# Patient Record
Sex: Female | Born: 1960 | Race: Black or African American | Hispanic: No | Marital: Single | Smoking: Never smoker
Health system: Southern US, Community
[De-identification: ages and names within clinical notes are randomized; demographics above are authoritative.]

## PROBLEM LIST (undated history)

## (undated) ENCOUNTER — Ambulatory Visit (HOSPITAL_COMMUNITY): Payer: Medicaid Other

## (undated) DIAGNOSIS — H548 Legal blindness, as defined in USA: Secondary | ICD-10-CM

## (undated) DIAGNOSIS — H547 Unspecified visual loss: Secondary | ICD-10-CM

## (undated) DIAGNOSIS — E1139 Type 2 diabetes mellitus with other diabetic ophthalmic complication: Secondary | ICD-10-CM

## (undated) DIAGNOSIS — E119 Type 2 diabetes mellitus without complications: Secondary | ICD-10-CM

## (undated) DIAGNOSIS — I10 Essential (primary) hypertension: Secondary | ICD-10-CM

## (undated) HISTORY — PX: EYE SURGERY: SHX253

## (undated) HISTORY — DX: Unspecified visual loss: H54.7

## (undated) HISTORY — DX: Type 2 diabetes mellitus with other diabetic ophthalmic complication: E11.39

## (undated) HISTORY — DX: Legal blindness, as defined in USA: H54.8

## (undated) HISTORY — PX: RETINAL DETACHMENT SURGERY: SHX105

## (undated) HISTORY — DX: Type 2 diabetes mellitus without complications: E11.9

## (undated) HISTORY — PX: LASIK: SHX215

## (undated) HISTORY — DX: Essential (primary) hypertension: I10

---

## 2013-03-25 DIAGNOSIS — M79609 Pain in unspecified limb: Secondary | ICD-10-CM | POA: Insufficient documentation

## 2013-03-25 DIAGNOSIS — M2040 Other hammer toe(s) (acquired), unspecified foot: Secondary | ICD-10-CM | POA: Insufficient documentation

## 2013-03-25 DIAGNOSIS — B351 Tinea unguium: Secondary | ICD-10-CM | POA: Insufficient documentation

## 2013-03-25 DIAGNOSIS — M2010 Hallux valgus (acquired), unspecified foot: Secondary | ICD-10-CM | POA: Insufficient documentation

## 2013-03-25 DIAGNOSIS — L02629 Furuncle of unspecified foot: Secondary | ICD-10-CM | POA: Insufficient documentation

## 2013-03-25 DIAGNOSIS — L97509 Non-pressure chronic ulcer of other part of unspecified foot with unspecified severity: Secondary | ICD-10-CM | POA: Insufficient documentation

## 2013-04-24 DIAGNOSIS — M779 Enthesopathy, unspecified: Secondary | ICD-10-CM | POA: Insufficient documentation

## 2013-04-24 DIAGNOSIS — L989 Disorder of the skin and subcutaneous tissue, unspecified: Secondary | ICD-10-CM | POA: Insufficient documentation

## 2016-02-13 ENCOUNTER — Ambulatory Visit: Payer: Medicaid Other | Attending: Family Medicine | Admitting: Family Medicine

## 2016-02-13 ENCOUNTER — Encounter: Payer: Self-pay | Admitting: Family Medicine

## 2016-02-13 VITALS — BP 139/80 | HR 87 | Temp 98.4°F | Resp 20 | Ht 68.0 in | Wt 222.0 lb

## 2016-02-13 DIAGNOSIS — E119 Type 2 diabetes mellitus without complications: Secondary | ICD-10-CM | POA: Diagnosis not present

## 2016-02-13 DIAGNOSIS — H547 Unspecified visual loss: Secondary | ICD-10-CM | POA: Diagnosis not present

## 2016-02-13 DIAGNOSIS — E782 Mixed hyperlipidemia: Secondary | ICD-10-CM

## 2016-02-13 DIAGNOSIS — I10 Essential (primary) hypertension: Secondary | ICD-10-CM | POA: Insufficient documentation

## 2016-02-13 DIAGNOSIS — E559 Vitamin D deficiency, unspecified: Secondary | ICD-10-CM | POA: Insufficient documentation

## 2016-02-13 DIAGNOSIS — Z0000 Encounter for general adult medical examination without abnormal findings: Secondary | ICD-10-CM

## 2016-02-13 LAB — POCT UA - GLUCOSE/PROTEIN
Glucose, UA: 100
PROTEIN UA: 30

## 2016-02-13 LAB — POCT GLYCOSYLATED HEMOGLOBIN (HGB A1C): Hemoglobin A1C: 8

## 2016-02-13 LAB — GLUCOSE, POCT (MANUAL RESULT ENTRY): POC Glucose: 241 mg/dl — AB (ref 70–99)

## 2016-02-13 MED ORDER — ACCU-CHEK AVIVA PLUS W/DEVICE KIT: 1.0000 "application " | PACK | Freq: Three times a day (TID) | 0 refills | Status: DC

## 2016-02-13 MED ORDER — GLIMEPIRIDE 4 MG PO TABS: 4.0000 mg | ORAL_TABLET | Freq: Every day | ORAL | 3 refills | Status: DC

## 2016-02-13 MED ORDER — METFORMIN HCL 1000 MG PO TABS: 1000.0000 mg | ORAL_TABLET | Freq: Two times a day (BID) | ORAL | 2 refills | Status: DC

## 2016-02-13 MED ORDER — GLUCOSE BLOOD VI STRP: ORAL_STRIP | 12 refills | Status: DC

## 2016-02-13 MED ORDER — ACCU-CHEK SOFTCLIX LANCET DEV MISC: 0 refills | Status: DC

## 2016-02-13 MED ORDER — LISINOPRIL 20 MG PO TABS: 20.0000 mg | ORAL_TABLET | Freq: Every day | ORAL | 2 refills | Status: DC

## 2016-02-13 NOTE — Progress Notes (Signed)
f/u HTN/DM. C/o leg cramps.

## 2016-02-14 ENCOUNTER — Encounter: Payer: Self-pay | Admitting: Family Medicine

## 2016-02-14 DIAGNOSIS — E11319 Type 2 diabetes mellitus with unspecified diabetic retinopathy without macular edema: Secondary | ICD-10-CM | POA: Insufficient documentation

## 2016-02-14 DIAGNOSIS — Z794 Long term (current) use of insulin: Secondary | ICD-10-CM

## 2016-02-14 DIAGNOSIS — I10 Essential (primary) hypertension: Secondary | ICD-10-CM | POA: Insufficient documentation

## 2016-02-14 LAB — LIPID PANEL
CHOL/HDL RATIO: 6 ratio — AB (ref <5.0)
CHOLESTEROL: 193 mg/dL (ref <200)
HDL: 32 mg/dL — AB (ref >50)
TRIGLYCERIDES: 420 mg/dL — AB (ref <150)

## 2016-02-14 LAB — MICROALBUMIN / CREATININE URINE RATIO
Creatinine, Urine: 187 mg/dL (ref 20–320)
MICROALB UR: 9.8 mg/dL
Microalb Creat Ratio: 52 mcg/mg creat — ABNORMAL HIGH (ref <30)

## 2016-02-14 LAB — CBC WITH DIFFERENTIAL/PLATELET

## 2016-02-14 LAB — BASIC METABOLIC PANEL
BUN: 19 mg/dL (ref 7–25)
CALCIUM: 9 mg/dL (ref 8.6–10.4)
CO2: 25 mmol/L (ref 20–31)
Chloride: 100 mmol/L (ref 98–110)
Creat: 0.93 mg/dL (ref 0.50–1.05)
Glucose, Bld: 180 mg/dL — ABNORMAL HIGH (ref 65–99)
POTASSIUM: 3.9 mmol/L (ref 3.5–5.3)
SODIUM: 137 mmol/L (ref 135–146)

## 2016-02-14 LAB — VITAMIN D 25 HYDROXY (VIT D DEFICIENCY, FRACTURES): Vit D, 25-Hydroxy: 19 ng/mL — ABNORMAL LOW (ref 30–100)

## 2016-02-14 LAB — TSH: TSH: 1.1 m[IU]/L

## 2016-02-14 MED ORDER — GEMFIBROZIL 600 MG PO TABS: 600.0000 mg | ORAL_TABLET | Freq: Two times a day (BID) | ORAL | 2 refills | Status: DC

## 2016-02-14 MED ORDER — CALCIUM 600-400 MG-UNIT PO CHEW: 1.0000 | CHEWABLE_TABLET | Freq: Every day | ORAL | 2 refills | Status: DC

## 2016-02-14 NOTE — Progress Notes (Signed)
Subjective:   Patient ID: Lisa Olson, female    DOB: 1961-02-13, 54 y.o.   MRN: [id number]  Chief Complaint  Patient presents with  . Diabetes  . Hypertension   HPI Lisa Olson 55 y.o. female comes to the office to establish care and for medication refills. She has a PHM that includes HTN, DM, and blindness. She reports not having a glucometer at home to measure CBG's. She also reports not having a blood pressure monitor at home for BP monitoring.   Past Medical History:  Diagnosis Date  . Diabetes mellitus without complication (HCC)   . Hypertension   . 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  Social History   Social History  . Marital status: Single    Spouse name: N/A  . Number of children: N/A  . Years of education: N/A   Occupational History  . Patient reports working at [employer] for [employer].    Social History Main Topics  . Smoking status: Never Smoker  . Smokeless tobacco: Never Used  . Alcohol use Not on file  . Drug use: No  . Sexual activity: Not on file    No outpatient prescriptions prior to visit.   No facility-administered medications prior to visit.     No Known Allergies  Review of Systems  Constitutional: Negative.   HENT: Negative.   Eyes: Negative for pain, discharge and redness.       History visual impairment. Patient reports she can see shadows of light only.  Respiratory: Negative.   Cardiovascular: Negative.   Gastrointestinal: Negative.   Genitourinary: Negative.   Musculoskeletal: Negative.   Skin: Negative.   Neurological: Negative.   Psychiatric/Behavioral: Negative for depression. The patient is not nervous/anxious.        Objective:    Physical Exam  Constitutional: She is oriented to person, place, and time. She appears well-developed and well-nourished.  Eyes: Conjunctivae are normal. Right eye exhibits no discharge. Left eye exhibits no discharge.  Blindness.  Neck: Normal range  of motion.  Cardiovascular: Normal rate and regular rhythm.   Pulmonary/Chest: Effort normal and breath sounds normal.  Abdominal: Soft. Bowel sounds are normal.  Musculoskeletal: Normal range of motion. She exhibits no edema or tenderness.  Neurological: She is alert and oriented to person, place, and time.  Skin: Skin is warm and dry.  Psychiatric: She has a normal mood and affect. Her behavior is normal. Judgment and thought content normal.    BP 139/80   Pulse 87   Temp 98.4 F (36.9 C) (Oral)   Resp 20   Ht 5\' 8"  (1.727 m)   Wt 222 lb (100.7 kg)   SpO2 99%   BMI 33.75 kg/m  Wt Readings from Last 3 Encounters:  02/13/16 222 lb (100.7 kg)    Lab Results  Component Value Date   HGBA1C 8.0 02/13/2016       Assessment & Plan:   Problem List Items Addressed This Visit      Cardiovascular and Mediastinum   Essential hypertension   Relevant Medications   lisinopril (PRINIVIL,ZESTRIL) 20 MG tablet   gemfibrozil (LOPID) 600 MG tablet    Encouraged patient to use blood pressure monitor at retail pharmacy and keep log of BP's until she can  get one.   Other Relevant Orders   Basic Metabolic Panel (Completed)   CBC with Differential (Completed)   Urinalysis - Glucose/Protein (Completed)   Lipid Panel (Completed)  Vitamin D, 25-hydroxy (Completed)   Microalbumin/Creatinine Ratio, Urine (Completed)   TSH (Completed)     Endocrine   Type 2 diabetes mellitus without complication (HCC) - Primary   Relevant Medications   lisinopril (PRINIVIL,ZESTRIL) 20 MG tablet   metFORMIN (GLUCOPHAGE) 1000 MG tablet   glucose blood (ACCU-CHEK AVIVA) test strip   Lancet Devices (ACCU-CHEK SOFTCLIX) lancets   Blood Glucose Monitoring Suppl (ACCU-CHEK AVIVA PLUS) w/Device KIT   glimepiride (AMARYL) 4 MG tablet   Other Relevant Orders   POCT glucose (manual entry) (Completed)   HgB A1c (Completed)   Basic Metabolic Panel (Completed)   CBC with Differential (Completed)   Urinalysis -  Glucose/Protein (Completed)   Vitamin D, 25-hydroxy (Completed)   Microalbumin/Creatinine Ratio, Urine (Completed)   Ambulatory referral to Podiatry   Ambulatory referral to Ophthalmology    Other Visit Diagnoses    Healthcare maintenance       Mixed hyperlipidemia       Relevant Medications   lisinopril (PRINIVIL,ZESTRIL) 20 MG tablet   gemfibrozil (LOPID) 600 MG tablet   Vitamin D deficiency       Relevant Medications   Calcium 600-400 MG-UNIT CHEW      I have discontinued Ms. Coldwell's glyBURIDE. I have also changed her lisinopril and metFORMIN. Additionally, I am having her start on glucose blood, accu-chek softclix, ACCU-CHEK AVIVA PLUS, glimepiride, gemfibrozil, and Calcium.  Meds ordered this encounter  Medications  . DISCONTD: metFORMIN (GLUCOPHAGE) 1000 MG tablet    Sig: Take 1,000 mg by mouth 2 (two) times daily with a meal.  . DISCONTD: lisinopril (PRINIVIL,ZESTRIL) 20 MG tablet    Sig: Take 20 mg by mouth daily.  Marland Kitchen DISCONTD: glyBURIDE (DIABETA) 5 MG tablet    Sig: Take 4 mg by mouth daily with breakfast.  . lisinopril (PRINIVIL,ZESTRIL) 20 MG tablet    Sig: Take 1 tablet (20 mg total) by mouth daily.    Dispense:  30 tablet    Refill:  2    Order Specific Question:   Supervising Provider    Answer:   Tresa Garter [id number]  . metFORMIN (GLUCOPHAGE) 1000 MG tablet    Sig: Take 1 tablet (1,000 mg total) by mouth 2 (two) times daily with a meal.    Dispense:  60 tablet    Refill:  2    Order Specific Question:   Supervising Provider    Answer:   Tresa Garter [id number]  . glucose blood (ACCU-CHEK AVIVA) test strip    Sig: Use as instructed    Dispense:  100 each    Refill:  12    Order Specific Question:   Supervising Provider    Answer:   Tresa Garter [[id number]]  . Lancet Devices (ACCU-CHEK SOFTCLIX) lancets    Sig: Use as instructed    Dispense:  1 each    Refill:  0    Order Specific Question:   Supervising Provider    Answer:   Tresa Garter [id number]  . Blood Glucose Monitoring Suppl (ACCU-CHEK AVIVA PLUS) w/Device KIT    Sig: 1 application by Does not apply route 4 (four) times daily -  before meals and at bedtime.    Dispense:  1 kit    Refill:  0    Order Specific Question:   Supervising Provider    Answer:   Tresa Garter [id number]  . glimepiride (AMARYL) 4 MG tablet    Sig: Take 1 tablet (4 mg total) by mouth  daily before breakfast.    Dispense:  30 tablet    Refill:  3    Order Specific Question:   Supervising Provider    Answer:   Tresa Garter [id number]  . gemfibrozil (LOPID) 600 MG tablet    Sig: Take 1 tablet (600 mg total) by mouth 2 (two) times daily before a meal.    Dispense:  60 tablet    Refill:  2    Order Specific Question:   Supervising Provider    Answer:   Tresa Garter [id number]  . Calcium 600-400 MG-UNIT CHEW    Sig: Chew 1 tablet by mouth daily.    Dispense:  60 tablet    Refill:  2    Order Specific Question:   Supervising Provider    Answer:   Tresa Garter [id number]   Follow up: 3 months  Fredia Beets, FNP

## 2016-02-15 ENCOUNTER — Encounter: Payer: Self-pay | Admitting: Family Medicine

## 2016-02-15 ENCOUNTER — Telehealth: Payer: Self-pay

## 2016-02-15 NOTE — Telephone Encounter (Addendum)
Patient fully hipaa verified per guidelines. RN advised patient per Lisa BarkMandesia R Hairston, FNP -Microalbumin/creatinine ratio level was high. This tests for protein in your urine that can indicate early signs of kidney damage.  Continue to take lisinopril. Will recheck levels again in 3 months. -Triglycerides level was high. High Triglycerides can increase your risk of heart disease. Lopid was prescribed for you to lower  your levels. Will recheck levels again in 3 months. -Vitamin D level was low. Vitamin D helps to keep bones strong. You were prescribed a vitamin-d supplement to increase your  levels. -Will need to recollect CBC lab at next visit due to clotted specimen. - Hgb A1c was 8.0. Continue Eat a carbohydrate modified, low salt diet.  -[facility].   Patient verbalized understanding.  No further questions at this time.

## 2016-02-15 NOTE — Telephone Encounter (Signed)
Requesting referral Home health aide to assist with daily activities

## 2016-02-15 NOTE — Progress Notes (Signed)
Call patient requested referral for [facility]: [phone] in [address],[address], [place], [place] [id number].

## 2016-02-15 NOTE — Telephone Encounter (Signed)
Order was placed for referral to Home Health.

## 2016-02-20 ENCOUNTER — Other Ambulatory Visit: Payer: Self-pay | Admitting: Pharmacist

## 2016-02-20 DIAGNOSIS — I10 Essential (primary) hypertension: Secondary | ICD-10-CM

## 2016-02-20 DIAGNOSIS — E119 Type 2 diabetes mellitus without complications: Secondary | ICD-10-CM

## 2016-02-20 MED ORDER — ACCU-CHEK SOFTCLIX LANCET DEV MISC: 0 refills | Status: DC

## 2016-02-20 MED ORDER — LISINOPRIL 20 MG PO TABS: 20.0000 mg | ORAL_TABLET | Freq: Every day | ORAL | 2 refills | Status: DC

## 2016-02-20 MED ORDER — METFORMIN HCL 1000 MG PO TABS: 1000.0000 mg | ORAL_TABLET | Freq: Two times a day (BID) | ORAL | 2 refills | Status: DC

## 2016-02-20 MED ORDER — GLUCOSE BLOOD VI STRP: ORAL_STRIP | 12 refills | Status: DC

## 2016-02-22 ENCOUNTER — Telehealth: Payer: Self-pay | Admitting: Family Medicine

## 2016-02-22 ENCOUNTER — Ambulatory Visit: Payer: Self-pay | Admitting: Podiatry

## 2016-02-22 NOTE — Telephone Encounter (Signed)
Patient states she has a prodigy meter and needs lancets and strips for this meter....  And needs refill for glimepiride (AMARYL) 4 MG tablet [[id number]][[id number]]   Please send [facility] on [address]....  Please follow up

## 2016-02-22 NOTE — Telephone Encounter (Signed)
At her last visit on 11/27 I gave her 3 refills on her glimepiride and I also placed orders for her to be able to obtain a glucose meter, with strips and lancets from our pharmacy.

## 2016-02-23 ENCOUNTER — Other Ambulatory Visit: Payer: Self-pay | Admitting: Pharmacist

## 2016-02-23 ENCOUNTER — Other Ambulatory Visit: Payer: Self-pay | Admitting: Family Medicine

## 2016-02-23 DIAGNOSIS — E782 Mixed hyperlipidemia: Secondary | ICD-10-CM

## 2016-02-23 DIAGNOSIS — E559 Vitamin D deficiency, unspecified: Secondary | ICD-10-CM

## 2016-02-23 DIAGNOSIS — E119 Type 2 diabetes mellitus without complications: Secondary | ICD-10-CM

## 2016-02-23 DIAGNOSIS — I10 Essential (primary) hypertension: Secondary | ICD-10-CM

## 2016-02-23 MED ORDER — GEMFIBROZIL 600 MG PO TABS: 600.0000 mg | ORAL_TABLET | Freq: Two times a day (BID) | ORAL | 2 refills | Status: DC

## 2016-02-23 MED ORDER — LISINOPRIL 20 MG PO TABS: 20.0000 mg | ORAL_TABLET | Freq: Every day | ORAL | 2 refills | Status: DC

## 2016-02-23 MED ORDER — PRODIGY SAFETY LANCETS 26G MISC: 1.0000 | Freq: Once | 12 refills | Status: AC

## 2016-02-23 MED ORDER — GLUCOSE BLOOD VI STRP: ORAL_STRIP | 12 refills | Status: DC

## 2016-02-23 MED ORDER — CALCIUM 600-400 MG-UNIT PO CHEW: 1.0000 | CHEWABLE_TABLET | Freq: Every day | ORAL | 2 refills | Status: DC

## 2016-02-23 MED ORDER — METFORMIN HCL 1000 MG PO TABS: 1000.0000 mg | ORAL_TABLET | Freq: Two times a day (BID) | ORAL | 2 refills | Status: DC

## 2016-02-23 MED ORDER — GLIMEPIRIDE 4 MG PO TABS: 4.0000 mg | ORAL_TABLET | Freq: Every day | ORAL | 3 refills | Status: DC

## 2016-02-29 ENCOUNTER — Ambulatory Visit: Payer: Self-pay | Admitting: Podiatry

## 2016-03-01 ENCOUNTER — Telehealth: Payer: Self-pay | Admitting: Family Medicine

## 2016-03-01 NOTE — Telephone Encounter (Signed)
Patient called the office to speak with PCP regarding her meter. Pt stated that she has the Prodigy meter and therefore the strips that were sent to the pharmacy are wrong. Please follow up.  Thank you.

## 2016-03-02 ENCOUNTER — Other Ambulatory Visit: Payer: Self-pay | Admitting: Family Medicine

## 2016-03-02 MED ORDER — GLUCOSE BLOOD VI STRP: ORAL_STRIP | 12 refills | Status: DC

## 2016-03-02 NOTE — Telephone Encounter (Signed)
There were two different types of Prodigy strips listed within EPIC. I have sent the Prodigy No Coding strip [facility].'s chart.

## 2016-03-05 NOTE — Telephone Encounter (Signed)
Patient called the office to inform us that she went to pick up the new strips but that she had to pay out of pocket because her insurance won't cover it. Pharmacy informed her that Medicaid will cover Accu Check meter. Pt is now requesting one. [facility] on [address]. Please follow up.   Thank you.

## 2016-03-06 MED ORDER — GLUCOSE BLOOD VI STRP: ORAL_STRIP | 12 refills | Status: DC

## 2016-03-06 MED ORDER — ACCU-CHEK SOFTCLIX LANCETS MISC: 12 refills | Status: DC

## 2016-03-06 MED ORDER — ACCU-CHEK AVIVA PLUS W/DEVICE KIT: PACK | 0 refills | Status: DC

## 2016-03-06 MED ORDER — ACCU-CHEK SOFTCLIX LANCET DEV MISC: 0 refills | Status: DC

## 2016-03-06 NOTE — Telephone Encounter (Signed)
Accu-chek blood glucose monitoring supplies sent to [facility].

## 2016-03-14 ENCOUNTER — Encounter: Payer: Self-pay | Admitting: Podiatry

## 2016-03-14 ENCOUNTER — Ambulatory Visit (INDEPENDENT_AMBULATORY_CARE_PROVIDER_SITE_OTHER): Payer: Medicaid Other | Admitting: Podiatry

## 2016-03-14 VITALS — BP 145/88 | HR 97 | Resp 18

## 2016-03-14 DIAGNOSIS — B351 Tinea unguium: Secondary | ICD-10-CM | POA: Diagnosis not present

## 2016-03-14 DIAGNOSIS — M79675 Pain in left toe(s): Secondary | ICD-10-CM | POA: Diagnosis not present

## 2016-03-14 DIAGNOSIS — M79674 Pain in right toe(s): Secondary | ICD-10-CM | POA: Diagnosis not present

## 2016-03-14 NOTE — Progress Notes (Signed)
   Subjective:    Patient ID: Lisa Olson, female    DOB: [birth year], 55 y.o.   MRN: [id number]  HPI     This patient presents today requesting trimming of her toenails that are are extremely uncomfortable walking wearing shoes. Patient describes some previous podiatric care approximately a year ago, however, denies any recent care or self-care. She also describes a general soreness, tenderness, burning, tingling and throbbing on and off weightbearing for many months. She denies any specific treatment for these symptoms  Patient is a diabetic and denies any history of foot ulceration, claudication or amputation    Review of Systems  Eyes:       Legally blind  All other systems reviewed and are negative.      Objective:   Physical Exam  Orientated 3  Vascular: DP and PT pulses 2/4 bilaterally Capillary reflex immediate bilaterally  Neurological: Sensation to 10 g monofilament wire intact 4/5 right and 3/5 left Vibratory sensation reactive bilaterally Ankle reflexes reactive bilaterally  Dermatological: No open skin lesions bilaterally The toenails are extremely elongated curvilinear around the distal toes with hypertrophy, texture and color changes and tenderness to direct palpation  Musculoskeletal: Manual motor testing dorsi flexion, plantar flexion, inversion, eversion 5/5 bilaterally Patient appears to have stable gait      Assessment & Plan:   Assessment: Diabetic with symptoms of peripheral neuropathy Symptomatic onychomycoses 6-10  Plan: I reviewed the results of exam with patient today recommended debridement of toenails. Patient verbally consents. The toenails 6-10 were debrided mechanically and elect without any bleeding  Reappoint at three-month intervals Please note patient at discharge asked my assistant about diabetic shoes.

## 2016-03-14 NOTE — Patient Instructions (Signed)

## 2016-04-06 ENCOUNTER — Other Ambulatory Visit: Payer: Self-pay | Admitting: *Deleted

## 2016-04-06 DIAGNOSIS — E119 Type 2 diabetes mellitus without complications: Secondary | ICD-10-CM

## 2016-04-06 MED ORDER — GLIMEPIRIDE 4 MG PO TABS: 4.0000 mg | ORAL_TABLET | Freq: Every day | ORAL | 3 refills | Status: DC

## 2016-04-06 NOTE — Telephone Encounter (Signed)
Patient verified DOB Patient is aware of glimepiride being refilled and patient reiterated the instructions of taking 1 times before breakfast. MA informed PCP of patient taking 2 tablets daily. Patient advised to come in for a medication management with stacey. Patient will call Monday to have better knowledge of her work schedule.

## 2016-05-14 ENCOUNTER — Ambulatory Visit: Payer: Medicaid Other | Attending: Family Medicine | Admitting: Family Medicine

## 2016-05-14 VITALS — BP 142/79 | HR 86 | Temp 98.3°F | Resp 18 | Ht 67.0 in | Wt 213.4 lb

## 2016-05-14 DIAGNOSIS — B351 Tinea unguium: Secondary | ICD-10-CM | POA: Insufficient documentation

## 2016-05-14 DIAGNOSIS — Z7984 Long term (current) use of oral hypoglycemic drugs: Secondary | ICD-10-CM | POA: Insufficient documentation

## 2016-05-14 DIAGNOSIS — M62830 Muscle spasm of back: Secondary | ICD-10-CM

## 2016-05-14 DIAGNOSIS — E11319 Type 2 diabetes mellitus with unspecified diabetic retinopathy without macular edema: Secondary | ICD-10-CM | POA: Diagnosis not present

## 2016-05-14 DIAGNOSIS — E118 Type 2 diabetes mellitus with unspecified complications: Secondary | ICD-10-CM

## 2016-05-14 DIAGNOSIS — H547 Unspecified visual loss: Secondary | ICD-10-CM | POA: Insufficient documentation

## 2016-05-14 LAB — HEPATIC FUNCTION PANEL
ALK PHOS: 61 U/L (ref 33–130)
ALT: 13 U/L (ref 6–29)
AST: 17 U/L (ref 10–35)
Albumin: 4 g/dL (ref 3.6–5.1)
BILIRUBIN INDIRECT: 0.2 mg/dL (ref 0.2–1.2)
BILIRUBIN TOTAL: 0.3 mg/dL (ref 0.2–1.2)
Bilirubin, Direct: 0.1 mg/dL (ref <=0.2)
TOTAL PROTEIN: 7.7 g/dL (ref 6.1–8.1)

## 2016-05-14 LAB — POCT GLYCOSYLATED HEMOGLOBIN (HGB A1C): HEMOGLOBIN A1C: 6.9

## 2016-05-14 LAB — GLUCOSE, POCT (MANUAL RESULT ENTRY): POC GLUCOSE: 121 mg/dL — AB (ref 70–99)

## 2016-05-14 LAB — LIPID PANEL
Cholesterol: 155 mg/dL (ref <200)
HDL: 33 mg/dL — AB (ref >50)
LDL CALC: 78 mg/dL (ref <100)
Total CHOL/HDL Ratio: 4.7 Ratio (ref <5.0)
Triglycerides: 222 mg/dL — ABNORMAL HIGH (ref <150)
VLDL: 44 mg/dL — ABNORMAL HIGH (ref <30)

## 2016-05-14 MED ORDER — GLUCOSE BLOOD VI STRP: ORAL_STRIP | 12 refills | Status: DC

## 2016-05-14 MED ORDER — ACCU-CHEK SOFTCLIX LANCETS MISC: 12 refills | Status: DC

## 2016-05-14 MED ORDER — GLIMEPIRIDE 4 MG PO TABS: 4.0000 mg | ORAL_TABLET | Freq: Every day | ORAL | 2 refills | Status: DC

## 2016-05-14 MED ORDER — LISINOPRIL 30 MG PO TABS: 30.0000 mg | ORAL_TABLET | Freq: Every day | ORAL | 2 refills | Status: DC

## 2016-05-14 MED ORDER — ACCU-CHEK AVIVA PLUS W/DEVICE KIT: PACK | 0 refills | Status: DC

## 2016-05-14 MED ORDER — CYCLOBENZAPRINE HCL 5 MG PO TABS: 10.0000 mg | ORAL_TABLET | Freq: Three times a day (TID) | ORAL | 0 refills | Status: DC | PRN

## 2016-05-14 NOTE — Progress Notes (Signed)
Patient is here for back pain & discomfort when eating   Patient has eaten today and has taking her meds today

## 2016-05-14 NOTE — Progress Notes (Signed)
Subjective:  Patient ID: Lisa Olson, female    DOB: 1960-04-08  Age: 56 y.o. MRN: [id number]  CC: No chief complaint on file.   HPI Leeann Bady presents for   DM: Report being compliant with taking her diabetic medications and recently incorporating exercise. Denies any episodes of hypoglycemia. History of diabetic retinopathy impairment. Difficulty being able to afford her diabetic supplies for her current meter .  Back spasm: For over a week. She denies any history of back injury or pain. She denies any paresthesias.   Outpatient Medications Prior to Visit  Medication Sig Dispense Refill  . Calcium 600-400 MG-UNIT CHEW Chew 1 tablet by mouth daily. 60 tablet 2  . Lancet Devices (ACCU-CHEK SOFTCLIX) lancets Use as instructed 1 each 0  . metFORMIN (GLUCOPHAGE) 1000 MG tablet Take 1 tablet (1,000 mg total) by mouth 2 (two) times daily with a meal. 60 tablet 2  . ACCU-CHEK SOFTCLIX LANCETS lancets Use as instructed 100 each 12  . Blood Glucose Monitoring Suppl (ACCU-CHEK AVIVA PLUS) w/Device KIT Use as directed 1 kit 0  . gemfibrozil (LOPID) 600 MG tablet Take 1 tablet (600 mg total) by mouth 2 (two) times daily before a meal. 60 tablet 2  . glimepiride (AMARYL) 4 MG tablet Take 1 tablet (4 mg total) by mouth daily before breakfast. 30 tablet 3  . glucose blood (ACCU-CHEK AVIVA PLUS) test strip Use as instructed 100 each 12  . lisinopril (PRINIVIL,ZESTRIL) 20 MG tablet Take 1 tablet (20 mg total) by mouth daily. 30 tablet 2   No facility-administered medications prior to visit.     ROS Review of Systems  Eyes:       History of diabetic retinopathy w/ blindness.  Respiratory: Negative.   Cardiovascular: Negative.   Gastrointestinal: Negative.   Endocrine: Negative.   Skin: Negative.      Objective:  BP (!) 142/79 (BP Location: Left Arm, Patient Position: Sitting, Cuff Size: Normal)   Pulse 86   Temp 98.3 F (36.8 C) (Oral)   Resp 18   Ht 5' 7"  (1.702 m)   Wt 213 lb 6.4 oz  (96.8 kg)   SpO2 97%   BMI 33.42 kg/m   BP/Weight 05/14/2016 03/14/2016 51/70/0174  Systolic BP 944 967 591  Diastolic BP 79 88 80  Wt. (Lbs) 213.4 - 222  BMI 33.42 - 33.75     Physical Exam  Eyes:  Bilateral blindness  Cardiovascular: Normal rate, regular rhythm, normal heart sounds and intact distal pulses.   Pulmonary/Chest: Effort normal and breath sounds normal.  Abdominal: Soft. Bowel sounds are normal.  Skin: Skin is warm and dry.  Thickened, brown discolored toenails.   Nursing note and vitals reviewed.   Diabetic Foot Exam - Simple   Simple Foot Form Diabetic Foot exam was performed with the following findings:  Yes 05/14/2016  3:46 PM  Visual Inspection See comments:  Yes Sensation Testing Intact to touch and monofilament testing bilaterally:  Yes Pulse Check Posterior Tibialis and Dorsalis pulse intact bilaterally:  Yes Comments Thickened, brown discolored toenails.     Assessment & Plan:   Problem List Items Addressed This Visit    None    Visit Diagnoses    Controlled type 2 diabetes mellitus with complication, without long-term current use of insulin ([place])    -  Primary   Relevant Medications   lisinopril (PRINIVIL,ZESTRIL) 30 MG tablet   glucose blood (ACCU-CHEK AVIVA PLUS) test strip   Blood Glucose Monitoring Suppl (ACCU-CHEK AVIVA  PLUS) w/Device KIT   ACCU-CHEK SOFTCLIX LANCETS lancets   glimepiride (AMARYL) 4 MG tablet   Other Relevant Orders   Lipid Panel (Completed)   Hepatic Function Panel (Completed)   Microalbumin/Creatinine Ratio, Urine (Completed)   HgB A1c (Completed)   Glucose (CBG) (Completed)   Muscle spasm of back       Relevant Medications   cyclobenzaprine (FLEXERIL) 5 MG tablet   Onychomycosis       Relevant Orders   Lipid Panel (Completed)   Hepatic Function Panel (Completed)      Meds ordered this encounter  Medications  . cyclobenzaprine (FLEXERIL) 5 MG tablet    Sig: Take 2 tablets (10 mg total) by mouth 3  (three) times daily as needed for muscle spasms.    Dispense:  30 tablet    Refill:  0    Order Specific Question:   Supervising Provider    Answer:   Tresa Garter [id number]  . lisinopril (PRINIVIL,ZESTRIL) 30 MG tablet    Sig: Take 1 tablet (30 mg total) by mouth daily.    Dispense:  30 tablet    Refill:  2    Order Specific Question:   Supervising Provider    Answer:   Tresa Garter [id number]  . glucose blood (ACCU-CHEK AVIVA PLUS) test strip    Sig: Use as instructed    Dispense:  100 each    Refill:  12    Order Specific Question:   Supervising Provider    Answer:   Tresa Garter [[id number]]  . Blood Glucose Monitoring Suppl (ACCU-CHEK AVIVA PLUS) w/Device KIT    Sig: Use as directed    Dispense:  1 kit    Refill:  0    Order Specific Question:   Supervising Provider    Answer:   Tresa Garter [id number]  . ACCU-CHEK SOFTCLIX LANCETS lancets    Sig: Use as instructed    Dispense:  100 each    Refill:  12    Order Specific Question:   Supervising Provider    Answer:   Tresa Garter [[id number]]  . DISCONTD: glimepiride (AMARYL) 4 MG tablet    Sig: Take 1 tablet (4 mg total) by mouth daily with breakfast.    Dispense:  30 tablet    Refill:  2    Order Specific Question:   Supervising Provider    Answer:   Tresa Garter [id number]  . glimepiride (AMARYL) 4 MG tablet    Sig: Take 1 tablet (4 mg total) by mouth daily with breakfast.    Dispense:  30 tablet    Refill:  2    Order Specific Question:   Supervising Provider    Answer:   Tresa Garter [[id number]]    Follow-up: Return in about 3 months (around 08/11/2016) for Diabetes.   Alfonse Spruce FNP

## 2016-05-14 NOTE — Patient Instructions (Addendum)
If liver function results are Lasimil will be prescribed.   Fungal Nail Infection Introduction Fungal nail infection is a common fungal infection of the toenails or fingernails. This condition affects toenails more often than fingernails. More than one nail may be infected. The condition can be passed from person to person (is contagious). What are the causes? This condition is caused by a fungus. Several types of funguses can cause the infection. These funguses are common in moist and warm areas. If your hands or feet come into contact with the fungus, it may get into a crack in your fingernail or toenail and cause the infection. What increases the risk? The following factors may make you more likely to develop this condition:  Being female.  Having diabetes.  Being of older age.  Living with someone who has the fungus.  Walking barefoot in areas where the fungus thrives, such as showers or locker rooms.  Having poor circulation.  Wearing shoes and socks that cause your feet to sweat.  Having athlete's foot.  Having a nail injury or history of a recent nail surgery.  Having psoriasis.  Having a weak body defense system (immune system). What are the signs or symptoms? Symptoms of this condition include:  A pale spot on the nail.  Thickening of the nail.  A nail that becomes yellow or brown.  A brittle or ragged nail edge.  A crumbling nail.  A nail that has lifted away from the nail bed. How is this diagnosed? This condition is diagnosed with a physical exam. Your health care provider may take a scraping or clipping from your nail to test for the fungus. How is this treated? Mild infections do not need treatment. If you have significant nail changes, treatment may include:  Oral antifungal medicines. You may need to take the medicine for several weeks or several months, and you may not see the results for a long time. These medicines can cause side effects. Ask your  health care provider what problems to watch for.  Antifungal nail polish and nail cream. These may be used along with oral antifungal medicines.  Laser treatment of the nail.  Surgery to remove the nail. This may be needed for the most severe infections. Treatment takes a long time, and the infection may come back. Follow these instructions at home: Medicines  Take or apply over-the-counter and prescription medicines only as told by your health care provider.  Ask your health care provider about using over-the-counter mentholated ointment on your nails. Lifestyle  Do not share personal items, such as towels or nail clippers.  Trim your nails often.  Wash and dry your hands and feet every day.  Wear absorbent socks, and change your socks frequently.  Wear shoes that allow air to circulate, such as sandals or canvas tennis shoes. Throw out old shoes.  Wear rubber gloves if you are working with your hands in wet areas.  Do not walk barefoot in shower rooms or locker rooms.  Do not use a nail salon that does not use clean instruments.  Do not use artificial nails. General instructions  Keep all follow-up visits as told by your health care provider. This is important.  Use antifungal foot powder on your feet and in your shoes. Contact a health care provider if: Your infection is not getting better or it is getting worse after several months. This information is not intended to replace advice given to you by your health care provider. Make sure you  discuss any questions you have with your health care provider. Document Released: 03/02/2000 Document Revised: 08/11/2015 Document Reviewed: 09/06/2014  2017 Elsevier Cyclobenzaprine tablets What is this medicine? CYCLOBENZAPRINE (sye kloe BEN za preen) is a muscle relaxer. It is used to treat muscle pain, spasms, and stiffness. This medicine may be used for other purposes; ask your health care provider or pharmacist if you have  questions. COMMON BRAND NAME(S): Fexmid, Flexeril What should I tell my health care provider before I take this medicine? They need to know if you have any of these conditions: -heart disease, irregular heartbeat, or previous heart attack -liver disease -thyroid problem -an unusual or allergic reaction to cyclobenzaprine, tricyclic antidepressants, lactose, other medicines, foods, dyes, or preservatives -pregnant or trying to get pregnant -breast-feeding How should I use this medicine? Take this medicine by mouth with a glass of water. Follow the directions on the prescription label. If this medicine upsets your stomach, take it with food or milk. Take your medicine at regular intervals. Do not take it more often than directed. Talk to your pediatrician regarding the use of this medicine in children. Special care may be needed. Overdosage: If you think you have taken too much of this medicine contact a poison control center or emergency room at once. NOTE: This medicine is only for you. Do not share this medicine with others. What if I miss a dose? If you miss a dose, take it as soon as you can. If it is almost time for your next dose, take only that dose. Do not take double or extra doses. What may interact with this medicine? Do not take this medicine with any of the following medications: -certain medicines for fungal infections like fluconazole, itraconazole, ketoconazole, posaconazole, voriconazole -cisapride -dofetilide -dronedarone -halofantrine -levomethadyl -MAOIs like Carbex, Eldepryl, Marplan, Nardil, and Parnate -narcotic medicines for cough -pimozide -thioridazine -ziprasidone This medicine may also interact with the following medications: -alcohol -antihistamines for allergy, cough and cold -certain medicines for anxiety or sleep -certain medicines for cancer -certain medicines for depression like amitriptyline, fluoxetine, sertraline -certain medicines for infection  like alfuzosin, chloroquine, clarithromycin, levofloxacin, mefloquine, pentamidine, troleandomycin -certain medicines for irregular heart beat -certain medicines for seizures like phenobarbital, primidone -contrast dyes -general anesthetics like halothane, isoflurane, methoxyflurane, propofol -local anesthetics like lidocaine, pramoxine, tetracaine -medicines that relax muscles for surgery -narcotic medicines for pain -other medicines that prolong the QT interval (cause an abnormal heart rhythm) -phenothiazines like chlorpromazine, mesoridazine, prochlorperazine This list may not describe all possible interactions. Give your health care provider a list of all the medicines, herbs, non-prescription drugs, or dietary supplements you use. Also tell them if you smoke, drink alcohol, or use illegal drugs. Some items may interact with your medicine. What should I watch for while using this medicine? Tell your doctor or health care professional if your symptoms do not start to get better or if they get worse. You may get drowsy or dizzy. Do not drive, use machinery, or do anything that needs mental alertness until you know how this medicine affects you. Do not stand or sit up quickly, especially if you are an older patient. This reduces the risk of dizzy or fainting spells. Alcohol may interfere with the effect of this medicine. Avoid alcoholic drinks. If you are taking another medicine that also causes drowsiness, you may have more side effects. Give your health care provider a list of all medicines you use. Your doctor will tell you how much medicine to take. Do not take  more medicine than directed. Call emergency for help if you have problems breathing or unusual sleepiness. Your mouth may get dry. Chewing sugarless gum or sucking hard candy, and drinking plenty of water may help. Contact your doctor if the problem does not go away or is severe. What side effects may I notice from receiving this  medicine? Side effects that you should report to your doctor or health care professional as soon as possible: -allergic reactions like skin rash, itching or hives, swelling of the face, lips, or tongue -breathing problems -chest pain -fast, irregular heartbeat -hallucinations -seizures -unusually weak or tired Side effects that usually do not require medical attention (report to your doctor or health care professional if they continue or are bothersome): -headache -nausea, vomiting This list may not describe all possible side effects. Call your doctor for medical advice about side effects. You may report side effects to FDA at 1-800-FDA-1088. Where should I keep my medicine? Keep out of the reach of children. Store at room temperature between 15 and 30 degrees C (59 and 86 degrees F). Keep container tightly closed. Throw away any unused medicine after the expiration date. NOTE: This sheet is a summary. It may not cover all possible information. If you have questions about this medicine, talk to your doctor, pharmacist, or health care provider.  2017 Elsevier/Gold Standard (2014-12-14 12:05:46)  Muscle Cramps and Spasms Muscle cramps and spasms are when muscles tighten by themselves. They usually get better within minutes. Muscle cramps are painful. They are usually stronger and last longer than muscle spasms. Muscle spasms may or may not be painful. They can last a few seconds or much longer. HOME CARE  Drink enough fluid to keep your pee (urine) clear or pale yellow.  Massage, stretch, and relax the muscle.  Use a warm towel, heating pad, or warm shower water on tight muscles.  Place ice on the muscle if it is tender or in pain.  Put ice in a plastic bag.  Place a towel between your skin and the bag.  Leave the ice on for 15-20 minutes, 3-4 times a day.  Only take medicine as told by your doctor. GET HELP RIGHT AWAY IF:  Your cramps or spasms get worse, happen more often, or  do not get better with time. MAKE SURE YOU:  Understand these instructions.  Will watch your condition.  Will get help right away if you are not doing well or get worse. This information is not intended to replace advice given to you by your health care provider. Make sure you discuss any questions you have with your health care provider. Document Released: 02/16/2008 Document Revised: 06/30/2012 Document Reviewed: 12/07/2014 Elsevier Interactive Patient Education  2017 Else[employer] Inc.  Lisinopril tablets What is this medicine? LISINOPRIL (lyse IN oh pril) is an ACE inhibitor. This medicine is used to treat high blood pressure and heart failure. It is also used to protect the heart immediately after a heart attack. This medicine may be used for other purposes; ask your health care provider or pharmacist if you have questions. COMMON BRAND NAME(S): Prinivil, Zestril What should I tell my health care provider before I take this medicine? They need to know if you have any of these conditions: -diabetes -heart or blood vessel disease -kidney disease -low blood pressure -previous swelling of the tongue, face, or lips with difficulty breathing, difficulty swallowing, hoarseness, or tightening of the throat -an unusual or allergic reaction to lisinopril, other ACE inhibitors, insect venom, foods,  dyes, or preservatives -pregnant or trying to get pregnant -breast-feeding How should I use this medicine? Take this medicine by mouth with a glass of water. Follow the directions on your prescription label. You may take this medicine with or without food. If it upsets your stomach, take it with food. Take your medicine at regular intervals. Do not take it more often than directed. Do not stop taking except on your doctor's advice. Talk to your pediatrician regarding the use of this medicine in children. Special care may be needed. While this drug may be prescribed for children as young as 6 years of  age for selected conditions, precautions do apply. Overdosage: If you think you have taken too much of this medicine contact a poison control center or emergency room at once. NOTE: This medicine is only for you. Do not share this medicine with others. What if I miss a dose? If you miss a dose, take it as soon as you can. If it is almost time for your next dose, take only that dose. Do not take double or extra doses. What may interact with this medicine? Do not take this medicine with any of the following medications: -hymenoptera venom -sacubitril; valsartan This medicines may also interact with the following medications: -aliskiren -angiotensin receptor blockers, like losartan or valsartan -certain medicines for diabetes -diuretics -everolimus -gold compounds -lithium -NSAIDs, medicines for pain and inflammation, like ibuprofen or naproxen -potassium salts or supplements -salt substitutes -sirolimus -temsirolimus This list may not describe all possible interactions. Give your health care provider a list of all the medicines, herbs, non-prescription drugs, or dietary supplements you use. Also tell them if you smoke, drink alcohol, or use illegal drugs. Some items may interact with your medicine. What should I watch for while using this medicine? Visit your doctor or health care professional for regular check ups. Check your blood pressure as directed. Ask your doctor what your blood pressure should be, and when you should contact him or her. Do not treat yourself for coughs, colds, or pain while you are using this medicine without asking your doctor or health care professional for advice. Some ingredients may increase your blood pressure. Women should inform their doctor if they wish to become pregnant or think they might be pregnant. There is a potential for serious side effects to an unborn child. Talk to your health care professional or pharmacist for more information. Check with your  doctor or health care professional if you get an attack of severe diarrhea, nausea and vomiting, or if you sweat a lot. The loss of too much body fluid can make it dangerous for you to take this medicine. You may get drowsy or dizzy. Do not drive, use machinery, or do anything that needs mental alertness until you know how this drug affects you. Do not stand or sit up quickly, especially if you are an older patient. This reduces the risk of dizzy or fainting spells. Alcohol can make you more drowsy and dizzy. Avoid alcoholic drinks. Avoid salt substitutes unless you are told otherwise by your doctor or health care professional. What side effects may I notice from receiving this medicine? Side effects that you should report to your doctor or health care professional as soon as possible: -allergic reactions like skin rash, itching or hives, swelling of the hands, feet, face, lips, throat, or tongue -breathing problems -signs and symptoms of kidney injury like trouble passing urine or change in the amount of urine -signs and symptoms of increased  potassium like muscle weakness; chest pain; or fast, irregular heartbeat -signs and symptoms of liver injury like dark yellow or brown urine; general ill feeling or flu-like symptoms; light-colored stools; loss of appetite; nausea; right upper belly pain; unusually weak or tired; yellowing of the eyes or skin -signs and symptoms of low blood pressure like dizziness; feeling faint or lightheaded, falls; unusually weak or tired -stomach pain with or without nausea and vomiting Side effects that usually do not require medical attention (report to your doctor or health care professional if they continue or are bothersome): -changes in taste -cough -dizziness -fever -headache -sensitivity to light This list may not describe all possible side effects. Call your doctor for medical advice about side effects. You may report side effects to FDA at  1-800-FDA-1088. Where should I keep my medicine? Keep out of the reach of children. Store at room temperature between 15 and 30 degrees C (59 and 86 degrees F). Protect from moisture. Keep container tightly closed. Throw away any unused medicine after the expiration date. NOTE: This sheet is a summary. It may not cover all possible information. If you have questions about this medicine, talk to your doctor, pharmacist, or health care provider.  2017 Elsevier/Gold Standard (2015-04-25 12:52:35)

## 2016-05-15 LAB — MICROALBUMIN / CREATININE URINE RATIO
CREATININE, URINE: 227 mg/dL (ref 20–320)
MICROALB UR: 6.9 mg/dL
Microalb Creat Ratio: 30 mcg/mg creat — ABNORMAL HIGH (ref <30)

## 2016-05-17 ENCOUNTER — Other Ambulatory Visit: Payer: Self-pay | Admitting: Family Medicine

## 2016-05-17 DIAGNOSIS — E782 Mixed hyperlipidemia: Secondary | ICD-10-CM

## 2016-05-21 ENCOUNTER — Other Ambulatory Visit: Payer: Self-pay | Admitting: Family Medicine

## 2016-05-21 DIAGNOSIS — R809 Proteinuria, unspecified: Secondary | ICD-10-CM

## 2016-05-21 DIAGNOSIS — Z5181 Encounter for therapeutic drug level monitoring: Secondary | ICD-10-CM

## 2016-05-21 DIAGNOSIS — E782 Mixed hyperlipidemia: Secondary | ICD-10-CM

## 2016-05-21 MED ORDER — ATORVASTATIN CALCIUM 20 MG PO TABS: 20.0000 mg | ORAL_TABLET | Freq: Every day | ORAL | 2 refills | Status: DC

## 2016-05-22 ENCOUNTER — Telehealth: Payer: Self-pay | Admitting: Family Medicine

## 2016-05-22 ENCOUNTER — Telehealth: Payer: Self-pay

## 2016-05-22 NOTE — Telephone Encounter (Signed)
-----   Message from Lizbeth BarkMandesia R Hairston, [place] sent at 05/21/2016 12:39 PM EST ----- -Lipid levels lower than previous visit but were still elevated. This can increase your risk of heart disease. You have been prescribed atorvastatin to help lower your risk. Recommend recheck in 3 months.   -Liver function normal. If you would like to start Lamisil for nail fungus let the office know. Recommend recheck 1 month after therapy and every 3 months if taking this medication. -Microalbumin/creatinine ratio level was lower than last visit but still elevated. This tests for protein in your urine that can indicate early signs of kidney damage. Continue to take your lisinopril as prescribed. Recommend recheck in 3 months.

## 2016-05-22 NOTE — Telephone Encounter (Signed)
CMA call to go over lab results  Patient did not answer but CMA left a Vm stating the reason of the call & to call us back

## 2016-05-22 NOTE — Telephone Encounter (Signed)
Patient returned nurse's call regarding lab results. Pt gave the verbal ok to leave a detailed message if she doesn't answer.   Thank you.

## 2016-05-24 NOTE — Telephone Encounter (Signed)
CMA call second time to go over lab results  Patient did not answer for the second time but CMA left a VM stating the results as she gave verbal ok to leave detailed message & if she had any questions about the results just to call me back

## 2016-06-13 ENCOUNTER — Ambulatory Visit: Payer: Medicaid Other | Admitting: Podiatry

## 2016-06-27 ENCOUNTER — Telehealth: Payer: Self-pay | Admitting: Family Medicine

## 2016-06-27 ENCOUNTER — Other Ambulatory Visit: Payer: Self-pay | Admitting: Family Medicine

## 2016-06-27 DIAGNOSIS — Z794 Long term (current) use of insulin: Principal | ICD-10-CM

## 2016-06-27 DIAGNOSIS — E118 Type 2 diabetes mellitus with unspecified complications: Secondary | ICD-10-CM

## 2016-06-27 MED ORDER — GLUCOSE BLOOD VI STRP: ORAL_STRIP | 11 refills | Status: DC

## 2016-06-27 NOTE — Telephone Encounter (Signed)
Medication list shows patient has accu-check aviva plus meter.Therefore accu check aviva plus test strips were ordered. Order for glucose strips will be sent to pharmacy.

## 2016-06-27 NOTE — Telephone Encounter (Signed)
Patient states she cannot use the current test strips that she was prescribed. Requesting a different kind of test strip and for the Rx to be sent to [facility] on [address]  °

## 2016-06-27 NOTE — Telephone Encounter (Signed)
Patient states she cannot use the current test strips that she was prescribed. Requesting a different kind of test strip and for the Rx to be sent to [facility] on [address]

## 2016-06-29 ENCOUNTER — Other Ambulatory Visit: Payer: Self-pay | Admitting: Internal Medicine

## 2016-06-29 DIAGNOSIS — E119 Type 2 diabetes mellitus without complications: Secondary | ICD-10-CM

## 2016-07-02 NOTE — Telephone Encounter (Signed)
CMA call patient to let her know that her test strips is already order & sent to the pharmacy   Patient did not answer but left a VM stating a detailed message

## 2016-07-03 ENCOUNTER — Other Ambulatory Visit: Payer: Self-pay | Admitting: Pharmacist

## 2016-07-03 MED ORDER — GLUCOSE BLOOD VI STRP: ORAL_STRIP | 12 refills | Status: DC

## 2016-08-15 ENCOUNTER — Ambulatory Visit: Payer: Medicaid Other | Attending: Family Medicine | Admitting: Family Medicine

## 2016-08-15 ENCOUNTER — Encounter: Payer: Self-pay | Admitting: Family Medicine

## 2016-08-15 VITALS — BP 153/83 | HR 80 | Temp 98.3°F | Resp 18 | Ht 68.0 in | Wt 214.2 lb

## 2016-08-15 DIAGNOSIS — Z794 Long term (current) use of insulin: Secondary | ICD-10-CM | POA: Diagnosis not present

## 2016-08-15 DIAGNOSIS — E11311 Type 2 diabetes mellitus with unspecified diabetic retinopathy with macular edema: Secondary | ICD-10-CM

## 2016-08-15 DIAGNOSIS — Z6830 Body mass index (BMI) 30.0-30.9, adult: Secondary | ICD-10-CM | POA: Diagnosis not present

## 2016-08-15 DIAGNOSIS — E782 Mixed hyperlipidemia: Secondary | ICD-10-CM | POA: Insufficient documentation

## 2016-08-15 DIAGNOSIS — E119 Type 2 diabetes mellitus without complications: Secondary | ICD-10-CM | POA: Diagnosis present

## 2016-08-15 DIAGNOSIS — E11319 Type 2 diabetes mellitus with unspecified diabetic retinopathy without macular edema: Secondary | ICD-10-CM | POA: Insufficient documentation

## 2016-08-15 DIAGNOSIS — I10 Essential (primary) hypertension: Secondary | ICD-10-CM | POA: Diagnosis present

## 2016-08-15 DIAGNOSIS — K029 Dental caries, unspecified: Secondary | ICD-10-CM

## 2016-08-15 DIAGNOSIS — E669 Obesity, unspecified: Secondary | ICD-10-CM | POA: Insufficient documentation

## 2016-08-15 LAB — GLUCOSE, POCT (MANUAL RESULT ENTRY): POC Glucose: 82 mg/dl (ref 70–99)

## 2016-08-15 LAB — POCT GLYCOSYLATED HEMOGLOBIN (HGB A1C): HEMOGLOBIN A1C: 6.4

## 2016-08-15 MED ORDER — GLIMEPIRIDE 4 MG PO TABS: 4.0000 mg | ORAL_TABLET | Freq: Every day | ORAL | 3 refills | Status: DC

## 2016-08-15 MED ORDER — METFORMIN HCL 1000 MG PO TABS: 1000.0000 mg | ORAL_TABLET | Freq: Two times a day (BID) | ORAL | 3 refills | Status: DC

## 2016-08-15 MED ORDER — LISINOPRIL 40 MG PO TABS: 40.0000 mg | ORAL_TABLET | Freq: Every day | ORAL | 0 refills | Status: DC

## 2016-08-15 MED ORDER — AMLODIPINE BESYLATE 5 MG PO TABS: 5.0000 mg | ORAL_TABLET | Freq: Every day | ORAL | 0 refills | Status: DC

## 2016-08-15 MED ORDER — PENICILLIN V POTASSIUM 250 MG PO TABS: 250.0000 mg | ORAL_TABLET | Freq: Four times a day (QID) | ORAL | 0 refills | Status: DC

## 2016-08-15 NOTE — Progress Notes (Signed)
Patient is here for f/up  Patient complains about tooth pain that came 2 days ago  Patient has eaten for today   Patient has taking her medication for today

## 2016-08-15 NOTE — Patient Instructions (Signed)
Type 2 Diabetes Mellitus, Self Care, Adult When you have type 2 diabetes (type 2 diabetes mellitus), you must keep your blood sugar (glucose) under control. You can do this with:  Nutrition.  Exercise.  Lifestyle changes.  Medicines or insulin, if needed.  Support from your doctors and others. How do I manage my blood sugar?  Check your blood sugar level every day, as often as told.  Call your doctor if your blood sugar is above your goal numbers for 2 tests in a row.  Have your A1c (hemoglobin A1c) level checked at least two times a year. Have it checked more often if your doctor tells you to. Your doctor will set treatment goals for you. Generally, you should have these blood sugar levels:  Before meals (preprandial): 80-130 mg/dL (4.4-7.2 mmol/L).  After meals (postprandial): lower than 180 mg/dL (10 mmol/L).  A1c level: less than 7%. What do I need to know about high blood sugar? High blood sugar is called hyperglycemia. Know the signs of high blood sugar. Signs may include:  Feeling:  Thirsty.  Hungry.  Very tired.  Needing to pee (urinate) more than usual.  Blurry vision. What do I need to know about low blood sugar? Low blood sugar is called hypoglycemia. This is when blood sugar is at or below 70 mg/dL (3.9 mmol/L). Symptoms may include:  Feeling:  Hungry.  Worried or nervous (anxious).  Sweaty and clammy.  Confused.  Dizzy.  Sleepy.  Sick to your stomach (nauseous).  Having:  A fast heartbeat (palpitations).  A headache.  A change in your vision.  Jerky movements that you cannot control (seizure).  Nightmares.  Tingling or no feeling (numbness) around the mouth, lips, or tongue.  Having trouble with:  Talking.  Paying attention (concentrating).  Moving (coordination).  Sleeping.  Shaking.  Passing out (fainting).  Getting upset easily (irritability). Treating low blood sugar  To treat low blood sugar, eat or drink  something sugary right away. If you can think clearly and swallow safely, follow the 15:15 rule:  Take 15 grams of a fast-acting carb (carbohydrate). Some fast-acting carbs are:  1 tube of glucose gel.  3 sugar tablets (glucose pills).  6-8 pieces of hard candy.  4 oz (120 mL) of fruit juice.  4 oz (120 mL) regular (not diet) soda.  Check your blood sugar 15 minutes after you take the carb.  If your blood sugar is still at or below 70 mg/dL (3.9 mmol/L), take 15 grams of a carb again.  If your blood sugar does not go above 70 mg/dL (3.9 mmol/L) after 3 tries, get help right away.  After your blood sugar goes back to normal, eat a meal or a snack within 1 hour. Treating very low blood sugar  If your blood sugar is at or below 54 mg/dL (3 mmol/L), you have very low blood sugar (severe hypoglycemia). This is an emergency. Do not wait to see if the symptoms will go away. Get medical help right away. Call your local emergency services (911 in the [place] Do not drive yourself to the hospital. If you have very low blood sugar and you cannot eat or drink, you may need a glucagon shot (injection). A family member or friend should learn how to check your blood sugar and how to give you a glucagon shot. Ask your doctor if you need to have a glucagon shot kit at home. What else is important to manage my diabetes? Medicine  Follow these instructions   about insulin and diabetes medicines:  Take them as told by your doctor.  Adjust them as told by your doctor.  Do not run out of them. Having diabetes can raise your risk for other long-term conditions. These include heart or kidney disease. Your doctor may prescribe medicines to help prevent problems from diabetes. Food   Make healthy food choices. These include:  Chicken, fish, egg whites, and beans.  Oats, whole wheat, bulgur, brown rice, quinoa, and millet.  Fresh fruits and vegetables.  Low-fat dairy products.  Nuts, avocado, olive  oil, and canola oil.  Make a food plan with a specialist (dietitian).  Follow instructions from your doctor about what you cannot eat or drink.  Drink enough fluid to keep your pee (urine) clear or pale yellow.  Eat healthy snacks between healthy meals.  Keep track of carbs that you eat. Read food labels. Learn food serving sizes.  Follow your sick day plan when you cannot eat or drink normally. Make this plan with your doctor so it is ready to use. Activity  Exercise at least 3 times a week.  Do not go more than 2 days without exercising.  Talk with your doctor before you start a new exercise. Your doctor may need to adjust your insulin, medicines, or food. Lifestyle   Do not use any tobacco products. These include cigarettes, chewing tobacco, and e-cigarettes.If you need help quitting, ask your doctor.  Ask your doctor how much alcohol is safe for you.  Learn to deal with stress. If you need help with this, ask your doctor. Body care  Stay up to date with your shots (immunizations).  Have your eyes and feet checked by a doctor as often as told.  Check your skin and feet every day. Check for cuts, bruises, redness, blisters, or sores.  Brush your teeth and gums two times a day.  Floss at least one time a day.  Go to the dentist least one time every 6 months.  Stay at a healthy weight. General instructions   Take over-the-counter and prescription medicines only as told by your doctor.  Share your diabetes care plan with:  Your work or school.  People you live with.  Check your pee (urine) for ketones:  When you are sick.  As told by your doctor.  Carry a card or wear jewelry that says that you have diabetes.  Ask your doctor:  Do I need to meet with a diabetes educator?  Where can I find a support group for people with diabetes?  Keep all follow-up visits as told by your doctor. This is important. Where to find more information: To learn more about  diabetes, visit:  American Diabetes Association: www.diabetes.org  American Association of Diabetes Educators: www.diabeteseducator.org/patient-resources This information is not intended to replace advice given to you by your health care provider. Make sure you discuss any questions you have with your health care provider. Document Released: 06/27/2015 Document Revised: 08/11/2015 Document Reviewed: 04/08/2015 Elsevier Interactive Patient Education  2017 [employer]vier Inc[employer]  

## 2016-08-15 NOTE — Progress Notes (Signed)
Subjective:  Patient ID: Lisa Olson, female    DOB: [birth year]  Age: 56 y.o. MRN: [id number]  CC: Diabetes and Hypertension   HPI Lisa Olson presents for  Diabetes Mellitus: Patient presents for follow up of diabetes. Symptoms: none.Patient denies foot ulcerations, hyperglycemia, nausea, paresthesia of the feet, visual disturbances and vomitting.  Evaluation to date has been included: fasting blood sugar, fasting lipid panel, hemoglobin A1C and microalbuminuria.  Home sugars: BGs consistently in an acceptable range. Treatment to date: Continued sulfonylurea which has been effective and Continued metformin which has been effective.   Hypertension: Patient here for follow-up of elevated blood pressure. She is exercising and is adherent to low salt diet. She does not check BP at home.  Cardiac symptoms none. Patient denies chest pain, claudication, dyspnea, orthopnea, palpitations and syncope.  Cardiovascular risk factors: diabetes mellitus, dyslipidemia, hypertension and obesity (BMI >= 30 kg/m2). Use of agents associated with hypertension: none. History of target organ damage: retinopathy.  Outpatient Medications Prior to Visit  Medication Sig Dispense Refill  . ACCU-CHEK SOFTCLIX LANCETS lancets Use as instructed 100 each 12  . atorvastatin (LIPITOR) 20 MG tablet Take 1 tablet (20 mg total) by mouth daily. 30 tablet 2  . Blood Glucose Monitoring Suppl (ACCU-CHEK AVIVA PLUS) w/Device KIT Use as directed 1 kit 0  . Calcium 600-400 MG-UNIT CHEW Chew 1 tablet by mouth daily. 60 tablet 2  . cyclobenzaprine (FLEXERIL) 5 MG tablet Take 2 tablets (10 mg total) by mouth 3 (three) times daily as needed for muscle spasms. 30 tablet 0  . glucose blood (ACCU-CHEK AVIVA PLUS) test strip Use as instructed 100 each 12  . Lancet Devices (ACCU-CHEK SOFTCLIX) lancets Use as instructed 1 each 0  . glimepiride (AMARYL) 4 MG tablet Take 1 tablet (4 mg total) by mouth daily with breakfast. 30 tablet 2  . lisinopril  (PRINIVIL,ZESTRIL) 30 MG tablet Take 1 tablet (30 mg total) by mouth daily. 30 tablet 2  . metFORMIN (GLUCOPHAGE) 1000 MG tablet Take 1 tablet (1,000 mg total) by mouth 2 (two) times daily with a meal. 60 tablet 2  . metFORMIN (GLUCOPHAGE) 1000 MG tablet TAKE ONE TABLET BY MOUTH TWICE DAILY WITH  A  MEAL 60 tablet 2   No facility-administered medications prior to visit.     ROS Review of Systems  Constitutional: Negative.   Eyes: Negative.   Respiratory: Negative.   Cardiovascular: Negative.   Gastrointestinal: Negative.   Skin: Negative.   Psychiatric/Behavioral: Negative.     Objective:  BP (!) 153/83   Pulse 80   Temp 98.3 F (36.8 C) (Oral)   Resp 18   Ht _0  (1.727 m)   Wt 214 lb 3.2 oz (97.2 kg)   SpO2 98%   BMI 32.57 kg/m   BP/Weight 08/15/2016 05/14/2016 09/12/9483  Systolic BP 462 703 500  Diastolic BP 83 79 88  Wt. (Lbs) 214.2 213.4 -  BMI 32.57 33.42 -   Physical Exam  Constitutional: She appears well-developed and well-nourished.  HENT:  Head: Normocephalic.  Nose: Nose normal.  Mouth/Throat: Uvula is midline, oropharynx is clear and moist and mucous membranes are normal. Abnormal dentition. Dental caries present. No posterior oropharyngeal edema.  Eyes: Conjunctivae are normal. Pupils are equal, round, and reactive to light.  Cardiovascular: Normal rate, regular rhythm, normal heart sounds and intact distal pulses.   Pulmonary/Chest: Effort normal and breath sounds normal.  Abdominal: Soft. Bowel sounds are normal.  Skin: Skin is warm and dry.  Psychiatric: She has a normal mood and affect.  Nursing note and vitals reviewed.  Assessment & Plan:   Problem List Items Addressed This Visit      Cardiovascular and Mediastinum   Essential hypertension   Schedule BP recheck in 2 weeks with nurse.   If BP is greater than 90/60 (MAP 65 or greater) but not less than 130/80 may increase dose    of amlodipine to 10 mg QD and recheck in another 2 weeks with  clinic RN.    Relevant Medications   lisinopril (PRINIVIL,ZESTRIL) 40 MG tablet   amLODipine (NORVASC) 5 MG tablet   Other Relevant Orders   Lipid Panel     Endocrine   Type 2 diabetes mellitus with retinopathy of both eyes, with long-term current use of insulin (HCC) - Primary   Blood glucose well controlled on current medications   Relevant Medications   metFORMIN (GLUCOPHAGE) 1000 MG tablet   glimepiride (AMARYL) 4 MG tablet   lisinopril (PRINIVIL,ZESTRIL) 40 MG tablet   Other Relevant Orders   Glucose (CBG) (Completed)   HgB A1c (Completed)   Lipid Panel    Other Visit Diagnoses    Dental decay       Relevant Medications   penicillin v potassium (VEETID) 250 MG tablet   Other Relevant Orders   Ambulatory referral to Dentistry   Mixed hyperlipidemia       Relevant Medications   lisinopril (PRINIVIL,ZESTRIL) 40 MG tablet   amLODipine (NORVASC) 5 MG tablet      Meds ordered this encounter  Medications  . metFORMIN (GLUCOPHAGE) 1000 MG tablet    Sig: Take 1 tablet (1,000 mg total) by mouth 2 (two) times daily with a meal.    Dispense:  180 tablet    Refill:  3    Please consider 90 day supplies to promote better adherence    Order Specific Question:   Supervising Provider    Answer:   Tresa Garter [[id number]]  . glimepiride (AMARYL) 4 MG tablet    Sig: Take 1 tablet (4 mg total) by mouth daily with breakfast.    Dispense:  90 tablet    Refill:  3    Order Specific Question:   Supervising Provider    Answer:   Tresa Garter [id number]  . lisinopril (PRINIVIL,ZESTRIL) 40 MG tablet    Sig: Take 1 tablet (40 mg total) by mouth daily.    Dispense:  90 tablet    Refill:  0    Order Specific Question:   Supervising Provider    Answer:   Tresa Garter [id number]  . amLODipine (NORVASC) 5 MG tablet    Sig: Take 1 tablet (5 mg total) by mouth daily.    Dispense:  90 tablet    Refill:  0    Order Specific Question:   Supervising Provider    Answer:    Tresa Garter [id number]  . penicillin v potassium (VEETID) 250 MG tablet    Sig: Take 1 tablet (250 mg total) by mouth 4 (four) times daily.    Dispense:  20 tablet    Refill:  0    Order Specific Question:   Supervising Provider    Answer:   Tresa Garter [[id number]]    Follow-up: Return in about 2 weeks (around 08/29/2016) for BP check with clinic RN.  Alfonse Spruce FNP

## 2016-08-23 ENCOUNTER — Ambulatory Visit: Payer: Medicaid Other | Attending: Family Medicine | Admitting: *Deleted

## 2016-08-23 VITALS — BP 132/70 | HR 82

## 2016-08-23 DIAGNOSIS — Z0130 Encounter for examination of blood pressure without abnormal findings: Secondary | ICD-10-CM | POA: Diagnosis present

## 2016-08-23 NOTE — Progress Notes (Signed)
[facility] and oriented and arrives in good spirits. Last OV  08/15/2016 with M.Hairston,FNP.   Pt denies chest pain, SOB, HA, or dizziness. Verified medication. Pt states medication was taken today.  Manual blood pressure reading:  132/70

## 2016-08-29 DIAGNOSIS — E119 Type 2 diabetes mellitus without complications: Secondary | ICD-10-CM | POA: Diagnosis not present

## 2016-08-29 DIAGNOSIS — L602 Onychogryphosis: Secondary | ICD-10-CM | POA: Diagnosis not present

## 2016-09-07 ENCOUNTER — Other Ambulatory Visit: Payer: Self-pay | Admitting: Pharmacist

## 2016-09-07 MED ORDER — GLUCOSE BLOOD VI STRP: ORAL_STRIP | 12 refills | Status: DC

## 2017-01-15 ENCOUNTER — Other Ambulatory Visit: Payer: Self-pay | Admitting: Family Medicine

## 2017-01-15 ENCOUNTER — Telehealth: Payer: Self-pay | Admitting: Family Medicine

## 2017-01-15 NOTE — Telephone Encounter (Signed)
Pt called to speak with the nurse or the pcp to check on paper that was fax to the pcp, please call pt

## 2017-01-15 NOTE — Telephone Encounter (Signed)
Message received from the patient requesting PCS. Call placed to the patient and explained to her that she has not been seen by M. Hairston, FNP in in the past 90 days so she will need to come for an office visit before the referral can be completed. An appointment was scheduled for 02/06/17 @ 1115 and this CM explained to her that she can call the clinic at any time to check on appointment cancellations for an earlier appointment.She verbalized understanding.  She explained that the agency - [facility] was recommended to her by the nurse in her building.

## 2017-01-23 ENCOUNTER — Other Ambulatory Visit: Payer: Self-pay | Admitting: Family Medicine

## 2017-01-23 DIAGNOSIS — I10 Essential (primary) hypertension: Secondary | ICD-10-CM

## 2017-02-06 ENCOUNTER — Encounter: Payer: Self-pay | Admitting: Family Medicine

## 2017-02-06 ENCOUNTER — Ambulatory Visit: Payer: Medicaid Other | Attending: Family Medicine | Admitting: Family Medicine

## 2017-02-06 ENCOUNTER — Other Ambulatory Visit: Payer: Self-pay

## 2017-02-06 VITALS — BP 128/79 | HR 88 | Temp 98.3°F | Resp 18 | Ht 68.0 in | Wt 216.8 lb

## 2017-02-06 DIAGNOSIS — Z1211 Encounter for screening for malignant neoplasm of colon: Secondary | ICD-10-CM

## 2017-02-06 DIAGNOSIS — I10 Essential (primary) hypertension: Secondary | ICD-10-CM | POA: Diagnosis not present

## 2017-02-06 DIAGNOSIS — Z87898 Personal history of other specified conditions: Secondary | ICD-10-CM

## 2017-02-06 DIAGNOSIS — N644 Mastodynia: Secondary | ICD-10-CM | POA: Diagnosis not present

## 2017-02-06 DIAGNOSIS — R9431 Abnormal electrocardiogram [ECG] [EKG]: Secondary | ICD-10-CM

## 2017-02-06 DIAGNOSIS — E11311 Type 2 diabetes mellitus with unspecified diabetic retinopathy with macular edema: Secondary | ICD-10-CM | POA: Insufficient documentation

## 2017-02-06 DIAGNOSIS — F4320 Adjustment disorder, unspecified: Secondary | ICD-10-CM | POA: Diagnosis not present

## 2017-02-06 DIAGNOSIS — F4322 Adjustment disorder with anxiety: Secondary | ICD-10-CM

## 2017-02-06 DIAGNOSIS — Z794 Long term (current) use of insulin: Secondary | ICD-10-CM | POA: Diagnosis not present

## 2017-02-06 DIAGNOSIS — Z79899 Other long term (current) drug therapy: Secondary | ICD-10-CM | POA: Diagnosis not present

## 2017-02-06 DIAGNOSIS — Z7984 Long term (current) use of oral hypoglycemic drugs: Secondary | ICD-10-CM | POA: Diagnosis not present

## 2017-02-06 LAB — GLUCOSE, POCT (MANUAL RESULT ENTRY): POC Glucose: 185 mg/dl — AB (ref 70–99)

## 2017-02-06 LAB — POCT GLYCOSYLATED HEMOGLOBIN (HGB A1C): HEMOGLOBIN A1C: 7.5

## 2017-02-06 MED ORDER — AMLODIPINE BESYLATE 5 MG PO TABS: 5.0000 mg | ORAL_TABLET | Freq: Every day | ORAL | 1 refills | Status: DC

## 2017-02-06 MED ORDER — HYDROXYZINE HCL 10 MG PO TABS: 10.0000 mg | ORAL_TABLET | Freq: Three times a day (TID) | ORAL | 0 refills | Status: DC | PRN

## 2017-02-06 MED ORDER — GLIMEPIRIDE 4 MG PO TABS: 4.0000 mg | ORAL_TABLET | Freq: Every day | ORAL | 3 refills | Status: DC

## 2017-02-06 MED ORDER — LISINOPRIL 40 MG PO TABS: 40.0000 mg | ORAL_TABLET | Freq: Every day | ORAL | 1 refills | Status: DC

## 2017-02-06 MED ORDER — METFORMIN HCL 1000 MG PO TABS: 1000.0000 mg | ORAL_TABLET | Freq: Two times a day (BID) | ORAL | 3 refills | Status: DC

## 2017-02-06 NOTE — Progress Notes (Signed)
Subjective:  Patient ID: Lisa Olson, female    DOB: May 16, 1960  Age: 56 y.o. MRN: [id number]  CC: Diabetes and Hypertension   HPI [name] presents for hypertension and diabetes follow-up.  She also complains of breast pain, palpitations, and anxious mood. Diabetes Mellitus: Symptoms: none. History of blindness. Patient denies foot ulcerations, hyperglycemia, nausea, paresthesia of the feet, visual disturbances and vomitting.  Evaluation to date has been included: fasting blood sugar, fasting lipid panel, hemoglobin A1C and microalbuminuria.  Home sugars: BGs consistently in an acceptable range. Treatment to date: Continued sulfonylurea which has been effective and Continued metformin which has been effective. Hypertension:She is exercising and is adherent to low salt diet. She does not check BP at home.  Cardiac symptoms none. Patient denies chest pain, claudication, dyspnea, orthopnea, palpitations and syncope.  Cardiovascular risk factors: diabetes mellitus, dyslipidemia, hypertension and obesity (BMI >= 30 kg/m2). Use of agents associated with hypertension: none. History of target organ damage: retinopathy.  Breast pain: Onset a few months ago.  Left-sided.  She denies any lumps, tenting or dimpling of the breast, or nipple discharge.  She denies performing monthly SBE.  She is not a current smoker.  Palpitations: Onset greater than 1 month ago.  She reports palpitations with activity and at rest.  She denies any chest tightness, dyspnea, or syncope or near syncope.  Anxious mood: She has the following symptoms: insomnia, palpitations, racing thoughts. Onset of symptoms was approximately several months ago, gradually worsening since that time. She denies current suicidal and homicidal ideation. Possible organic causes contributing are: none. Risk factors: none Previous treatment includes none.  She declined speaking with the LCSW at this time.  She reports being open to medication however,  does not want  to have to take medication every day for symptoms.   Outpatient Medications Prior to Visit  Medication Sig Dispense Refill  . ACCU-CHEK SOFTCLIX LANCETS lancets Use as instructed 100 each 12  . amLODipine (NORVASC) 5 MG tablet Take 1 tablet (5 mg total) by mouth daily. 90 tablet 0  . atorvastatin (LIPITOR) 20 MG tablet Take 1 tablet (20 mg total) by mouth daily. 30 tablet 2  . Blood Glucose Monitoring Suppl (ACCU-CHEK AVIVA PLUS) w/Device KIT Use as directed 1 kit 0  . Calcium 600-400 MG-UNIT CHEW Chew 1 tablet by mouth daily. 60 tablet 2  . cyclobenzaprine (FLEXERIL) 5 MG tablet Take 2 tablets (10 mg total) by mouth 3 (three) times daily as needed for muscle spasms. 30 tablet 0  . glimepiride (AMARYL) 4 MG tablet Take 1 tablet (4 mg total) by mouth daily with breakfast. 90 tablet 3  . glucose blood (ACCU-CHEK AVIVA PLUS) test strip Use as instructed for 3 times daily testing of blood sugar. E11.9 100 each 12  . Lancet Devices (ACCU-CHEK SOFTCLIX) lancets Use as instructed 1 each 0  . lisinopril (PRINIVIL,ZESTRIL) 40 MG tablet TAKE 1 TABLET BY MOUTH ONCE DAILY 30 tablet 0  . metFORMIN (GLUCOPHAGE) 1000 MG tablet Take 1 tablet (1,000 mg total) by mouth 2 (two) times daily with a meal. 180 tablet 3  . penicillin v potassium (VEETID) 250 MG tablet Take 1 tablet (250 mg total) by mouth 4 (four) times daily. 20 tablet 0   No facility-administered medications prior to visit.     ROS Review of Systems  Constitutional: Negative.   Eyes:       History of blindness.  Respiratory: Negative.        Chest wall: Breast pain.  Cardiovascular: Negative.   Gastrointestinal: Negative.   Skin: Negative.   Psychiatric/Behavioral: Negative for suicidal ideas. The patient is nervous/anxious.     Objective:  BP 128/79 (BP Location: Left Arm, Patient Position: Sitting, Cuff Size: Normal)   Pulse 88   Temp 98.3 F (36.8 C) (Oral)   Resp 18   Ht _0  (1.727 m)   Wt 216 lb 12.8 oz (98.3 kg)   SpO2  100%   BMI 32.96 kg/m   BP/Weight 02/06/2017 08/23/2016 9/35/7017  Systolic BP 793 903 009  Diastolic BP 79 70 83  Wt. (Lbs) 216.8 - 214.2  BMI 32.96 - 32.57   Physical Exam  Constitutional: She appears well-developed and well-nourished.  Cardiovascular: Normal rate, regular rhythm, normal heart sounds and intact distal pulses.  Pulmonary/Chest: Effort normal and breath sounds normal. Right breast exhibits no mass, no nipple discharge, no skin change and no tenderness. Left breast exhibits no mass, no nipple discharge, no skin change and no tenderness.  Abdominal: Soft. Bowel sounds are normal. There is no tenderness.  Skin: Skin is warm and dry.  Psychiatric: Her speech is normal. Her mood appears anxious. She is not hyperactive. She expresses no homicidal ideation. She expresses no homicidal plans. She is attentive.  Nursing note and vitals reviewed.  Assessment & Plan:   1. 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  Dispense: 90 tablet; Refill: 3 - metFORMIN (GLUCOPHAGE) 1000 MG tablet; Take 1 tablet (1,000 mg total) by mouth 2 (two) times daily with a meal.  Dispense: 180 tablet; Refill: 3 - Ambulatory referral to Ophthalmology  2. Essential hypertension  - amLODipine (NORVASC) 5 MG tablet; Take 1 tablet (5 mg total) by mouth daily.  Dispense: 90 tablet; Refill: 1 - lisinopril (PRINIVIL,ZESTRIL) 40 MG tablet; Take 1 tablet (40 mg total) by mouth daily.  Dispense: 90 tablet; Refill: 1  3. History of palpitations  - EKG 12-Lead - TSH - CBC - Basic metabolic panel  4. Breast tenderness  - MM SCREENING BREAST TOMO BILATERAL; Future  5. Anxious mood as adjustment reaction  - hydrOXYzine (ATARAX/VISTARIL) 10 MG tablet; Take 1 tablet  (10 mg total) by mouth 3 (three) times daily as needed for itching or anxiety.  Dispense: 30 tablet; Refill: 0  6. Screen for colon cancer  - Fecal occult blood, imunochemical  7. ECG abnormality  - CBC - Basic metabolic panel - DG Chest 1 View; Future  Follow up: Return in about 3 months (around 05/09/2017) for DM/HTN.   Alfonse Spruce FNP

## 2017-02-06 NOTE — Patient Instructions (Signed)
Diabetes Mellitus and Food It is important for you to manage your blood sugar (glucose) level. Your blood glucose level can be greatly affected by what you eat. Eating healthier foods in the appropriate amounts throughout the day at about the same time each day will help you control your blood glucose level. It can also help slow or prevent worsening of your diabetes mellitus. Healthy eating may even help you improve the level of your blood pressure and reach or maintain a healthy weight. General recommendations for healthful eating and cooking habits include:  Eating meals and snacks regularly. Avoid going long periods of time without eating to lose weight.  Eating a diet that consists mainly of plant-based foods, such as fruits, vegetables, nuts, legumes, and whole grains.  Using low-heat cooking methods, such as baking, instead of high-heat cooking methods, such as deep frying.  Work with your dietitian to make sure you understand how to use the Nutrition Facts information on food labels. How can food affect me? Carbohydrates Carbohydrates affect your blood glucose level more than any other type of food. Your dietitian will help you determine how many carbohydrates to eat at each meal and teach you how to count carbohydrates. Counting carbohydrates is important to keep your blood glucose at a healthy level, especially if you are using insulin or taking certain medicines for diabetes mellitus. Alcohol Alcohol can cause sudden decreases in blood glucose (hypoglycemia), especially if you use insulin or take certain medicines for diabetes mellitus. Hypoglycemia can be a life-threatening condition. Symptoms of hypoglycemia (sleepiness, dizziness, and disorientation) are similar to symptoms of having too much alcohol. If your health care provider has given you approval to drink alcohol, do so in moderation and use the following guidelines:  Women should not have more than one drink per day, and men  should not have more than two drinks per day. One drink is equal to: ? 12 oz of beer. ? 5 oz of wine. ? 1 oz of hard liquor.  Do not drink on an empty stomach.  Keep yourself hydrated. Have water, diet soda, or unsweetened iced tea.  Regular soda, juice, and other mixers might contain a lot of carbohydrates and should be counted.  What foods are not recommended? As you make food choices, it is important to remember that all foods are not the same. Some foods have fewer nutrients per serving than other foods, even though they might have the same number of calories or carbohydrates. It is difficult to get your body what it needs when you eat foods with fewer nutrients. Examples of foods that you should avoid that are high in calories and carbohydrates but low in nutrients include:  Trans fats (most processed foods list trans fats on the Nutrition Facts label).  Regular soda.  Juice.  Candy.  Sweets, such as cake, pie, doughnuts, and cookies.  Fried foods.  What foods can I eat? Eat nutrient-rich foods, which will nourish your body and keep you healthy. The food you should eat also will depend on several factors, including:  The calories you need.  The medicines you take.  Your weight.  Your blood glucose level.  Your blood pressure level.  Your cholesterol level.  You should eat a variety of foods, including:  Protein. ? Lean cuts of meat. ? Proteins low in saturated fats, such as fish, egg whites, and beans. Avoid processed meats.  Fruits and vegetables. ? Fruits and vegetables that may help control blood glucose levels, such as apples,   mangoes, and yams.  Dairy products. ? Choose fat-free or low-fat dairy products, such as milk, yogurt, and cheese.  Grains, bread, pasta, and rice. ? Choose whole grain products, such as multigrain bread, whole oats, and brown rice. These foods may help control blood pressure.  Fats. ? Foods containing healthful fats, such as  nuts, avocado, olive oil, canola oil, and fish.  Does everyone with diabetes mellitus have the same meal plan? Because every person with diabetes mellitus is different, there is not one meal plan that works for everyone. It is very important that you meet with a dietitian who will help you create a meal plan that is just right for you. This information is not intended to replace advice given to you by your health care provider. Make sure you discuss any questions you have with your health care provider. Document Released: 11/30/2004 Document Revised: 08/11/2015 Document Reviewed: 01/30/2013 Elsevier Interactive Patient Education  2017 [employer] Inc.  

## 2017-02-12 ENCOUNTER — Telehealth: Payer: Self-pay

## 2017-02-12 ENCOUNTER — Telehealth: Payer: Self-pay | Admitting: Family Medicine

## 2017-02-12 NOTE — Telephone Encounter (Signed)
Call placed to patient #[phone] regarding patient's application for PCS services. No answer. Left patient a message to please return my call at #[phone] in order for me to give her additional information.

## 2017-02-12 NOTE — Telephone Encounter (Signed)
[facility]

## 2017-02-13 ENCOUNTER — Telehealth: Payer: Self-pay | Admitting: Family Medicine

## 2017-02-13 NOTE — Telephone Encounter (Signed)
Call placed to [facility] #[phone] to check on the status of patient's PCS form. Spoke with Jeoffrey MassedGermain and he informed me that at the moment it doesn't show on their information system patient's request. He asked that we call back tomorrow or Friday.

## 2017-02-13 NOTE — Telephone Encounter (Signed)
Patient returned my call from yesterday. Informed her that PCS forms were faxed and that [facility] will be reaching out to her in the next few days to coordinate her assessment date and time. Also informed patient that she can also notify them of which company she would like to use [facility]. Patient understood and informed me that she will be going out of town. Encouraged patient to schedule assessment when she returns. No further questions or concerns.

## 2017-02-15 ENCOUNTER — Telehealth: Payer: Self-pay | Admitting: Family Medicine

## 2017-02-15 NOTE — Telephone Encounter (Signed)
Call placed to [facility] #[phone] to check on the status of patient's PCS form. Spoke with Selena BattenKim and she informed me that it was received. Patient was contacted but assessment has not been scheduled since patient is out of town. They will coordinate a day once patient returns.

## 2017-05-02 ENCOUNTER — Ambulatory Visit: Payer: Self-pay | Admitting: Family Medicine

## 2017-06-20 ENCOUNTER — Other Ambulatory Visit: Payer: Self-pay | Admitting: Pharmacist

## 2017-06-20 DIAGNOSIS — I10 Essential (primary) hypertension: Secondary | ICD-10-CM

## 2017-06-20 MED ORDER — LISINOPRIL 40 MG PO TABS: 40.0000 mg | ORAL_TABLET | Freq: Every day | ORAL | 0 refills | Status: DC

## 2017-07-01 ENCOUNTER — Telehealth: Payer: Self-pay | Admitting: Internal Medicine

## 2017-07-01 DIAGNOSIS — E118 Type 2 diabetes mellitus with unspecified complications: Secondary | ICD-10-CM

## 2017-07-01 NOTE — Telephone Encounter (Signed)
Pt called to request a refill for  Blood Glucose Monitoring Suppl (ACCU-CHEK AVIVA PLUS) w/Device KIT Since the one she has is not working Please sent ti to [facility] on [address], please follow up

## 2017-07-02 MED ORDER — ACCU-CHEK AVIVA PLUS W/DEVICE KIT
PACK | 0 refills | Status: DC
Start: 2017-07-02 — End: 2020-05-20

## 2017-07-02 NOTE — Telephone Encounter (Signed)
New script sent

## 2017-07-18 ENCOUNTER — Ambulatory Visit: Payer: Medicaid Other

## 2017-07-18 DIAGNOSIS — L03031 Cellulitis of right toe: Secondary | ICD-10-CM | POA: Diagnosis not present

## 2017-07-18 DIAGNOSIS — B351 Tinea unguium: Secondary | ICD-10-CM | POA: Diagnosis not present

## 2017-07-18 DIAGNOSIS — L03032 Cellulitis of left toe: Secondary | ICD-10-CM | POA: Diagnosis not present

## 2017-07-25 ENCOUNTER — Encounter: Payer: Self-pay | Admitting: Internal Medicine

## 2017-07-25 ENCOUNTER — Ambulatory Visit: Payer: Medicaid Other | Attending: Internal Medicine | Admitting: Internal Medicine

## 2017-07-25 VITALS — BP 160/90 | HR 81 | Temp 98.1°F | Resp 16 | Ht 68.0 in | Wt 218.4 lb

## 2017-07-25 DIAGNOSIS — E11319 Type 2 diabetes mellitus with unspecified diabetic retinopathy without macular edema: Secondary | ICD-10-CM | POA: Diagnosis not present

## 2017-07-25 DIAGNOSIS — E1142 Type 2 diabetes mellitus with diabetic polyneuropathy: Secondary | ICD-10-CM | POA: Insufficient documentation

## 2017-07-25 DIAGNOSIS — D649 Anemia, unspecified: Secondary | ICD-10-CM

## 2017-07-25 DIAGNOSIS — H548 Legal blindness, as defined in USA: Secondary | ICD-10-CM | POA: Diagnosis not present

## 2017-07-25 DIAGNOSIS — E118 Type 2 diabetes mellitus with unspecified complications: Secondary | ICD-10-CM | POA: Diagnosis not present

## 2017-07-25 DIAGNOSIS — Z7984 Long term (current) use of oral hypoglycemic drugs: Secondary | ICD-10-CM | POA: Insufficient documentation

## 2017-07-25 DIAGNOSIS — I10 Essential (primary) hypertension: Secondary | ICD-10-CM | POA: Diagnosis not present

## 2017-07-25 DIAGNOSIS — Z79899 Other long term (current) drug therapy: Secondary | ICD-10-CM | POA: Insufficient documentation

## 2017-07-25 LAB — POCT GLYCOSYLATED HEMOGLOBIN (HGB A1C): Hemoglobin A1C: 7.3

## 2017-07-25 LAB — GLUCOSE, POCT (MANUAL RESULT ENTRY): POC Glucose: 150 mg/dl — AB (ref 70–99)

## 2017-07-25 MED ORDER — LISINOPRIL 30 MG PO TABS: 30.0000 mg | ORAL_TABLET | Freq: Every day | ORAL | 3 refills | Status: DC

## 2017-07-25 NOTE — Progress Notes (Signed)
Patient ID: Lisa Olson, female    DOB: 04-17-1960  MRN: [id number]  CC: re-establish and Diabetes   Subjective: Lisa Olson is a 58 y.o. female who presents for chronic ds management. Previous PCP was NP [place]. 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  Range 130-150s a.m, 80-170s in the evenings.  Can tell when BS low Eating habits:  Doing okay with eating habits.  She avoids sugary drinks but still drinks about a half 1/2 a cup of juice several times a week Compliant with metformin and Amaryl. Has numbness in both feet.  Recently saw her podiatrist to have toenails and calluses trimmed.  She is on Lamisil pills for toenail fungus  HTN: She is supposed to be on lisinopril 40 mg and Norvasc 5 mg.  However patient states that she is taking only half of the 40 mg of lisinopril and never filled prescription for amlodipine.  She felt her previous PCP was increasing her medication too fast.  She limits salt in the foods. -Denies any chest pains or shortness of breath.  Patient Active Problem List   Diagnosis Date Noted  . Type 2 diabetes mellitus with retinopathy of both eyes, with long-term current use of insulin ([place]) 02/14/2016  . Essential hypertension 02/14/2016  . Enthesopathy 04/24/2013  . Boil, foot 03/25/2013  . Onychomycosis due to dermatophyte 03/25/2013  . Hallux valgus, acquired 03/25/2013  . Hammertoe 03/25/2013  . Pain in limb 03/25/2013     Current Outpatient Medications on File Prior to Visit  Medication Sig Dispense Refill  . terbinafine (LAMISIL) 250 MG tablet Take 250 mg by mouth daily.    Marland Kitchen ACCU-CHEK SOFTCLIX LANCETS lancets Use as instructed 100 each 12  . atorvastatin (LIPITOR) 20 MG tablet Take 1 tablet (20 mg total) by mouth daily. 30 tablet 2  . Blood Glucose Monitoring Suppl  (ACCU-CHEK AVIVA PLUS) w/Device KIT Use as directed 1 kit 0  . Calcium 600-400 MG-UNIT CHEW Chew 1 tablet by mouth daily. 60 tablet 2  . cyclobenzaprine (FLEXERIL) 5 MG tablet Take 2 tablets (10 mg total) by mouth 3 (three) times daily as needed for muscle spasms. 30 tablet 0  . glimepiride (AMARYL) 4 MG tablet Take 1 tablet (4 mg total) by mouth daily with breakfast. 90 tablet 3  . glucose blood (ACCU-CHEK AVIVA PLUS) test strip Use as instructed for 3 times daily testing of blood sugar. E11.9 100 each 12  . hydrOXYzine (ATARAX/VISTARIL) 10 MG tablet Take 1 tablet (10 mg total) by mouth 3 (three) times daily as needed for itching or anxiety. 30 tablet 0  . Lancet Devices (ACCU-CHEK SOFTCLIX) lancets Use as instructed 1 each 0  . metFORMIN (GLUCOPHAGE) 1000 MG tablet Take 1 tablet (1,000 mg total) by mouth 2 (two) times daily with a meal. 180 tablet 3   No current facility-administered medications on file prior to visit.     No Known Allergies  Social History   Socioeconomic History  . Marital status: Single    Spouse name: Not on file  . Number of children: Not on file  . Years of education: Not on file  . Highest education level: Not on file  Occupational History  . Not on file  Social Needs  . Financial resource strain: Not on  file  . Food insecurity:    Worry: Not on file    Inability: Not on file  . Transportation needs:    Medical: Not on file    Non-medical: Not on file  Tobacco Use  . Smoking status: Never Smoker  . Smokeless tobacco: Never Used  Substance and Sexual Activity  . Alcohol use: Not on file  . Drug use: No  . Sexual activity: Not on file  Lifestyle  . Physical activity:    Days per week: Not on file    Minutes per session: Not on file  . Stress: Not on file  Relationships  . Social connections:    Talks on phone: Not on file    Gets together: Not on file    Attends religious service: Not on file    Active member of club or organization: Not on  file    Attends meetings of clubs or organizations: Not on file    Relationship status: Not on file  . Intimate partner violence:    Fear of current or ex partner: Not on file    Emotionally abused: Not on file    Physically abused: Not on file    Forced sexual activity: Not on file  Other Topics Concern  . Not on file  Social History Narrative  . Not on file    No family history on file.  ROS: Review of Systems Negative except as stated above   PHYSICAL EXAM: BP (!) 160/90   Pulse 81   Temp 98.1 F (36.7 C) (Oral)   Resp 16   Ht _0  (1.727 m)   Wt 218 lb 6.4 oz (99.1 kg)   SpO2 98%   BMI 33.21 kg/m   Repeat BP 155/86 Physical Exam  General appearance - alert, well appearing, and in no distress Mental status - normal mood, behavior, speech, dress, motor activity, and thought processes Mouth - mucous membranes moist, pharynx normal without lesions Neck - supple, no significant adenopathy Chest - clear to auscultation, no wheezes, rales or rhonchi, symmetric air entry Heart - normal rate, regular rhythm, normal S1, S2, no murmurs, rubs, clicks or gallops Extremities -no lower extremity edema. Diabetic Foot Exam - Simple   Simple Foot Form Visual Inspection See comments:  Yes Sensation Testing See comments:  Yes Pulse Check See comments:  Yes Comments She has a callus about 2 to 3 cm in size on the sole LT foot proximal to fifth toes.  Toenails are discolored and appears to have been clipped.  DP/PT pulses 2+ bilaterally     Results for orders placed or performed in visit on 07/25/17  POCT glucose (manual entry)  Result Value Ref Range   POC Glucose 150 (A) 70 - 99 mg/dl  POCT glycosylated hemoglobin (Hb A1C)  Result Value Ref Range   Hemoglobin A1C 7.3     ASSESSMENT AND PLAN: 1. Essential hypertension Not at goal.  I recommend that she stay on lisinopril 20 mg as this is what she has been taking and add amlodipine 5 mg.  However patient does not want  to add another blood pressure medication, she prefers for me to increase the lisinopril.  We will increase to 30 mg. - lisinopril (PRINIVIL,ZESTRIL) 30 MG tablet; Take 1 tablet (30 mg total) by mouth daily.  Dispense: 90 tablet; Refill: 3  2. Controlled type 2 diabetes mellitus with complication, without long-term current use of insulin ([facility]) -A1c close to goal and blood sugars are good.  Continue current dose of metformin and Amaryl - POCT glucose (manual entry) - POCT glycosylated hemoglobin (Hb A1C) - Comprehensive metabolic panel - CBC - Lipid panel  3. Diabetic polyneuropathy associated with type 2 diabetes mellitus ([place]) Encourage checking and feeling the foot every day   Patient was given the opportunity to ask questions.  Patient verbalized understanding of the plan and was able to repeat key elements of the plan.   Orders Placed This Encounter  Procedures  . Comprehensive metabolic panel  . CBC  . Lipid panel  . POCT glucose (manual entry)  . POCT glycosylated hemoglobin (Hb A1C)     Requested Prescriptions   Signed Prescriptions Disp Refills  . lisinopril (PRINIVIL,ZESTRIL) 30 MG tablet 90 tablet 3    Sig: Take 1 tablet (30 mg total) by mouth daily.    Return in about 3 months (around 10/25/2017).  Karle Plumber, MD, FACP

## 2017-07-25 NOTE — Patient Instructions (Signed)
Increase Lisinopril to 30 mg daily. 

## 2017-07-26 LAB — COMPREHENSIVE METABOLIC PANEL
ALBUMIN: 4 g/dL (ref 3.5–5.5)
ALK PHOS: 76 IU/L (ref 39–117)
ALT: 14 IU/L (ref 0–32)
AST: 15 IU/L (ref 0–40)
Albumin/Globulin Ratio: 1.2 (ref 1.2–2.2)
BUN / CREAT RATIO: 19 (ref 9–23)
BUN: 14 mg/dL (ref 6–24)
CHLORIDE: 101 mmol/L (ref 96–106)
CO2: 26 mmol/L (ref 20–29)
CREATININE: 0.72 mg/dL (ref 0.57–1.00)
Calcium: 9.3 mg/dL (ref 8.7–10.2)
GFR calc Af Amer: 107 mL/min/{1.73_m2} (ref >59)
GFR calc non Af Amer: 93 mL/min/{1.73_m2} (ref >59)
GLOBULIN, TOTAL: 3.3 g/dL (ref 1.5–4.5)
Glucose: 112 mg/dL — ABNORMAL HIGH (ref 65–99)
POTASSIUM: 4.1 mmol/L (ref 3.5–5.2)
SODIUM: 140 mmol/L (ref 134–144)
Total Protein: 7.3 g/dL (ref 6.0–8.5)

## 2017-07-26 LAB — CBC
HEMATOCRIT: 33.4 % — AB (ref 34.0–46.6)
Hemoglobin: 10.3 g/dL — ABNORMAL LOW (ref 11.1–15.9)
MCH: 25.4 pg — AB (ref 26.6–33.0)
MCHC: 30.8 g/dL — ABNORMAL LOW (ref 31.5–35.7)
MCV: 82 fL (ref 79–97)
Platelets: 339 10*3/uL (ref 150–379)
RBC: 4.06 x10E6/uL (ref 3.77–5.28)
RDW: 14 % (ref 12.3–15.4)
WBC: 5.8 10*3/uL (ref 3.4–10.8)

## 2017-07-26 LAB — LIPID PANEL
CHOL/HDL RATIO: 4.9 ratio — AB (ref 0.0–4.4)
Cholesterol, Total: 175 mg/dL (ref 100–199)
HDL: 36 mg/dL — ABNORMAL LOW (ref >39)
LDL CALC: 77 mg/dL (ref 0–99)
TRIGLYCERIDES: 309 mg/dL — AB (ref 0–149)
VLDL Cholesterol Cal: 62 mg/dL — ABNORMAL HIGH (ref 5–40)

## 2017-07-26 NOTE — Progress Notes (Signed)
Results for orders placed or performed in visit on 07/25/17  Comprehensive metabolic panel  Result Value Ref Range   Glucose 112 (H) 65 - 99 mg/dL   BUN 14 6 - 24 mg/dL   Creatinine, Ser 1.61 0.57 - 1.00 mg/dL   GFR calc non Af Amer 93 >59 mL/min/1.73   GFR calc Af Amer 107 >59 mL/min/1.73   BUN/Creatinine Ratio 19 9 - 23   Sodium 140 134 - 144 mmol/L   Potassium 4.1 3.5 - 5.2 mmol/L   Chloride 101 96 - 106 mmol/L   CO2 26 20 - 29 mmol/L   Calcium 9.3 8.7 - 10.2 mg/dL   Total Protein 7.3 6.0 - 8.5 g/dL   Albumin 4.0 3.5 - 5.5 g/dL   Globulin, Total 3.3 1.5 - 4.5 g/dL   Albumin/Globulin Ratio 1.2 1.2 - 2.2   Bilirubin Total <0.2 0.0 - 1.2 mg/dL   Alkaline Phosphatase 76 39 - 117 IU/L   AST 15 0 - 40 IU/L   ALT 14 0 - 32 IU/L  CBC  Result Value Ref Range   WBC 5.8 3.4 - 10.8 x10E3/uL   RBC 4.06 3.77 - 5.28 x10E6/uL   Hemoglobin 10.3 (L) 11.1 - 15.9 g/dL   Hematocrit 09.6 (L) 04.5 - 46.6 %   MCV 82 79 - 97 fL   MCH 25.4 (L) 26.6 - 33.0 pg   MCHC 30.8 (L) 31.5 - 35.7 g/dL   RDW 40.9 81.1 - 91.4 %   Platelets 339 150 - 379 x10E3/uL  Lipid panel  Result Value Ref Range   Cholesterol, Total 175 100 - 199 mg/dL   Triglycerides 782 (H) 0 - 149 mg/dL   HDL 36 (L) >95 mg/dL   VLDL Cholesterol Cal 62 (H) 5 - 40 mg/dL   LDL Calculated 77 0 - 99 mg/dL   Chol/HDL Ratio 4.9 (H) 0.0 - 4.4 ratio  POCT glucose (manual entry)  Result Value Ref Range   POC Glucose 150 (A) 70 - 99 mg/dl  POCT glycosylated hemoglobin (Hb A1C)  Result Value Ref Range   Hemoglobin A1C 7.3    Will add iron studies.

## 2017-07-26 NOTE — Addendum Note (Signed)
Addended by: Jonah Blue B on: 07/26/2017 08:28 AM   Modules accepted: Orders

## 2017-07-27 LAB — IRON,TIBC AND FERRITIN PANEL
Ferritin: 227 ng/mL — ABNORMAL HIGH (ref 15–150)
IRON SATURATION: 12 % — AB (ref 15–55)
IRON: 34 ug/dL (ref 27–159)
TIBC: 279 ug/dL (ref 250–450)
UIBC: 245 ug/dL (ref 131–425)

## 2017-07-27 LAB — SPECIMEN STATUS REPORT

## 2017-07-29 ENCOUNTER — Other Ambulatory Visit: Payer: Self-pay | Admitting: *Deleted

## 2017-07-29 ENCOUNTER — Other Ambulatory Visit: Payer: Self-pay | Admitting: Internal Medicine

## 2017-07-29 MED ORDER — FERROUS SULFATE 325 (65 FE) MG PO TABS: 325.0000 mg | ORAL_TABLET | Freq: Every day | ORAL | 3 refills | Status: AC

## 2017-07-29 MED ORDER — FERROUS SULFATE 325 (65 FE) MG PO TABS: 325.0000 mg | ORAL_TABLET | Freq: Every day | ORAL | 3 refills | Status: DC

## 2017-08-05 ENCOUNTER — Other Ambulatory Visit: Payer: Self-pay | Admitting: Internal Medicine

## 2017-08-05 DIAGNOSIS — Z1231 Encounter for screening mammogram for malignant neoplasm of breast: Secondary | ICD-10-CM

## 2017-08-07 ENCOUNTER — Telehealth: Payer: Self-pay | Admitting: Internal Medicine

## 2017-08-07 ENCOUNTER — Encounter (INDEPENDENT_AMBULATORY_CARE_PROVIDER_SITE_OTHER): Payer: Self-pay | Admitting: Ophthalmology

## 2017-08-07 DIAGNOSIS — E113592 Type 2 diabetes mellitus with proliferative diabetic retinopathy without macular edema, left eye: Secondary | ICD-10-CM | POA: Diagnosis not present

## 2017-08-07 DIAGNOSIS — E113511 Type 2 diabetes mellitus with proliferative diabetic retinopathy with macular edema, right eye: Secondary | ICD-10-CM | POA: Diagnosis not present

## 2017-08-07 DIAGNOSIS — H25813 Combined forms of age-related cataract, bilateral: Secondary | ICD-10-CM | POA: Diagnosis not present

## 2017-08-07 NOTE — Telephone Encounter (Signed)
Patient dropped off 1 piece of paper to be filed out for the division of services for the blind. Patient was asked if this matter was spoken of at last visit 07-25-17 and patient stated yes.

## 2017-08-07 NOTE — Telephone Encounter (Signed)
Will forward to pcp

## 2017-08-14 ENCOUNTER — Other Ambulatory Visit: Payer: Self-pay | Admitting: Internal Medicine

## 2017-08-20 NOTE — Progress Notes (Addendum)
[facility] Note  08/21/2017     CHIEF COMPLAINT Patient presents for Retina Evaluation and Diabetic Eye Exam   HISTORY OF PRESENT ILLNESS: Lisa Olson is a 57 y.o. female who presents to the clinic today for:   HPI    Retina Evaluation    In both eyes.  This started 5 years ago.  Associated Symptoms Negative for Flashes, Pain, Trauma, Fever, Weight Loss, Scalp Tenderness, Redness, Floaters, Distortion, Photophobia, Jaw Claudication, Fatigue, Shoulder/Hip pain, Glare and Blind Spot.  Context:  distance vision, mid-range vision, reading, watching TV, computer work and dim lighting.  Treatments tried include surgery.  Response to treatment was no improvement.  I, the attending physician,  performed the HPI with the patient and updated documentation appropriately.          Diabetic Eye Exam    Vision is stable.  Associated Symptoms Negative for Fatigue, Jaw Claudication, Shoulder/Hip pain, Glare, Blind Spot, Distortion, Photophobia, Weight Loss, Scalp Tenderness, Redness, Floaters, Flashes, Pain, Trauma and Fever.  Diabetes characteristics include Type 2.  This started 10 years ago.  Blood sugar level is controlled.  Last Blood Glucose 149.  Last A1C 7.2.  Associated Diagnosis Neuropathy.          Comments    Referral of Dr. Katy Fitch for retina eval/DME2. Patient states she was not legally blind until she had Vitreous sx OS and Lasik Sx OD appx 5 years ago, she reports she was driving with OD before sx. Pt states she can see shadows with periphery vision only OS. Patient BS are controlled with Metformin and glipizide. BS 149 this am , A1C 7.2.        Last edited by [name], MD on 08/21/2017  1:36 PM. (History)    Pt states she moved to [place] in 2014; Pt states she has been working with [employer]; Pt reports her regular eye doctor no longer accepted her insurance; Pt states she then saw Dr. Katy Fitch for a routine exam; Pt states she has had low VA x 4-5  years; Pt states she had bleeds in OU VA; Pt states she had a bleed in OD, which was lasered and then OS was examined and was told she was "about to bleed, so they lasered me"; Pt states the doctors in [facility] then "did the vitrectomy in my left eye"; Pt states she has history of very uncontrolled DM; Pt states the first 5 years of her being dx with DM she never saw an ophthalmologist;   Referring physician: Debbra Riding, MD [address] [place], [place] [address]  HISTORICAL INFORMATION:   Selected notes from the MEDICAL RECORD NUMBER Referred by Dr. Wyatt Portela for DM exam LEE: 05.22.19 (S. Groat) [BCVA: OD: HM OS: HM]  Ocular Hx-Cataract OU, Diabetic Retinopathy OU, Retinal Vein Occlusion PMH-DM (last A1C 7.2, taking Metformin), HTN    CURRENT MEDICATIONS: No current outpatient medications on file. (Ophthalmic Drugs)   No current facility-administered medications for this visit.  (Ophthalmic Drugs)   Current Outpatient Medications (Other)  Medication Sig  . ACCU-CHEK SOFTCLIX LANCETS lancets Use as instructed  . atorvastatin (LIPITOR) 20 MG tablet Take 1 tablet (20 mg total) by mouth daily.  . Blood Glucose Monitoring Suppl (ACCU-CHEK AVIVA PLUS) w/Device KIT Use as directed  . Calcium 600-400 MG-UNIT CHEW Chew 1 tablet by mouth daily.  . ferrous sulfate 325 (65 FE) MG tablet Take 1 tablet (325 mg total) by  mouth daily with breakfast.  . glimepiride (AMARYL) 4 MG tablet Take 1 tablet (4 mg total) by mouth daily with breakfast.  . glucose blood (ACCU-CHEK AVIVA PLUS) test strip Use as instructed for 3 times daily testing of blood sugar. E11.9  . hydrOXYzine (ATARAX/VISTARIL) 10 MG tablet Take 1 tablet (10 mg total) by mouth 3 (three) times daily as needed for itching or anxiety.  Elmore Guise Devices (ACCU-CHEK SOFTCLIX) lancets Use as instructed  . lisinopril (PRINIVIL,ZESTRIL) 30 MG tablet Take 1 tablet (30 mg total) by mouth daily.  . metFORMIN (GLUCOPHAGE) 1000 MG tablet Take 1  tablet (1,000 mg total) by mouth 2 (two) times daily with a meal.  . Multiple Vitamins-Minerals (MULTIVITAMIN WITH MINERALS) tablet Take 1 tablet by mouth daily.   No current facility-administered medications for this visit.  (Other)      REVIEW OF SYSTEMS: ROS    Positive for: Neurological, Genitourinary, Musculoskeletal, Endocrine, Eyes   Negative for: Constitutional, Gastrointestinal, Skin, HENT, Cardiovascular, Respiratory, Psychiatric, Allergic/Imm, Heme/Lymph   Last edited by Zenovia Jordan, LPN on 11/19/5699 77:93 AM. (History)       ALLERGIES No Known Allergies  PAST MEDICAL HISTORY Past Medical History:  Diagnosis Date  . Diabetes mellitus without complication ([place])   . Hypertension   . Legally blind   . 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 EYE SURGERY    . LASIK    . RETINAL DETACHMENT SURGERY      FAMILY HISTORY Family History  Problem Relation Age of Onset  . Diabetes Mother   . Diabetes Father   . Hypertension Sister     SOCIAL HISTORY Social History   Tobacco Use  . Smoking status: Never Smoker  . Smokeless tobacco: Never Used  Substance Use Topics  . Alcohol use: Not on file  . Drug use: No         OPHTHALMIC EXAM:  Base Eye Exam    Visual Acuity (Snellen - Linear)      Right Left   Dist Ray HM HM       Visual Acuity #2 (Snellen - Linear)      Right Left   Dist [place] Fix, No follow Fix, No follow       Tonometry (Tonopen, 10:00 AM)      Right Left   Pressure 19 17       Pupils      Dark Light Shape React APD   Right 2 1 Round Minimal Trace   Left 2 1 Round Minimal Trace       Visual Fields (Counting fingers)      Left Right   Restrictions Total superior temporal, inferior temporal, superior nasal, inferior nasal deficiencies Total superior temporal, inferior temporal, superior nasal, inferior nasal deficiencies       Extraocular Movement      Right Left     Nystagmus Nystagmus    -- -- --  --  --  -- -- --   -- -- --  --  --  -- -- --         Neuro/Psych    Oriented x3:  Yes       Dilation    Both eyes:  1.0% Mydriacyl, Paremyd @ 10:00 AM        Slit Lamp and Fundus Exam    Slit Lamp Exam      Right Left   Lids/Lashes Dermatochalasis -  upper lid, mild Meibomian gland dysfunction Mild Meibomian gland dysfunction   Conjunctiva/Sclera Mild Melanosis, otherwise normal Nasal and Temporal Pinguecula   Cornea Mild Arcus, otherwise clear Mild Arcus, otherwise clear   Anterior Chamber Deep and quiet Deep and quiet   Iris Round and dilated, No NVI Round and dilated, No NVI   Lens 3+ Nuclear sclerosis, 2+ Cortical cataract, 2+ Posterior subcapsular cataract, Vacuoles 2+ Nuclear sclerosis, 1+ Cortical cataract, Vacuoles   Vitreous Mild Vitreous syneresis Mild Vitreous syneresis       Fundus Exam      Right Left   Disc fibrosis overlying Pallor, mild fibrosis   Macula Wolf jaw TRD with macular hole, macula completely detached Flat, atrophic, fibrosis and pigmented scarring temporal half of macula   Vessels Severe Vascular attenuation Severe Vascular attenuation   Periphery Attached, 360 PRP, room for PRP fill-in peripherally Attached, 360 PRP, fiborsis nasal to disc, room for PRP fill-in superiorly          IMAGING AND PROCEDURES  Imaging and Procedures for _0 @  OCT, Retina - OU - Both Eyes       Right Eye Quality was poor (uninterruptable ). Progression has no prior data. Findings include preretinal fibrosis, subretinal fluid, vitreous traction.   Left Eye Quality was good. Central Foveal Thickness: 208. Progression has no prior data. Findings include abnormal foveal contour, epiretinal membrane, lamellar hole, outer retinal atrophy, retinal drusen .   Notes *Images captured and stored on drive  Diagnosis / Impression:  OD: fibrosis with tractional retinal detachment OS: diffuse retinal atrophy, ERM with lamellar  hole  Clinical management:  See below  Abbreviations: NFP - Normal foveal profile. CME - cystoid macular edema. PED - pigment epithelial detachment. IRF - intraretinal fluid. SRF - subretinal fluid. EZ - ellipsoid zone. ERM - epiretinal membrane. ORA - outer retinal atrophy. ORT - outer retinal tubulation. SRHM - subretinal hyper-reflective material         Color Fundus Photography Optos - OU - Both Eyes       Right Eye Progression has no prior data. Disc findings include (fibrosis). Macula : macular hole, detached (Fibrosis and tractional retinal detachment). Vessels : attenuated, Neovascularization. Periphery : (360 PRP scars; attached).   Left Eye Progression has no prior data. Disc findings include pallor. Macula : flat, retinal pigment epithelium abnormalities (Temporal pigmented laser scars). Vessels : attenuated. Periphery : (360 PRP; attached).                 ASSESSMENT/PLAN:    ICD-10-CM   1. 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 Retinal detachment, tractional, right eye H33.41 Color Fundus Photography Optos - OU - Both Eyes  3. Macular hole of right eye H35.341   4. Stable proliferative diabetic retinopathy of left eye associated with type 2 diabetes mellitus ([place]) X10.6269   5. Retinal edema H35.81 OCT, Retina - OU - Both Eyes  6. Low vision, both eyes H54.3     1-3. 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D - pt with long standing history of low vision / legal blindness - onset of vision loss 5 yrs ago in [place] -- underwent PRP laser OD, PPV OS  - today, reports no acute change in vision OD, but on exam has a complete macular detachment with macular hole -- wolf-jaw fibrosis emanating from the disc - periphery attached with  good PRP in place - discussed findings, very guarded prognosis,  and treatment options - given longstanding VA of HM OD, unlikely that anatomic reattachment of retina will yield significantly improved vision - discussed possibility of surgery making vision worse and that goal of surgery would be to prevent phthisis - pt wishes to monitor for now and think about treatment options - f/u 3-4 weeks, sooner prn, to monitor for any acute changes in symptoms or exam  4. Stable PDR OS - s/p PPV and laser PRP OS ~5 yrs ago in [place] - significant macular atrophy OS - no active disease at this time - room for PRP fill in peripherally if needed - monitor  5. Mild non-central retinal edema OS - monitor  6. 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      Return in about 1 month (around 09/18/2017) for F/U TRD OD, DFE, OCT.  There are no Patient Instructions on file for this visit.   Explained the diagnoses, plan, and follow up with the patient and they expressed understanding.  Patient expressed understanding of the importance of proper follow up care.   This document serves as a record of services personally performed by Gardiner Sleeper, MD, PhD. It was created on their behalf by Ernest Mallick, OA, an ophthalmic assistant. The creation of this record is the provider's dictation and/or activities during the visit.    Electronically signed by: Ernest Mallick, OA  06.04.2019 3:28 PM    Gardiner Sleeper, M.D., Ph.D. Diseases & Surgery of the Retina and Vitreous [facility]  I have reviewed the above documentation for accuracy and completeness, and I agree with the above. Gardiner Sleeper, M.D., Ph.D. 08/22/17 3:43 PM   Abbreviations: M myopia (nearsighted); A astigmatism; H hyperopia (farsighted); P presbyopia; Mrx spectacle prescription;  CTL  contact lenses; OD right eye; OS left eye; OU both eyes  XT exotropia; ET esotropia; PEK punctate epithelial keratitis; PEE punctate epithelial erosions; DES dry eye syndrome; MGD meibomian gland dysfunction; ATs artificial tears; PFAT's preservative free artificial tears; [place] nuclear sclerotic cataract; PSC posterior subcapsular cataract; ERM epi-retinal membrane; PVD posterior vitreous detachment; RD retinal detachment; DM diabetes mellitus; DR diabetic retinopathy; NPDR non-proliferative diabetic retinopathy; PDR proliferative diabetic retinopathy; CSME clinically significant macular edema; DME diabetic macular edema; dbh dot blot hemorrhages; CWS cotton wool spot; POAG primary open angle glaucoma; C/D cup-to-disc ratio; HVF humphrey visual field; GVF goldmann visual field; OCT optical coherence tomography; IOP intraocular pressure; BRVO Branch retinal vein occlusion; CRVO central retinal vein occlusion; CRAO central retinal artery occlusion; BRAO branch retinal artery occlusion; RT retinal tear; SB scleral buckle; PPV pars plana vitrectomy; VH Vitreous hemorrhage; PRP panretinal laser photocoagulation; IVK intravitreal kenalog; VMT vitreomacular traction; MH Macular hole;  NVD neovascularization of the disc; NVE neovascularization elsewhere; AREDS age related eye disease study; ARMD age related macular degeneration; POAG primary open angle glaucoma; EBMD epithelial/anterior basement membrane dystrophy; ACIOL anterior chamber intraocular lens; IOL intraocular lens; PCIOL posterior chamber intraocular lens; Phaco/IOL phacoemulsification with intraocular lens placement; [place] photorefractive keratectomy; LASIK laser assisted in situ keratomileusis; HTN hypertension; DM diabetes mellitus; COPD chronic obstructive pulmonary disease

## 2017-08-21 ENCOUNTER — Ambulatory Visit (INDEPENDENT_AMBULATORY_CARE_PROVIDER_SITE_OTHER): Payer: Medicaid Other | Admitting: Ophthalmology

## 2017-08-21 ENCOUNTER — Encounter (INDEPENDENT_AMBULATORY_CARE_PROVIDER_SITE_OTHER): Payer: Self-pay | Admitting: Ophthalmology

## 2017-08-21 DIAGNOSIS — H35341 Macular cyst, hole, or pseudohole, right eye: Secondary | ICD-10-CM | POA: Diagnosis not present

## 2017-08-21 DIAGNOSIS — H543 Unqualified visual loss, both eyes: Secondary | ICD-10-CM | POA: Diagnosis not present

## 2017-08-21 DIAGNOSIS — H3341 Traction detachment of retina, right eye: Secondary | ICD-10-CM | POA: Diagnosis not present

## 2017-08-21 DIAGNOSIS — H3581 Retinal edema: Secondary | ICD-10-CM | POA: Diagnosis not present

## 2017-08-21 DIAGNOSIS — E113541 Type 2 diabetes mellitus with proliferative diabetic retinopathy with combined traction retinal detachment and rhegmatogenous retinal detachment, right eye: Secondary | ICD-10-CM | POA: Diagnosis not present

## 2017-08-21 DIAGNOSIS — E113552 Type 2 diabetes mellitus with stable proliferative diabetic retinopathy, left eye: Secondary | ICD-10-CM

## 2017-08-22 ENCOUNTER — Encounter (INDEPENDENT_AMBULATORY_CARE_PROVIDER_SITE_OTHER): Payer: Self-pay | Admitting: Ophthalmology

## 2017-08-26 ENCOUNTER — Ambulatory Visit
Admission: RE | Admit: 2017-08-26 | Discharge: 2017-08-26 | Disposition: A | Discharge status: Home / Self Care | Payer: Medicaid Other | Source: Ambulatory Visit | Attending: Internal Medicine | Admitting: Internal Medicine

## 2017-08-26 DIAGNOSIS — Z1231 Encounter for screening mammogram for malignant neoplasm of breast: Secondary | ICD-10-CM

## 2017-09-10 ENCOUNTER — Other Ambulatory Visit: Payer: Self-pay

## 2017-09-10 ENCOUNTER — Telehealth: Payer: Self-pay | Admitting: Internal Medicine

## 2017-09-10 DIAGNOSIS — E11311 Type 2 diabetes mellitus with unspecified diabetic retinopathy with macular edema: Secondary | ICD-10-CM

## 2017-09-10 MED ORDER — GLIMEPIRIDE 4 MG PO TABS: 4.0000 mg | ORAL_TABLET | Freq: Every day | ORAL | 3 refills | Status: DC

## 2017-09-10 MED ORDER — GLUCOSE BLOOD VI STRP: ORAL_STRIP | 12 refills | Status: DC

## 2017-09-10 NOTE — Telephone Encounter (Signed)
rxs have been sent to pharmacy 

## 2017-09-10 NOTE — Telephone Encounter (Signed)
Patient called requesting a refill on glimepiride (AMARYL) 4 MG tablet  And test strips. [facility] [address] Please f/u

## 2017-09-17 ENCOUNTER — Telehealth: Payer: Self-pay | Admitting: Internal Medicine

## 2017-09-17 NOTE — Progress Notes (Addendum)
[facility] Note  09/18/2017     CHIEF COMPLAINT Patient presents for Retina Follow Up   HISTORY OF PRESENT ILLNESS: Lisa Olson is a 57 y.o. female who presents to the clinic today for:   HPI    Retina Follow Up    Patient presents with  Retinal Break/Detachment.  In right eye.  This started years ago.  Severity is severe.  Duration of years.  Since onset it is stable.  I, the attending physician,  performed the HPI with the patient and updated documentation appropriately.          Comments    57 y/o female pt here for 1 mo f/u for diabetic retinopathy w/ combined traction and rhegmatogenous retinal detachment OD.  No change in vision OU.  Denies pain, flashes, floaters.  No gtts.       Last edited by [name], MD on 09/18/2017  4:20 PM. (History)    Pt states she moved to [place] in 2014; Pt states she has been working with [employer]; Pt reports her regular eye doctor no longer accepted her insurance; Pt states she then saw Dr. Katy Fitch for a routine exam; Pt states she has had low VA x 4-5 years; Pt states she had bleeds in OU VA; Pt states she had a bleed in OD, which was lasered and then OS was examined and was told she was "about to bleed, so they lasered me"; Pt states the doctors in [facility] then "did the vitrectomy in my left eye"; Pt states she has history of very uncontrolled DM; Pt states the first 5 years of her being dx with DM she never saw an ophthalmologist;   Referring physician: [name], MD [place], [place] [address]  HISTORICAL INFORMATION:   Selected notes from the MEDICAL RECORD NUMBER Referred by Dr. Wyatt Portela for DM exam LEE: 05.22.19 (S. Groat) [BCVA: OD: HM OS: HM]  Ocular Hx-Cataract OU, Diabetic Retinopathy OU, Retinal Vein Occlusion PMH-DM (last A1C 7.2, taking Metformin), HTN    CURRENT MEDICATIONS: No current outpatient medications on file. (Ophthalmic Drugs)   No current  facility-administered medications for this visit.  (Ophthalmic Drugs)   Current Outpatient Medications (Other)  Medication Sig  . ACCU-CHEK SOFTCLIX LANCETS lancets Use as instructed  . atorvastatin (LIPITOR) 20 MG tablet Take 1 tablet (20 mg total) by mouth daily.  . Blood Glucose Monitoring Suppl (ACCU-CHEK AVIVA PLUS) w/Device KIT Use as directed  . Calcium 600-400 MG-UNIT CHEW Chew 1 tablet by mouth daily.  . ferrous sulfate 325 (65 FE) MG tablet Take 1 tablet (325 mg total) by mouth daily with breakfast.  . glimepiride (AMARYL) 4 MG tablet Take 1 tablet (4 mg total) by mouth daily with breakfast.  . glucose blood (ACCU-CHEK AVIVA PLUS) test strip Use as instructed for 3 times daily testing of blood sugar. E11.9  . hydrOXYzine (ATARAX/VISTARIL) 10 MG tablet Take 1 tablet (10 mg total) by mouth 3 (three) times daily as needed for itching or anxiety.  Elmore Guise Devices (ACCU-CHEK SOFTCLIX) lancets Use as instructed  . lisinopril (PRINIVIL,ZESTRIL) 30 MG tablet Take 1 tablet (30 mg total) by mouth daily.  . metFORMIN (GLUCOPHAGE) 1000 MG tablet Take 1 tablet (1,000 mg total) by mouth 2 (two) times daily with a meal.  . Multiple Vitamins-Minerals (MULTIVITAMIN WITH MINERALS) tablet Take 1 tablet by mouth daily.  Marland Kitchen terbinafine (LAMISIL) 250 MG tablet Take 250 mg by mouth  daily.   No current facility-administered medications for this visit.  (Other)      REVIEW OF SYSTEMS: ROS    Positive for: Endocrine, Eyes   Negative for: Constitutional, Gastrointestinal, Neurological, Skin, Genitourinary, Musculoskeletal, HENT, Cardiovascular, Respiratory, Psychiatric, Allergic/Imm, Heme/Lymph   Last edited by Matthew Folks, COA on 09/18/2017  2:53 PM. (History)       ALLERGIES No Known Allergies  PAST MEDICAL HISTORY Past Medical History:  Diagnosis Date  . Diabetes mellitus without complication ([place])   . Hypertension   . Legally blind   . 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 EYE SURGERY    . LASIK    . RETINAL DETACHMENT SURGERY      FAMILY HISTORY Family History  Problem Relation Age of Onset  . Diabetes Mother   . Diabetes Father   . Hypertension Sister     SOCIAL HISTORY Social History   Tobacco Use  . Smoking status: Never Smoker  . Smokeless tobacco: Never Used  Substance Use Topics  . Alcohol use: Not on file  . Drug use: No         OPHTHALMIC EXAM:  Base Eye Exam    Visual Acuity (Snellen - Linear)      Right Left   Dist [place] HM HM   Dist ph  NI NI       Tonometry (Tonopen, 2:53 PM)      Right Left   Pressure 17 18       Pupils      Dark Light Shape React APD   Right 2 1 Round Minimal Trace   Left 2 1 Round Minimal Trace       Visual Fields (Counting fingers)      Left Right   Restrictions Total superior temporal, inferior temporal, superior nasal, inferior nasal deficiencies Total superior temporal, inferior temporal, superior nasal, inferior nasal deficiencies       Extraocular Movement      Right Left    Nystagmus Nystagmus    -- -- --  --  --  -- -- --   -- -- --  --  --  -- -- --         Neuro/Psych    Oriented x3:  Yes   Mood/Affect:  Normal       Dilation    Both eyes:  1.0% Mydriacyl, 2.5% Phenylephrine @ 2:53 PM        Slit Lamp and Fundus Exam    Slit Lamp Exam      Right Left   Lids/Lashes Dermatochalasis - upper lid, mild Meibomian gland dysfunction Mild Meibomian gland dysfunction   Conjunctiva/Sclera Mild Melanosis, otherwise normal Nasal and Temporal Pinguecula   Cornea Mild Arcus, otherwise clear Mild Arcus, otherwise clear   Anterior Chamber Deep and quiet Deep and quiet   Iris Round and dilated, No NVI Round and dilated, No NVI   Lens 3+ Nuclear sclerosis, 2+ Cortical cataract, 2+ Posterior subcapsular cataract, Vacuoles 2+ Nuclear sclerosis, 1+ Cortical cataract, Vacuoles   Vitreous Mild Vitreous syneresis Mild Vitreous  syneresis       Fundus Exam      Right Left   Disc fibrosis overlying Pallor, mild fibrosis   Macula Wolf jaw TRD with macular hole, macula completely detached Flat, atrophic, fibrosis and pigmented scarring temporal half of macula   Vessels Severe Vascular attenuation Severe Vascular attenuation   Periphery Attached,  360 PRP, room for PRP fill-in peripherally Attached, 360 PRP, fiborsis nasal to disc, room for PRP fill-in superiorly          IMAGING AND PROCEDURES  Imaging and Procedures for _0 @  OCT, Retina - OU - Both Eyes       Right Eye Quality was poor (uninterruptable ). Findings include preretinal fibrosis, subretinal fluid, vitreous traction.   Left Eye Quality was good. Central Foveal Thickness: 256. Progression has been stable. Findings include abnormal foveal contour, epiretinal membrane, lamellar hole, outer retinal atrophy, retinal drusen .   Notes *Images captured and stored on drive  Diagnosis / Impression:  OD: fibrosis with tractional retinal detachment OS: diffuse retinal atrophy, ERM with lamellar hole  Clinical management:  See below  Abbreviations: NFP - Normal foveal profile. CME - cystoid macular edema. PED - pigment epithelial detachment. IRF - intraretinal fluid. SRF - subretinal fluid. EZ - ellipsoid zone. ERM - epiretinal membrane. ORA - outer retinal atrophy. ORT - outer retinal tubulation. SRHM - subretinal hyper-reflective material                  ASSESSMENT/PLAN:    ICD-10-CM   1. 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 Retinal detachment, tractional, right eye H33.41   3. Macular hole of right eye H35.341   4. Stable proliferative diabetic retinopathy of left eye associated with type 2 diabetes mellitus ([place]) G83.6629   5. Retinal edema H35.81 OCT, Retina -  OU - Both Eyes  6. Low vision, both eyes H54.3     1-3. 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D - pt with long standing history of low vision / legal blindness - onset of vision loss 5 yrs ago in [place] -- underwent PRP laser OD, PPV OS  - today, reports no acute change in vision OD, but on exam has a complete macular detachment with macular hole -- wolf-jaw fibrosis emanating from the disc - periphery attached with good PRP in place - discussed findings, very guarded prognosis, and treatment options - given longstanding VA of HM OD, unlikely that anatomic reattachment of retina will yield significantly improved vision - discussed realistic goal of surgery would be to prevent phthisis - pt not that interested in surgery, but encouraged second opinion -- pt in agreement - will refer to Dr. Dwana Melena, expert retina surgeon for second opinion / surgical eval - f/u 4 weeks, sooner prn, to monitor for any acute changes in symptoms or exam  4. Stable PDR OS - s/p PPV and laser PRP OS ~5 yrs ago in [place] - significant macular atrophy OS - no active disease at this time - room for PRP fill in peripherally if needed - monitor  5. Mild non-central retinal edema OS - monitor  6. 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      Return for F/U PDR w/ TRD/RRD and mac hole OD, DFE, OCT.  There are no Patient Instructions on file for this visit.   Explained the diagnoses, plan, and follow up with the patient and they expressed understanding.  Patient expressed understanding of the importance  of proper follow up care.   This document serves as a record of services personally performed by Gardiner Sleeper, MD, PhD. It was created on their behalf by Catha Brow, [place], a  certified ophthalmic assistant. The creation of this record is the provider's dictation and/or activities during the visit.  Electronically signed by: Catha Brow, [place]  07.02.19 3:44 PM   Gardiner Sleeper, M.D., Ph.D. Diseases & Surgery of the Retina and Vitreous [facility]  I have reviewed the above documentation for accuracy and completeness, and I agree with the above. Gardiner Sleeper, M.D., Ph.D. 09/22/17 3:44 PM    Abbreviations: M myopia (nearsighted); A astigmatism; H hyperopia (farsighted); P presbyopia; Mrx spectacle prescription;  CTL contact lenses; OD right eye; OS left eye; OU both eyes  XT exotropia; ET esotropia; PEK punctate epithelial keratitis; PEE punctate epithelial erosions; DES dry eye syndrome; MGD meibomian gland dysfunction; ATs artificial tears; PFAT's preservative free artificial tears; [place] nuclear sclerotic cataract; PSC posterior subcapsular cataract; ERM epi-retinal membrane; PVD posterior vitreous detachment; RD retinal detachment; DM diabetes mellitus; DR diabetic retinopathy; NPDR non-proliferative diabetic retinopathy; PDR proliferative diabetic retinopathy; CSME clinically significant macular edema; DME diabetic macular edema; dbh dot blot hemorrhages; CWS cotton wool spot; POAG primary open angle glaucoma; C/D cup-to-disc ratio; HVF humphrey visual field; GVF goldmann visual field; OCT optical coherence tomography; IOP intraocular pressure; BRVO Branch retinal vein occlusion; CRVO central retinal vein occlusion; CRAO central retinal artery occlusion; BRAO branch retinal artery occlusion; RT retinal tear; SB scleral buckle; PPV pars plana vitrectomy; VH Vitreous hemorrhage; PRP panretinal laser photocoagulation; IVK intravitreal kenalog; VMT vitreomacular traction; MH Macular hole;  NVD neovascularization of the disc; NVE neovascularization elsewhere; AREDS age related eye disease study; ARMD age related macular degeneration; POAG primary  open angle glaucoma; EBMD epithelial/anterior basement membrane dystrophy; ACIOL anterior chamber intraocular lens; IOL intraocular lens; PCIOL posterior chamber intraocular lens; Phaco/IOL phacoemulsification with intraocular lens placement; [place] photorefractive keratectomy; LASIK laser assisted in situ keratomileusis; HTN hypertension; DM diabetes mellitus; COPD chronic obstructive pulmonary disease

## 2017-09-17 NOTE — Telephone Encounter (Signed)
Will forward to pcp

## 2017-09-17 NOTE — Telephone Encounter (Signed)
Patient called requesting a [facility] Health Care Ref[facility] . Please, call her at   [phone] Thank You

## 2017-09-18 ENCOUNTER — Encounter (INDEPENDENT_AMBULATORY_CARE_PROVIDER_SITE_OTHER): Payer: Self-pay | Admitting: Ophthalmology

## 2017-09-18 ENCOUNTER — Ambulatory Visit (INDEPENDENT_AMBULATORY_CARE_PROVIDER_SITE_OTHER): Payer: Medicaid Other | Admitting: Ophthalmology

## 2017-09-18 DIAGNOSIS — E113552 Type 2 diabetes mellitus with stable proliferative diabetic retinopathy, left eye: Secondary | ICD-10-CM | POA: Diagnosis not present

## 2017-09-18 DIAGNOSIS — H543 Unqualified visual loss, both eyes: Secondary | ICD-10-CM

## 2017-09-18 DIAGNOSIS — H35341 Macular cyst, hole, or pseudohole, right eye: Secondary | ICD-10-CM | POA: Diagnosis not present

## 2017-09-18 DIAGNOSIS — H3341 Traction detachment of retina, right eye: Secondary | ICD-10-CM | POA: Diagnosis not present

## 2017-09-18 DIAGNOSIS — E113541 Type 2 diabetes mellitus with proliferative diabetic retinopathy with combined traction retinal detachment and rhegmatogenous retinal detachment, right eye: Secondary | ICD-10-CM | POA: Diagnosis not present

## 2017-09-18 DIAGNOSIS — H3581 Retinal edema: Secondary | ICD-10-CM | POA: Diagnosis not present

## 2017-09-18 NOTE — Telephone Encounter (Signed)
Call placed to the patient to inquire about PCS.  She said that she was not home for the initial evaluation [facility] so she has not been receiving services. When she contacted [facility], they informed her that a new referral needs to be submitted   This CM informed her that the PCP would be notified and a new PCS application can be submitted if her PCP is in agreement.

## 2017-09-20 NOTE — Telephone Encounter (Signed)
Call placed to patient #[phone] regarding PCS services and ADL's. No answer. Left patient a message asking her to return my call at [phone]. Patient can also speak with Laural BenesJane B., case manager.

## 2017-09-22 ENCOUNTER — Encounter (INDEPENDENT_AMBULATORY_CARE_PROVIDER_SITE_OTHER): Payer: Self-pay | Admitting: Ophthalmology

## 2017-09-27 NOTE — Telephone Encounter (Signed)
Call placed to the patient to inquire what [facility]  She explained that she needs help with bathing/personal care and light housekeeping.  She lives alone and stated that she doesn't have anyone to assist her.  She further explained that a referral for PCS was made by her prior PCP, the [facility] and they tried to contact her to schedule an assessment but she was out of town.  [facility] now requesting a new [facility]. Informed her that this information would be shared with Dr Laural BenesJohnson.

## 2017-10-02 DIAGNOSIS — B351 Tinea unguium: Secondary | ICD-10-CM | POA: Diagnosis not present

## 2017-10-02 DIAGNOSIS — L03032 Cellulitis of left toe: Secondary | ICD-10-CM | POA: Diagnosis not present

## 2017-10-02 DIAGNOSIS — L03031 Cellulitis of right toe: Secondary | ICD-10-CM | POA: Diagnosis not present

## 2017-10-07 ENCOUNTER — Telehealth: Payer: Self-pay

## 2017-10-07 NOTE — Telephone Encounter (Signed)
PCS request faxed to [facility] 

## 2017-10-11 ENCOUNTER — Telehealth: Payer: Self-pay

## 2017-10-11 NOTE — Telephone Encounter (Signed)
Call placed to [facility], spoke to Jobrick and confirmed receipt of the [facility] referral.  He stated that the home assessment was done today.

## 2017-10-15 DIAGNOSIS — E1142 Type 2 diabetes mellitus with diabetic polyneuropathy: Secondary | ICD-10-CM | POA: Diagnosis not present

## 2017-10-16 DIAGNOSIS — E1142 Type 2 diabetes mellitus with diabetic polyneuropathy: Secondary | ICD-10-CM | POA: Diagnosis not present

## 2017-10-17 DIAGNOSIS — E1142 Type 2 diabetes mellitus with diabetic polyneuropathy: Secondary | ICD-10-CM | POA: Diagnosis not present

## 2017-10-18 DIAGNOSIS — E1142 Type 2 diabetes mellitus with diabetic polyneuropathy: Secondary | ICD-10-CM | POA: Diagnosis not present

## 2017-10-21 DIAGNOSIS — E1142 Type 2 diabetes mellitus with diabetic polyneuropathy: Secondary | ICD-10-CM | POA: Diagnosis not present

## 2017-10-22 DIAGNOSIS — E1142 Type 2 diabetes mellitus with diabetic polyneuropathy: Secondary | ICD-10-CM | POA: Diagnosis not present

## 2017-10-23 DIAGNOSIS — E1142 Type 2 diabetes mellitus with diabetic polyneuropathy: Secondary | ICD-10-CM | POA: Diagnosis not present

## 2017-10-24 DIAGNOSIS — E1142 Type 2 diabetes mellitus with diabetic polyneuropathy: Secondary | ICD-10-CM | POA: Diagnosis not present

## 2017-10-25 DIAGNOSIS — E1142 Type 2 diabetes mellitus with diabetic polyneuropathy: Secondary | ICD-10-CM | POA: Diagnosis not present

## 2017-10-28 DIAGNOSIS — E1142 Type 2 diabetes mellitus with diabetic polyneuropathy: Secondary | ICD-10-CM | POA: Diagnosis not present

## 2017-10-29 DIAGNOSIS — E1142 Type 2 diabetes mellitus with diabetic polyneuropathy: Secondary | ICD-10-CM | POA: Diagnosis not present

## 2017-10-30 DIAGNOSIS — E1142 Type 2 diabetes mellitus with diabetic polyneuropathy: Secondary | ICD-10-CM | POA: Diagnosis not present

## 2017-10-31 DIAGNOSIS — E1142 Type 2 diabetes mellitus with diabetic polyneuropathy: Secondary | ICD-10-CM | POA: Diagnosis not present

## 2017-11-01 DIAGNOSIS — E1142 Type 2 diabetes mellitus with diabetic polyneuropathy: Secondary | ICD-10-CM | POA: Diagnosis not present

## 2017-11-04 DIAGNOSIS — E1142 Type 2 diabetes mellitus with diabetic polyneuropathy: Secondary | ICD-10-CM | POA: Diagnosis not present

## 2017-11-05 DIAGNOSIS — E1142 Type 2 diabetes mellitus with diabetic polyneuropathy: Secondary | ICD-10-CM | POA: Diagnosis not present

## 2017-11-06 DIAGNOSIS — E1142 Type 2 diabetes mellitus with diabetic polyneuropathy: Secondary | ICD-10-CM | POA: Diagnosis not present

## 2017-11-07 DIAGNOSIS — E1142 Type 2 diabetes mellitus with diabetic polyneuropathy: Secondary | ICD-10-CM | POA: Diagnosis not present

## 2017-11-08 DIAGNOSIS — B351 Tinea unguium: Secondary | ICD-10-CM | POA: Diagnosis not present

## 2017-11-08 DIAGNOSIS — L03032 Cellulitis of left toe: Secondary | ICD-10-CM | POA: Diagnosis not present

## 2017-11-08 DIAGNOSIS — E1142 Type 2 diabetes mellitus with diabetic polyneuropathy: Secondary | ICD-10-CM | POA: Diagnosis not present

## 2017-11-08 DIAGNOSIS — L03031 Cellulitis of right toe: Secondary | ICD-10-CM | POA: Diagnosis not present

## 2017-11-11 DIAGNOSIS — E1142 Type 2 diabetes mellitus with diabetic polyneuropathy: Secondary | ICD-10-CM | POA: Diagnosis not present

## 2017-11-12 DIAGNOSIS — E1142 Type 2 diabetes mellitus with diabetic polyneuropathy: Secondary | ICD-10-CM | POA: Diagnosis not present

## 2017-11-13 DIAGNOSIS — E1142 Type 2 diabetes mellitus with diabetic polyneuropathy: Secondary | ICD-10-CM | POA: Diagnosis not present

## 2017-11-14 DIAGNOSIS — E1142 Type 2 diabetes mellitus with diabetic polyneuropathy: Secondary | ICD-10-CM | POA: Diagnosis not present

## 2017-11-15 DIAGNOSIS — E1142 Type 2 diabetes mellitus with diabetic polyneuropathy: Secondary | ICD-10-CM | POA: Diagnosis not present

## 2017-11-18 DIAGNOSIS — E1142 Type 2 diabetes mellitus with diabetic polyneuropathy: Secondary | ICD-10-CM | POA: Diagnosis not present

## 2017-11-19 DIAGNOSIS — E1142 Type 2 diabetes mellitus with diabetic polyneuropathy: Secondary | ICD-10-CM | POA: Diagnosis not present

## 2017-11-20 DIAGNOSIS — E1142 Type 2 diabetes mellitus with diabetic polyneuropathy: Secondary | ICD-10-CM | POA: Diagnosis not present

## 2017-11-21 DIAGNOSIS — E1142 Type 2 diabetes mellitus with diabetic polyneuropathy: Secondary | ICD-10-CM | POA: Diagnosis not present

## 2017-11-22 DIAGNOSIS — E1142 Type 2 diabetes mellitus with diabetic polyneuropathy: Secondary | ICD-10-CM | POA: Diagnosis not present

## 2017-11-25 DIAGNOSIS — E1142 Type 2 diabetes mellitus with diabetic polyneuropathy: Secondary | ICD-10-CM | POA: Diagnosis not present

## 2017-11-26 DIAGNOSIS — E1142 Type 2 diabetes mellitus with diabetic polyneuropathy: Secondary | ICD-10-CM | POA: Diagnosis not present

## 2017-11-27 DIAGNOSIS — E1142 Type 2 diabetes mellitus with diabetic polyneuropathy: Secondary | ICD-10-CM | POA: Diagnosis not present

## 2017-11-28 DIAGNOSIS — E1142 Type 2 diabetes mellitus with diabetic polyneuropathy: Secondary | ICD-10-CM | POA: Diagnosis not present

## 2017-11-29 DIAGNOSIS — E1142 Type 2 diabetes mellitus with diabetic polyneuropathy: Secondary | ICD-10-CM | POA: Diagnosis not present

## 2017-12-02 DIAGNOSIS — E1142 Type 2 diabetes mellitus with diabetic polyneuropathy: Secondary | ICD-10-CM | POA: Diagnosis not present

## 2017-12-03 DIAGNOSIS — E1142 Type 2 diabetes mellitus with diabetic polyneuropathy: Secondary | ICD-10-CM | POA: Diagnosis not present

## 2017-12-04 DIAGNOSIS — E1142 Type 2 diabetes mellitus with diabetic polyneuropathy: Secondary | ICD-10-CM | POA: Diagnosis not present

## 2017-12-05 DIAGNOSIS — E1142 Type 2 diabetes mellitus with diabetic polyneuropathy: Secondary | ICD-10-CM | POA: Diagnosis not present

## 2017-12-06 DIAGNOSIS — E1142 Type 2 diabetes mellitus with diabetic polyneuropathy: Secondary | ICD-10-CM | POA: Diagnosis not present

## 2017-12-09 DIAGNOSIS — E1142 Type 2 diabetes mellitus with diabetic polyneuropathy: Secondary | ICD-10-CM | POA: Diagnosis not present

## 2017-12-10 DIAGNOSIS — E1142 Type 2 diabetes mellitus with diabetic polyneuropathy: Secondary | ICD-10-CM | POA: Diagnosis not present

## 2017-12-11 DIAGNOSIS — E1142 Type 2 diabetes mellitus with diabetic polyneuropathy: Secondary | ICD-10-CM | POA: Diagnosis not present

## 2017-12-12 DIAGNOSIS — E1142 Type 2 diabetes mellitus with diabetic polyneuropathy: Secondary | ICD-10-CM | POA: Diagnosis not present

## 2017-12-13 DIAGNOSIS — E1142 Type 2 diabetes mellitus with diabetic polyneuropathy: Secondary | ICD-10-CM | POA: Diagnosis not present

## 2017-12-16 DIAGNOSIS — E1142 Type 2 diabetes mellitus with diabetic polyneuropathy: Secondary | ICD-10-CM | POA: Diagnosis not present

## 2017-12-17 DIAGNOSIS — E1142 Type 2 diabetes mellitus with diabetic polyneuropathy: Secondary | ICD-10-CM | POA: Diagnosis not present

## 2017-12-18 DIAGNOSIS — E1142 Type 2 diabetes mellitus with diabetic polyneuropathy: Secondary | ICD-10-CM | POA: Diagnosis not present

## 2017-12-19 DIAGNOSIS — E1142 Type 2 diabetes mellitus with diabetic polyneuropathy: Secondary | ICD-10-CM | POA: Diagnosis not present

## 2017-12-20 DIAGNOSIS — E1142 Type 2 diabetes mellitus with diabetic polyneuropathy: Secondary | ICD-10-CM | POA: Diagnosis not present

## 2017-12-23 DIAGNOSIS — E1142 Type 2 diabetes mellitus with diabetic polyneuropathy: Secondary | ICD-10-CM | POA: Diagnosis not present

## 2017-12-24 DIAGNOSIS — E1142 Type 2 diabetes mellitus with diabetic polyneuropathy: Secondary | ICD-10-CM | POA: Diagnosis not present

## 2017-12-25 DIAGNOSIS — E1142 Type 2 diabetes mellitus with diabetic polyneuropathy: Secondary | ICD-10-CM | POA: Diagnosis not present

## 2017-12-26 DIAGNOSIS — E1142 Type 2 diabetes mellitus with diabetic polyneuropathy: Secondary | ICD-10-CM | POA: Diagnosis not present

## 2017-12-27 DIAGNOSIS — E1142 Type 2 diabetes mellitus with diabetic polyneuropathy: Secondary | ICD-10-CM | POA: Diagnosis not present

## 2017-12-30 DIAGNOSIS — E1142 Type 2 diabetes mellitus with diabetic polyneuropathy: Secondary | ICD-10-CM | POA: Diagnosis not present

## 2017-12-31 DIAGNOSIS — E1142 Type 2 diabetes mellitus with diabetic polyneuropathy: Secondary | ICD-10-CM | POA: Diagnosis not present

## 2018-01-01 DIAGNOSIS — E1142 Type 2 diabetes mellitus with diabetic polyneuropathy: Secondary | ICD-10-CM | POA: Diagnosis not present

## 2018-01-02 DIAGNOSIS — E1142 Type 2 diabetes mellitus with diabetic polyneuropathy: Secondary | ICD-10-CM | POA: Diagnosis not present

## 2018-01-03 DIAGNOSIS — E1142 Type 2 diabetes mellitus with diabetic polyneuropathy: Secondary | ICD-10-CM | POA: Diagnosis not present

## 2018-01-06 DIAGNOSIS — E1142 Type 2 diabetes mellitus with diabetic polyneuropathy: Secondary | ICD-10-CM | POA: Diagnosis not present

## 2018-01-07 DIAGNOSIS — E1142 Type 2 diabetes mellitus with diabetic polyneuropathy: Secondary | ICD-10-CM | POA: Diagnosis not present

## 2018-01-08 DIAGNOSIS — E1142 Type 2 diabetes mellitus with diabetic polyneuropathy: Secondary | ICD-10-CM | POA: Diagnosis not present

## 2018-01-09 DIAGNOSIS — E1142 Type 2 diabetes mellitus with diabetic polyneuropathy: Secondary | ICD-10-CM | POA: Diagnosis not present

## 2018-01-10 DIAGNOSIS — E1142 Type 2 diabetes mellitus with diabetic polyneuropathy: Secondary | ICD-10-CM | POA: Diagnosis not present

## 2018-01-13 DIAGNOSIS — E1142 Type 2 diabetes mellitus with diabetic polyneuropathy: Secondary | ICD-10-CM | POA: Diagnosis not present

## 2018-01-14 DIAGNOSIS — E1142 Type 2 diabetes mellitus with diabetic polyneuropathy: Secondary | ICD-10-CM | POA: Diagnosis not present

## 2018-01-15 DIAGNOSIS — E1142 Type 2 diabetes mellitus with diabetic polyneuropathy: Secondary | ICD-10-CM | POA: Diagnosis not present

## 2018-01-16 DIAGNOSIS — E1142 Type 2 diabetes mellitus with diabetic polyneuropathy: Secondary | ICD-10-CM | POA: Diagnosis not present

## 2018-01-17 ENCOUNTER — Ambulatory Visit: Payer: Medicaid Other | Attending: Internal Medicine | Admitting: Internal Medicine

## 2018-01-17 ENCOUNTER — Encounter: Payer: Self-pay | Admitting: Internal Medicine

## 2018-01-17 VITALS — BP 162/96 | HR 79 | Temp 98.2°F | Resp 16 | Wt 229.4 lb

## 2018-01-17 DIAGNOSIS — R0683 Snoring: Secondary | ICD-10-CM | POA: Diagnosis not present

## 2018-01-17 DIAGNOSIS — M79605 Pain in left leg: Secondary | ICD-10-CM | POA: Diagnosis not present

## 2018-01-17 DIAGNOSIS — Z794 Long term (current) use of insulin: Secondary | ICD-10-CM | POA: Insufficient documentation

## 2018-01-17 DIAGNOSIS — E11319 Type 2 diabetes mellitus with unspecified diabetic retinopathy without macular edema: Secondary | ICD-10-CM | POA: Insufficient documentation

## 2018-01-17 DIAGNOSIS — H548 Legal blindness, as defined in USA: Secondary | ICD-10-CM | POA: Diagnosis not present

## 2018-01-17 DIAGNOSIS — E1140 Type 2 diabetes mellitus with diabetic neuropathy, unspecified: Secondary | ICD-10-CM | POA: Insufficient documentation

## 2018-01-17 DIAGNOSIS — I10 Essential (primary) hypertension: Secondary | ICD-10-CM | POA: Diagnosis not present

## 2018-01-17 DIAGNOSIS — Z2821 Immunization not carried out because of patient refusal: Secondary | ICD-10-CM

## 2018-01-17 DIAGNOSIS — M7918 Myalgia, other site: Secondary | ICD-10-CM

## 2018-01-17 DIAGNOSIS — E1142 Type 2 diabetes mellitus with diabetic polyneuropathy: Secondary | ICD-10-CM | POA: Diagnosis not present

## 2018-01-17 DIAGNOSIS — M549 Dorsalgia, unspecified: Secondary | ICD-10-CM | POA: Diagnosis present

## 2018-01-17 DIAGNOSIS — Z79899 Other long term (current) drug therapy: Secondary | ICD-10-CM | POA: Insufficient documentation

## 2018-01-17 MED ORDER — AMLODIPINE BESYLATE 5 MG PO TABS: 5.0000 mg | ORAL_TABLET | Freq: Every day | ORAL | 3 refills | Status: DC

## 2018-01-17 MED ORDER — DICLOFENAC SODIUM 1 % TD GEL: 2.0000 g | Freq: Four times a day (QID) | TRANSDERMAL | 0 refills | Status: DC

## 2018-01-17 NOTE — Patient Instructions (Signed)
-   Start Amlodipine 5 mg daily for blood pressure

## 2018-01-17 NOTE — Progress Notes (Signed)
Patient ID: Lisa Olson, female    DOB: [birth year]  MRN: [id number]  CC: Back Pain   Subjective: Lisa Olson is a 57 y.o. female who presents for chronic ds management Her concerns today include:  Patient with history of DM type II with associated peripheral neuropathy and retinopathy, legally blind in both eyes, HTN, HL,    Pt c/o pain LT shin that started this a.m after she accidentally tripped over cane.  She did not fall.  Told by friends that she snores loud and stops breathing intermittently during sleep Feels refresh in the mornings and denies any morning headaches.  Gets a lot of cramp in legs mainly the thigh RT>LT leg that stay a while Occur every few days and last few mins.  Denies any pain or cramping in the muscles with walking.  She is on statin.  C/o intermittent  grabbing pain/soreness below LT scaplular that she has had for over 1 yr.  Now present more often.  She describes it as a soreness along with a slight burning sensation.  It is worse when she is actively moving around.  It is better with sitting and lying down.  She has not tried taking anything for her symptoms.    HTN:  Compliant with Lisinopril.  Admits that she is not limiting salt as much as advised.  No chest pains or shortness of breath  She has a son who lives in [place] and the other resides in the [place].  They are concerned about her living alone and wants her to get 24-hour care.  She does not feel that she needs it at this time.  She has an aide who comes out 5 days a week for 2 to 3 hours to assist with personal care.  She is thinking about having her son who is in [place] relocate here.  .  Patient Active Problem List   Diagnosis Date Noted  . Legal blindness 07/25/2017  . Type 2 diabetes mellitus with retinopathy of both eyes, with long-term current use of insulin ([place]) 02/14/2016  . Essential hypertension 02/14/2016  . Onychomycosis due to dermatophyte 03/25/2013  . Hallux valgus, acquired  03/25/2013     Current Outpatient Medications on File Prior to Visit  Medication Sig Dispense Refill  . ACCU-CHEK SOFTCLIX LANCETS lancets Use as instructed 100 each 12  . atorvastatin (LIPITOR) 20 MG tablet Take 1 tablet (20 mg total) by mouth daily. 30 tablet 2  . Blood Glucose Monitoring Suppl (ACCU-CHEK AVIVA PLUS) w/Device KIT Use as directed 1 kit 0  . Calcium 600-400 MG-UNIT CHEW Chew 1 tablet by mouth daily. 60 tablet 2  . ferrous sulfate 325 (65 FE) MG tablet Take 1 tablet (325 mg total) by mouth daily with breakfast. 100 tablet 3  . glimepiride (AMARYL) 4 MG tablet Take 1 tablet (4 mg total) by mouth daily with breakfast. 90 tablet 3  . glucose blood (ACCU-CHEK AVIVA PLUS) test strip Use as instructed for 3 times daily testing of blood sugar. E11.9 100 each 12  . hydrOXYzine (ATARAX/VISTARIL) 10 MG tablet Take 1 tablet (10 mg total) by mouth 3 (three) times daily as needed for itching or anxiety. 30 tablet 0  . Lancet Devices (ACCU-CHEK SOFTCLIX) lancets Use as instructed 1 each 0  . lisinopril (PRINIVIL,ZESTRIL) 30 MG tablet Take 1 tablet (30 mg total) by mouth daily. 90 tablet 3  . metFORMIN (GLUCOPHAGE) 1000 MG tablet Take 1 tablet (1,000 mg total) by mouth 2 (two) times  daily with a meal. 180 tablet 3  . Multiple Vitamins-Minerals (MULTIVITAMIN WITH MINERALS) tablet Take 1 tablet by mouth daily.    Marland Kitchen terbinafine (LAMISIL) 250 MG tablet Take 250 mg by mouth daily.  0   No current facility-administered medications on file prior to visit.     No Known Allergies  Social History   Socioeconomic History  . Marital status: Single    Spouse name: Not on file  . Number of children: Not on file  . Years of education: Not on file  . Highest education level: Not on file  Occupational History  . Not on file  Social Needs  . Financial resource strain: Not on file  . Food insecurity:    Worry: Not on file    Inability: Not on file  . Transportation needs:    Medical: Not on  file    Non-medical: Not on file  Tobacco Use  . Smoking status: Never Smoker  . Smokeless tobacco: Never Used  Substance and Sexual Activity  . Alcohol use: Not on file  . Drug use: No  . Sexual activity: Not on file  Lifestyle  . Physical activity:    Days per week: Not on file    Minutes per session: Not on file  . Stress: Not on file  Relationships  . Social connections:    Talks on phone: Not on file    Gets together: Not on file    Attends religious service: Not on file    Active member of club or organization: Not on file    Attends meetings of clubs or organizations: Not on file    Relationship status: Not on file  . Intimate partner violence:    Fear of current or ex partner: Not on file    Emotionally abused: Not on file    Physically abused: Not on file    Forced sexual activity: Not on file  Other Topics Concern  . Not on file  Social History Narrative  . Not on file    Family History  Problem Relation Age of Onset  . Diabetes Mother   . Diabetes Father   . Hypertension Sister     Past Surgical History:  Procedure Laterality Date  . EYE SURGERY    . LASIK    . RETINAL DETACHMENT SURGERY      ROS: Review of Systems Negative except as above PHYSICAL EXAM: BP (!) 162/96   Pulse 79   Temp 98.2 F (36.8 C) (Oral)   Resp 16   Wt 229 lb 6.4 oz (104.1 kg)   SpO2 100%   BMI 34.88 kg/m   Repeat blood pressure BP 156/87 Physical Exam  General appearance - alert, well appearing, and in no distress Mental status - normal mood, behavior, speech, dress, motor activity, and thought processes Chest - clear to auscultation, no wheezes, rales or rhonchi, symmetric air entry Heart - normal rate, regular rhythm, normal S1, S2, no murmurs, rubs, clicks or gallops Musculoskeletal -left lower extremity: No edema or bruising noted.  She has mild tenderness on palpation of the mid shin. Mild tenderness on palpation of paraspinal muscle below and medial to the  left scapula.  She has good range of motion of the left shoulder. Extremities - peripheral pulses normal, no pedal edema, no clubbing or cyanosis   ASSESSMENT AND PLAN: 1. Essential hypertension Not at goal.  Recommend adding amlodipine 5 mg daily.  DASH diet discussed and encouraged - amLODipine (NORVASC) 5  MG tablet; Take 1 tablet (5 mg total) by mouth daily.  Dispense: 90 tablet; Refill: 3  2. Myofascial pain on left side Soreness below the left scapula seems myofascial in nature.  We will try her with Voltaren gel  3. Loud snoring Symptoms suggest obstructive sleep apnea.  I have discussed this diagnosis with patient and recommended sleep study.  Patient wants to hold off on that for now  4. Influenza vaccination declined  5. Leg pain, left I think this is a mild contusion from where this part of her leg hip with a cane.  I recommend icing.  In regards to her living situation, given that she is legally blind it is probably not a bad idea to have a close family member living with her daughter nearby.  She will give some thought to having her son moved here from [place].  She will follow-up in 1 month for recheck of blood pressure and evaluation of her diabetes  Patient was given the opportunity to ask questions.  Patient verbalized understanding of the plan and was able to repeat key elements of the plan.   No orders of the defined types were placed in this encounter.    Requested Prescriptions   Signed Prescriptions Disp Refills  . amLODipine (NORVASC) 5 MG tablet 90 tablet 3    Sig: Take 1 tablet (5 mg total) by mouth daily.  . diclofenac sodium (VOLTAREN) 1 % GEL 100 g 0    Sig: Apply 2 g topically 4 (four) times daily.    Return in about 1 month (around 02/16/2018).  Karle Plumber, MD, FACP

## 2018-01-20 DIAGNOSIS — E1142 Type 2 diabetes mellitus with diabetic polyneuropathy: Secondary | ICD-10-CM | POA: Diagnosis not present

## 2018-01-21 DIAGNOSIS — E1142 Type 2 diabetes mellitus with diabetic polyneuropathy: Secondary | ICD-10-CM | POA: Diagnosis not present

## 2018-01-22 DIAGNOSIS — E1142 Type 2 diabetes mellitus with diabetic polyneuropathy: Secondary | ICD-10-CM | POA: Diagnosis not present

## 2018-01-23 DIAGNOSIS — E1142 Type 2 diabetes mellitus with diabetic polyneuropathy: Secondary | ICD-10-CM | POA: Diagnosis not present

## 2018-01-24 ENCOUNTER — Other Ambulatory Visit: Payer: Self-pay

## 2018-01-24 DIAGNOSIS — E1142 Type 2 diabetes mellitus with diabetic polyneuropathy: Secondary | ICD-10-CM | POA: Diagnosis not present

## 2018-01-27 DIAGNOSIS — E1142 Type 2 diabetes mellitus with diabetic polyneuropathy: Secondary | ICD-10-CM | POA: Diagnosis not present

## 2018-01-28 DIAGNOSIS — E1142 Type 2 diabetes mellitus with diabetic polyneuropathy: Secondary | ICD-10-CM | POA: Diagnosis not present

## 2018-01-29 DIAGNOSIS — E1142 Type 2 diabetes mellitus with diabetic polyneuropathy: Secondary | ICD-10-CM | POA: Diagnosis not present

## 2018-01-30 DIAGNOSIS — E1142 Type 2 diabetes mellitus with diabetic polyneuropathy: Secondary | ICD-10-CM | POA: Diagnosis not present

## 2018-01-31 DIAGNOSIS — E1142 Type 2 diabetes mellitus with diabetic polyneuropathy: Secondary | ICD-10-CM | POA: Diagnosis not present

## 2018-02-03 DIAGNOSIS — E1142 Type 2 diabetes mellitus with diabetic polyneuropathy: Secondary | ICD-10-CM | POA: Diagnosis not present

## 2018-02-04 DIAGNOSIS — E1142 Type 2 diabetes mellitus with diabetic polyneuropathy: Secondary | ICD-10-CM | POA: Diagnosis not present

## 2018-02-05 DIAGNOSIS — E1142 Type 2 diabetes mellitus with diabetic polyneuropathy: Secondary | ICD-10-CM | POA: Diagnosis not present

## 2018-02-06 DIAGNOSIS — E1142 Type 2 diabetes mellitus with diabetic polyneuropathy: Secondary | ICD-10-CM | POA: Diagnosis not present

## 2018-02-07 DIAGNOSIS — E1142 Type 2 diabetes mellitus with diabetic polyneuropathy: Secondary | ICD-10-CM | POA: Diagnosis not present

## 2018-02-10 DIAGNOSIS — E1142 Type 2 diabetes mellitus with diabetic polyneuropathy: Secondary | ICD-10-CM | POA: Diagnosis not present

## 2018-02-11 DIAGNOSIS — H2513 Age-related nuclear cataract, bilateral: Secondary | ICD-10-CM | POA: Diagnosis not present

## 2018-02-11 DIAGNOSIS — H3341 Traction detachment of retina, right eye: Secondary | ICD-10-CM | POA: Insufficient documentation

## 2018-02-11 DIAGNOSIS — E113523 Type 2 diabetes mellitus with proliferative diabetic retinopathy with traction retinal detachment involving the macula, bilateral: Secondary | ICD-10-CM | POA: Diagnosis not present

## 2018-02-11 DIAGNOSIS — E1142 Type 2 diabetes mellitus with diabetic polyneuropathy: Secondary | ICD-10-CM | POA: Diagnosis not present

## 2018-02-11 DIAGNOSIS — H35342 Macular cyst, hole, or pseudohole, left eye: Secondary | ICD-10-CM | POA: Diagnosis not present

## 2018-02-11 DIAGNOSIS — H4311 Vitreous hemorrhage, right eye: Secondary | ICD-10-CM | POA: Insufficient documentation

## 2018-02-11 DIAGNOSIS — H3581 Retinal edema: Secondary | ICD-10-CM | POA: Insufficient documentation

## 2018-02-11 DIAGNOSIS — H35372 Puckering of macula, left eye: Secondary | ICD-10-CM | POA: Diagnosis not present

## 2018-02-12 DIAGNOSIS — E1142 Type 2 diabetes mellitus with diabetic polyneuropathy: Secondary | ICD-10-CM | POA: Diagnosis not present

## 2018-02-13 DIAGNOSIS — E1142 Type 2 diabetes mellitus with diabetic polyneuropathy: Secondary | ICD-10-CM | POA: Diagnosis not present

## 2018-02-14 DIAGNOSIS — E1142 Type 2 diabetes mellitus with diabetic polyneuropathy: Secondary | ICD-10-CM | POA: Diagnosis not present

## 2018-02-17 DIAGNOSIS — E1142 Type 2 diabetes mellitus with diabetic polyneuropathy: Secondary | ICD-10-CM | POA: Diagnosis not present

## 2018-02-18 DIAGNOSIS — E1142 Type 2 diabetes mellitus with diabetic polyneuropathy: Secondary | ICD-10-CM | POA: Diagnosis not present

## 2018-02-19 DIAGNOSIS — E1142 Type 2 diabetes mellitus with diabetic polyneuropathy: Secondary | ICD-10-CM | POA: Diagnosis not present

## 2018-02-20 ENCOUNTER — Ambulatory Visit: Payer: Medicaid Other | Attending: Internal Medicine | Admitting: Internal Medicine

## 2018-02-20 ENCOUNTER — Encounter: Payer: Self-pay | Admitting: Internal Medicine

## 2018-02-20 VITALS — BP 149/82 | HR 92 | Temp 98.3°F | Resp 16 | Wt 227.6 lb

## 2018-02-20 DIAGNOSIS — H548 Legal blindness, as defined in USA: Secondary | ICD-10-CM

## 2018-02-20 DIAGNOSIS — Z7984 Long term (current) use of oral hypoglycemic drugs: Secondary | ICD-10-CM | POA: Diagnosis not present

## 2018-02-20 DIAGNOSIS — E11311 Type 2 diabetes mellitus with unspecified diabetic retinopathy with macular edema: Secondary | ICD-10-CM | POA: Diagnosis not present

## 2018-02-20 DIAGNOSIS — Z79899 Other long term (current) drug therapy: Secondary | ICD-10-CM | POA: Diagnosis not present

## 2018-02-20 DIAGNOSIS — Z8249 Family history of ischemic heart disease and other diseases of the circulatory system: Secondary | ICD-10-CM | POA: Insufficient documentation

## 2018-02-20 DIAGNOSIS — E1139 Type 2 diabetes mellitus with other diabetic ophthalmic complication: Secondary | ICD-10-CM | POA: Diagnosis not present

## 2018-02-20 DIAGNOSIS — E11319 Type 2 diabetes mellitus with unspecified diabetic retinopathy without macular edema: Secondary | ICD-10-CM | POA: Diagnosis not present

## 2018-02-20 DIAGNOSIS — I10 Essential (primary) hypertension: Secondary | ICD-10-CM | POA: Insufficient documentation

## 2018-02-20 DIAGNOSIS — Z833 Family history of diabetes mellitus: Secondary | ICD-10-CM | POA: Insufficient documentation

## 2018-02-20 DIAGNOSIS — E08319 Diabetes mellitus due to underlying condition with unspecified diabetic retinopathy without macular edema: Secondary | ICD-10-CM

## 2018-02-20 DIAGNOSIS — E1140 Type 2 diabetes mellitus with diabetic neuropathy, unspecified: Secondary | ICD-10-CM | POA: Diagnosis not present

## 2018-02-20 DIAGNOSIS — E1165 Type 2 diabetes mellitus with hyperglycemia: Secondary | ICD-10-CM | POA: Diagnosis not present

## 2018-02-20 DIAGNOSIS — G8929 Other chronic pain: Secondary | ICD-10-CM | POA: Diagnosis not present

## 2018-02-20 DIAGNOSIS — Z9889 Other specified postprocedural states: Secondary | ICD-10-CM | POA: Insufficient documentation

## 2018-02-20 DIAGNOSIS — R109 Unspecified abdominal pain: Secondary | ICD-10-CM

## 2018-02-20 DIAGNOSIS — E1142 Type 2 diabetes mellitus with diabetic polyneuropathy: Secondary | ICD-10-CM | POA: Diagnosis not present

## 2018-02-20 DIAGNOSIS — IMO0002 Reserved for concepts with insufficient information to code with codable children: Secondary | ICD-10-CM

## 2018-02-20 MED ORDER — GLIMEPIRIDE 4 MG PO TABS: ORAL_TABLET | ORAL | 3 refills | Status: DC

## 2018-02-20 MED ORDER — AMLODIPINE BESYLATE 10 MG PO TABS: 10.0000 mg | ORAL_TABLET | Freq: Every day | ORAL | 5 refills | Status: DC

## 2018-02-20 NOTE — Progress Notes (Signed)
Patient ID: Lisa Olson, female    DOB: Aug 17, 1960  MRN: [id number]  CC: Diabetes and Hypertension   Subjective: Lisa Olson is a 57 y.o. female who presents for one-month follow-up on blood pressure Her concerns today include:  Patient with history ofDMtype II with associated peripheral neuropathy and retinopathy,legally blind in both eyes, HTN, HL,   HTN:  Did get the Amlodipine and tolerating it okay.  She continues to take lisinopril.  Tries to limit salt.  Uses black pepper for seasoning.  She denies any chest pains or shortness of breath.  DM: compliant with Metformin BID and  Amaryl 4 mg daily. Checking BS 2-3 x day. BS this a.m was 138.  Reports morning blood sugars are usually okay but the evening blood sugars are higher.  Drinks juice.  She makes herself fruit and vegetable smoothies a few times a week that contains banana, pineapple and spinach.  She admits that she loves rice and pasta.    I received a letter out of an organization in [facility] about patient wanting to enrolled in a study for people that may have NAFLD.  Patient states that she decided not to enroll anymore because when she showed up for the first visit they told her that she was supposed to come fasting for blood work.  On last visit she had complained of some pain below the left scapula pain that was going on for over a year.  She noticed that it was worse when she is actively moving around and better with sitting and lying down.  Since she last saw me she also noticed that it sometimes is there when she eats and now seems to be a little lower down towards the flank.  She denies any dysuria.  She has poor vision and is unable to tell if any blood is in the urine. On last visit I assessed that the pain was myofascial in nature and she was prescribed some Voltaren gel.  Patient states that the gel is very helpful.  Her aide applies it to the area twice a day.  In LT flank when she eats Gel helps.  Aid puts it  on Patient Active Problem List   Diagnosis Date Noted  . Legal blindness 07/25/2017  . Type 2 diabetes mellitus with retinopathy of both eyes, with long-term current use of insulin ([place]) 02/14/2016  . Essential hypertension 02/14/2016  . Onychomycosis due to dermatophyte 03/25/2013  . Hallux valgus, acquired 03/25/2013     Current Outpatient Medications on File Prior to Visit  Medication Sig Dispense Refill  . ACCU-CHEK SOFTCLIX LANCETS lancets Use as instructed 100 each 12  . atorvastatin (LIPITOR) 20 MG tablet Take 1 tablet (20 mg total) by mouth daily. 30 tablet 2  . Blood Glucose Monitoring Suppl (ACCU-CHEK AVIVA PLUS) w/Device KIT Use as directed 1 kit 0  . Calcium 600-400 MG-UNIT CHEW Chew 1 tablet by mouth daily. 60 tablet 2  . diclofenac sodium (VOLTAREN) 1 % GEL Apply 2 g topically 4 (four) times daily. 100 g 0  . ferrous sulfate 325 (65 FE) MG tablet Take 1 tablet (325 mg total) by mouth daily with breakfast. 100 tablet 3  . glucose blood (ACCU-CHEK AVIVA PLUS) test strip Use as instructed for 3 times daily testing of blood sugar. E11.9 100 each 12  . hydrOXYzine (ATARAX/VISTARIL) 10 MG tablet Take 1 tablet (10 mg total) by mouth 3 (three) times daily as needed for itching or anxiety. 30 tablet 0  .  Lancet Devices (ACCU-CHEK SOFTCLIX) lancets Use as instructed 1 each 0  . lisinopril (PRINIVIL,ZESTRIL) 30 MG tablet Take 1 tablet (30 mg total) by mouth daily. 90 tablet 3  . metFORMIN (GLUCOPHAGE) 1000 MG tablet Take 1 tablet (1,000 mg total) by mouth 2 (two) times daily with a meal. 180 tablet 3  . Multiple Vitamins-Minerals (MULTIVITAMIN WITH MINERALS) tablet Take 1 tablet by mouth daily.    Marland Kitchen terbinafine (LAMISIL) 250 MG tablet Take 250 mg by mouth daily.  0   No current facility-administered medications on file prior to visit.     No Known Allergies  Social History   Socioeconomic History  . Marital status: Single    Spouse name: Not on file  . Number of children: Not  on file  . Years of education: Not on file  . Highest education level: Not on file  Occupational History  . Not on file  Social Needs  . Financial resource strain: Not on file  . Food insecurity:    Worry: Not on file    Inability: Not on file  . Transportation needs:    Medical: Not on file    Non-medical: Not on file  Tobacco Use  . Smoking status: Never Smoker  . Smokeless tobacco: Never Used  Substance and Sexual Activity  . Alcohol use: Not on file  . Drug use: No  . Sexual activity: Not on file  Lifestyle  . Physical activity:    Days per week: Not on file    Minutes per session: Not on file  . Stress: Not on file  Relationships  . Social connections:    Talks on phone: Not on file    Gets together: Not on file    Attends religious service: Not on file    Active member of club or organization: Not on file    Attends meetings of clubs or organizations: Not on file    Relationship status: Not on file  . Intimate partner violence:    Fear of current or ex partner: Not on file    Emotionally abused: Not on file    Physically abused: Not on file    Forced sexual activity: Not on file  Other Topics Concern  . Not on file  Social History Narrative  . Not on file    Family History  Problem Relation Age of Onset  . Diabetes Mother   . Diabetes Father   . Hypertension Sister     Past Surgical History:  Procedure Laterality Date  . EYE SURGERY    . LASIK    . RETINAL DETACHMENT SURGERY      ROS: Review of Systems Negative except as above. PHYSICAL EXAM: BP (!) 149/82   Pulse 92   Temp 98.3 F (36.8 C) (Oral)   Resp 16   Wt 227 lb 9.6 oz (103.2 kg)   SpO2 100%   BMI 34.61 kg/m   Wt Readings from Last 3 Encounters:  02/20/18 227 lb 9.6 oz (103.2 kg)  01/17/18 229 lb 6.4 oz (104.1 kg)  07/25/17 218 lb 6.4 oz (99.1 kg)   Physical Exam  General appearance - alert, well appearing, and in no distress.  She ambulates using a stick for the  blind. Mental status - normal mood, behavior, speech, dress, motor activity, and thought processes Neck - supple, no significant adenopathy Chest - clear to auscultation, no wheezes, rales or rhonchi, symmetric air entry Heart - normal rate, regular rhythm, normal S1, S2, no  murmurs, rubs, clicks or gallops Musculoskeletal -no reproducible tenderness below the left scapula or in the left flank.   Extremities -no lower extremity edema.  Results for orders placed or performed in visit on 02/20/18  Urinalysis  Result Value Ref Range   Specific Gravity, UA 1.029 1.005 - 1.030   pH, UA 5.0 5.0 - 7.5   Color, UA Yellow Yellow   Appearance Ur Turbid (A) Clear   Leukocytes, UA Negative Negative   Protein, UA 1+ (A) Negative/Trace   Glucose, UA 2+ (A) Negative   Ketones, UA Negative Negative   RBC, UA Negative Negative   Bilirubin, UA Negative Negative   Urobilinogen, Ur 0.2 0.2 - 1.0 mg/dL   Nitrite, UA Negative Negative  POCT glucose (manual entry)  Result Value Ref Range   POC Glucose 273 (A) 70 - 99 mg/dl  POCT glycosylated hemoglobin (Hb A1C)  Result Value Ref Range   Hemoglobin A1C     HbA1c POC (<> result, manual entry)     HbA1c, POC (prediabetic range)     HbA1c, POC (controlled diabetic range) 7.9 (A) 0.0 - 7.0 %   A1C 7.9  ASSESSMENT AND PLAN: 1. Essential hypertension Not at goal.  Recommend increasing amlodipine to 10 mg daily.  Continue to limit salt in the foods. - amLODipine (NORVASC) 10 MG tablet; Take 1 tablet (10 mg total) by mouth daily.  Dispense: 30 tablet; Refill: 5  2. Type 2 diabetes mellitus, uncontrolled, with retinopathy ([place]) Continue metformin at current dose.  Change Amaryl to 4 mg in the a.m. and 2 mg in the p.m.  Continue to monitor blood sugars. - POCT glucose (manual entry) - POCT glycosylated hemoglobin (Hb A1C) - glimepiride (AMARYL) 4 MG tablet; 1 tab Po Q a.m and 1/2 tab PO Q p.m  Dispense: 135 tablet; Refill: 3  3. 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 Chronic left flank pain She will continue Voltaren gel.  We will check a UA today - Urinalysis  Patient was given the opportunity to ask questions.  Patient verbalized understanding of the plan and was able to repeat key elements of the plan.   Orders Placed This Encounter  Procedures  . Urinalysis  . POCT glucose (manual entry)  . POCT glycosylated hemoglobin (Hb A1C)     Requested Prescriptions   Signed Prescriptions Disp Refills  . glimepiride (AMARYL) 4 MG tablet 135 tablet 3    Sig: 1 tab Po Q a.m and 1/2 tab PO Q p.m  . amLODipine (NORVASC) 10 MG tablet 30 tablet 5    Sig: Take 1 tablet (10 mg total) by mouth daily.    Return in about 3 months (around 05/22/2018).  Karle Plumber, MD, FACP

## 2018-02-20 NOTE — Patient Instructions (Signed)
Your blood pressure is not well controlled.  Please increase amlodipine to 10 mg daily.  Your diabetes is not well controlled.  Try to cut back on the amount of white starches that you eat like rice and pasta.  Increase Amaryl to 4 mg in the morning and a half a tab in the evening.

## 2018-02-20 NOTE — Progress Notes (Signed)
[id number] a1c-7.9

## 2018-02-21 DIAGNOSIS — E1142 Type 2 diabetes mellitus with diabetic polyneuropathy: Secondary | ICD-10-CM | POA: Diagnosis not present

## 2018-02-21 LAB — URINALYSIS
Bilirubin, UA: NEGATIVE
KETONES UA: NEGATIVE
Leukocytes, UA: NEGATIVE
Nitrite, UA: NEGATIVE
RBC, UA: NEGATIVE
Specific Gravity, UA: 1.029 (ref 1.005–1.030)
UUROB: 0.2 mg/dL (ref 0.2–1.0)
pH, UA: 5 (ref 5.0–7.5)

## 2018-02-21 LAB — POCT GLYCOSYLATED HEMOGLOBIN (HGB A1C): HBA1C, POC (CONTROLLED DIABETIC RANGE): 7.9 % — AB (ref 0.0–7.0)

## 2018-02-21 LAB — GLUCOSE, POCT (MANUAL RESULT ENTRY): POC GLUCOSE: 273 mg/dL — AB (ref 70–99)

## 2018-02-24 DIAGNOSIS — E1142 Type 2 diabetes mellitus with diabetic polyneuropathy: Secondary | ICD-10-CM | POA: Diagnosis not present

## 2018-02-25 DIAGNOSIS — E1142 Type 2 diabetes mellitus with diabetic polyneuropathy: Secondary | ICD-10-CM | POA: Diagnosis not present

## 2018-02-26 DIAGNOSIS — E1142 Type 2 diabetes mellitus with diabetic polyneuropathy: Secondary | ICD-10-CM | POA: Diagnosis not present

## 2018-02-27 DIAGNOSIS — E1142 Type 2 diabetes mellitus with diabetic polyneuropathy: Secondary | ICD-10-CM | POA: Diagnosis not present

## 2018-02-28 DIAGNOSIS — E1142 Type 2 diabetes mellitus with diabetic polyneuropathy: Secondary | ICD-10-CM | POA: Diagnosis not present

## 2018-03-03 DIAGNOSIS — E1142 Type 2 diabetes mellitus with diabetic polyneuropathy: Secondary | ICD-10-CM | POA: Diagnosis not present

## 2018-03-04 DIAGNOSIS — E1142 Type 2 diabetes mellitus with diabetic polyneuropathy: Secondary | ICD-10-CM | POA: Diagnosis not present

## 2018-03-05 DIAGNOSIS — E1142 Type 2 diabetes mellitus with diabetic polyneuropathy: Secondary | ICD-10-CM | POA: Diagnosis not present

## 2018-03-06 DIAGNOSIS — E1142 Type 2 diabetes mellitus with diabetic polyneuropathy: Secondary | ICD-10-CM | POA: Diagnosis not present

## 2018-03-07 DIAGNOSIS — E1142 Type 2 diabetes mellitus with diabetic polyneuropathy: Secondary | ICD-10-CM | POA: Diagnosis not present

## 2018-03-10 DIAGNOSIS — E1142 Type 2 diabetes mellitus with diabetic polyneuropathy: Secondary | ICD-10-CM | POA: Diagnosis not present

## 2018-03-11 DIAGNOSIS — E1142 Type 2 diabetes mellitus with diabetic polyneuropathy: Secondary | ICD-10-CM | POA: Diagnosis not present

## 2018-03-12 DIAGNOSIS — E1142 Type 2 diabetes mellitus with diabetic polyneuropathy: Secondary | ICD-10-CM | POA: Diagnosis not present

## 2018-03-13 DIAGNOSIS — E1142 Type 2 diabetes mellitus with diabetic polyneuropathy: Secondary | ICD-10-CM | POA: Diagnosis not present

## 2018-03-14 DIAGNOSIS — E1142 Type 2 diabetes mellitus with diabetic polyneuropathy: Secondary | ICD-10-CM | POA: Diagnosis not present

## 2018-03-17 DIAGNOSIS — E1142 Type 2 diabetes mellitus with diabetic polyneuropathy: Secondary | ICD-10-CM | POA: Diagnosis not present

## 2018-03-18 DIAGNOSIS — E1142 Type 2 diabetes mellitus with diabetic polyneuropathy: Secondary | ICD-10-CM | POA: Diagnosis not present

## 2018-03-19 DIAGNOSIS — E1142 Type 2 diabetes mellitus with diabetic polyneuropathy: Secondary | ICD-10-CM | POA: Diagnosis not present

## 2018-03-20 DIAGNOSIS — E1142 Type 2 diabetes mellitus with diabetic polyneuropathy: Secondary | ICD-10-CM | POA: Diagnosis not present

## 2018-03-21 DIAGNOSIS — E1142 Type 2 diabetes mellitus with diabetic polyneuropathy: Secondary | ICD-10-CM | POA: Diagnosis not present

## 2018-03-24 DIAGNOSIS — E1142 Type 2 diabetes mellitus with diabetic polyneuropathy: Secondary | ICD-10-CM | POA: Diagnosis not present

## 2018-03-25 DIAGNOSIS — E1142 Type 2 diabetes mellitus with diabetic polyneuropathy: Secondary | ICD-10-CM | POA: Diagnosis not present

## 2018-03-26 DIAGNOSIS — E1142 Type 2 diabetes mellitus with diabetic polyneuropathy: Secondary | ICD-10-CM | POA: Diagnosis not present

## 2018-03-27 DIAGNOSIS — E1142 Type 2 diabetes mellitus with diabetic polyneuropathy: Secondary | ICD-10-CM | POA: Diagnosis not present

## 2018-03-28 DIAGNOSIS — E1142 Type 2 diabetes mellitus with diabetic polyneuropathy: Secondary | ICD-10-CM | POA: Diagnosis not present

## 2018-03-31 DIAGNOSIS — E1142 Type 2 diabetes mellitus with diabetic polyneuropathy: Secondary | ICD-10-CM | POA: Diagnosis not present

## 2018-04-01 DIAGNOSIS — E1142 Type 2 diabetes mellitus with diabetic polyneuropathy: Secondary | ICD-10-CM | POA: Diagnosis not present

## 2018-04-02 DIAGNOSIS — E1142 Type 2 diabetes mellitus with diabetic polyneuropathy: Secondary | ICD-10-CM | POA: Diagnosis not present

## 2018-04-03 DIAGNOSIS — E1142 Type 2 diabetes mellitus with diabetic polyneuropathy: Secondary | ICD-10-CM | POA: Diagnosis not present

## 2018-04-04 DIAGNOSIS — E1142 Type 2 diabetes mellitus with diabetic polyneuropathy: Secondary | ICD-10-CM | POA: Diagnosis not present

## 2018-04-07 DIAGNOSIS — E1142 Type 2 diabetes mellitus with diabetic polyneuropathy: Secondary | ICD-10-CM | POA: Diagnosis not present

## 2018-04-08 ENCOUNTER — Telehealth: Payer: Self-pay | Admitting: Internal Medicine

## 2018-04-08 DIAGNOSIS — E1142 Type 2 diabetes mellitus with diabetic polyneuropathy: Secondary | ICD-10-CM | POA: Diagnosis not present

## 2018-04-08 NOTE — Telephone Encounter (Signed)
1) Medication(s) Requested (by name): Glimepiride  2) Pharmacy of Choice: [facility] [address]     Patient would like it transformed to [facility] and was informed of process however patient felt she did not have to do it because it was not her mistake   Please follow up

## 2018-04-09 DIAGNOSIS — E1142 Type 2 diabetes mellitus with diabetic polyneuropathy: Secondary | ICD-10-CM | POA: Diagnosis not present

## 2018-04-10 DIAGNOSIS — E1142 Type 2 diabetes mellitus with diabetic polyneuropathy: Secondary | ICD-10-CM | POA: Diagnosis not present

## 2018-04-10 NOTE — Telephone Encounter (Signed)
RX was [facility] to [facility] on 04/08/18, no further action required.

## 2018-04-11 ENCOUNTER — Other Ambulatory Visit: Payer: Self-pay | Admitting: Pharmacist

## 2018-04-11 DIAGNOSIS — E11311 Type 2 diabetes mellitus with unspecified diabetic retinopathy with macular edema: Secondary | ICD-10-CM

## 2018-04-11 DIAGNOSIS — E1142 Type 2 diabetes mellitus with diabetic polyneuropathy: Secondary | ICD-10-CM | POA: Diagnosis not present

## 2018-04-11 MED ORDER — METFORMIN HCL 1000 MG PO TABS: 1000.0000 mg | ORAL_TABLET | Freq: Two times a day (BID) | ORAL | 2 refills | Status: DC

## 2018-04-14 DIAGNOSIS — E1142 Type 2 diabetes mellitus with diabetic polyneuropathy: Secondary | ICD-10-CM | POA: Diagnosis not present

## 2018-04-15 DIAGNOSIS — E1142 Type 2 diabetes mellitus with diabetic polyneuropathy: Secondary | ICD-10-CM | POA: Diagnosis not present

## 2018-04-16 DIAGNOSIS — E1142 Type 2 diabetes mellitus with diabetic polyneuropathy: Secondary | ICD-10-CM | POA: Diagnosis not present

## 2018-04-17 DIAGNOSIS — E1142 Type 2 diabetes mellitus with diabetic polyneuropathy: Secondary | ICD-10-CM | POA: Diagnosis not present

## 2018-04-18 DIAGNOSIS — E1142 Type 2 diabetes mellitus with diabetic polyneuropathy: Secondary | ICD-10-CM | POA: Diagnosis not present

## 2018-04-21 DIAGNOSIS — E1142 Type 2 diabetes mellitus with diabetic polyneuropathy: Secondary | ICD-10-CM | POA: Diagnosis not present

## 2018-04-22 DIAGNOSIS — E1142 Type 2 diabetes mellitus with diabetic polyneuropathy: Secondary | ICD-10-CM | POA: Diagnosis not present

## 2018-04-23 DIAGNOSIS — E1142 Type 2 diabetes mellitus with diabetic polyneuropathy: Secondary | ICD-10-CM | POA: Diagnosis not present

## 2018-04-24 DIAGNOSIS — E1142 Type 2 diabetes mellitus with diabetic polyneuropathy: Secondary | ICD-10-CM | POA: Diagnosis not present

## 2018-04-25 DIAGNOSIS — E1142 Type 2 diabetes mellitus with diabetic polyneuropathy: Secondary | ICD-10-CM | POA: Diagnosis not present

## 2018-04-28 DIAGNOSIS — E1142 Type 2 diabetes mellitus with diabetic polyneuropathy: Secondary | ICD-10-CM | POA: Diagnosis not present

## 2018-04-29 DIAGNOSIS — E1142 Type 2 diabetes mellitus with diabetic polyneuropathy: Secondary | ICD-10-CM | POA: Diagnosis not present

## 2018-04-30 DIAGNOSIS — E1142 Type 2 diabetes mellitus with diabetic polyneuropathy: Secondary | ICD-10-CM | POA: Diagnosis not present

## 2018-05-01 DIAGNOSIS — E1142 Type 2 diabetes mellitus with diabetic polyneuropathy: Secondary | ICD-10-CM | POA: Diagnosis not present

## 2018-05-02 DIAGNOSIS — E1142 Type 2 diabetes mellitus with diabetic polyneuropathy: Secondary | ICD-10-CM | POA: Diagnosis not present

## 2018-05-05 DIAGNOSIS — E1142 Type 2 diabetes mellitus with diabetic polyneuropathy: Secondary | ICD-10-CM | POA: Diagnosis not present

## 2018-05-06 DIAGNOSIS — E1142 Type 2 diabetes mellitus with diabetic polyneuropathy: Secondary | ICD-10-CM | POA: Diagnosis not present

## 2018-05-07 DIAGNOSIS — E1142 Type 2 diabetes mellitus with diabetic polyneuropathy: Secondary | ICD-10-CM | POA: Diagnosis not present

## 2018-05-08 DIAGNOSIS — E1142 Type 2 diabetes mellitus with diabetic polyneuropathy: Secondary | ICD-10-CM | POA: Diagnosis not present

## 2018-05-09 DIAGNOSIS — E1142 Type 2 diabetes mellitus with diabetic polyneuropathy: Secondary | ICD-10-CM | POA: Diagnosis not present

## 2018-05-12 DIAGNOSIS — E1142 Type 2 diabetes mellitus with diabetic polyneuropathy: Secondary | ICD-10-CM | POA: Diagnosis not present

## 2018-05-13 DIAGNOSIS — E1142 Type 2 diabetes mellitus with diabetic polyneuropathy: Secondary | ICD-10-CM | POA: Diagnosis not present

## 2018-05-14 DIAGNOSIS — E1142 Type 2 diabetes mellitus with diabetic polyneuropathy: Secondary | ICD-10-CM | POA: Diagnosis not present

## 2018-05-15 DIAGNOSIS — E1142 Type 2 diabetes mellitus with diabetic polyneuropathy: Secondary | ICD-10-CM | POA: Diagnosis not present

## 2018-05-16 DIAGNOSIS — E1142 Type 2 diabetes mellitus with diabetic polyneuropathy: Secondary | ICD-10-CM | POA: Diagnosis not present

## 2018-05-19 DIAGNOSIS — E1142 Type 2 diabetes mellitus with diabetic polyneuropathy: Secondary | ICD-10-CM | POA: Diagnosis not present

## 2018-05-20 DIAGNOSIS — E1142 Type 2 diabetes mellitus with diabetic polyneuropathy: Secondary | ICD-10-CM | POA: Diagnosis not present

## 2018-05-21 DIAGNOSIS — E1142 Type 2 diabetes mellitus with diabetic polyneuropathy: Secondary | ICD-10-CM | POA: Diagnosis not present

## 2018-05-22 ENCOUNTER — Ambulatory Visit: Payer: Medicaid Other | Attending: Internal Medicine | Admitting: Internal Medicine

## 2018-05-22 ENCOUNTER — Encounter: Payer: Self-pay | Admitting: Internal Medicine

## 2018-05-22 VITALS — BP 151/87 | HR 80 | Temp 98.1°F | Resp 16 | Wt 232.6 lb

## 2018-05-22 DIAGNOSIS — Z791 Long term (current) use of non-steroidal anti-inflammatories (NSAID): Secondary | ICD-10-CM | POA: Insufficient documentation

## 2018-05-22 DIAGNOSIS — E118 Type 2 diabetes mellitus with unspecified complications: Secondary | ICD-10-CM

## 2018-05-22 DIAGNOSIS — H548 Legal blindness, as defined in USA: Secondary | ICD-10-CM | POA: Insufficient documentation

## 2018-05-22 DIAGNOSIS — Z833 Family history of diabetes mellitus: Secondary | ICD-10-CM | POA: Diagnosis not present

## 2018-05-22 DIAGNOSIS — E1165 Type 2 diabetes mellitus with hyperglycemia: Secondary | ICD-10-CM | POA: Diagnosis not present

## 2018-05-22 DIAGNOSIS — I10 Essential (primary) hypertension: Secondary | ICD-10-CM

## 2018-05-22 DIAGNOSIS — E1142 Type 2 diabetes mellitus with diabetic polyneuropathy: Secondary | ICD-10-CM | POA: Diagnosis not present

## 2018-05-22 DIAGNOSIS — IMO0002 Reserved for concepts with insufficient information to code with codable children: Secondary | ICD-10-CM

## 2018-05-22 DIAGNOSIS — Z79899 Other long term (current) drug therapy: Secondary | ICD-10-CM | POA: Diagnosis not present

## 2018-05-22 DIAGNOSIS — Z8249 Family history of ischemic heart disease and other diseases of the circulatory system: Secondary | ICD-10-CM | POA: Diagnosis not present

## 2018-05-22 DIAGNOSIS — E11311 Type 2 diabetes mellitus with unspecified diabetic retinopathy with macular edema: Secondary | ICD-10-CM | POA: Diagnosis not present

## 2018-05-22 DIAGNOSIS — R600 Localized edema: Secondary | ICD-10-CM | POA: Diagnosis not present

## 2018-05-22 DIAGNOSIS — E11319 Type 2 diabetes mellitus with unspecified diabetic retinopathy without macular edema: Secondary | ICD-10-CM | POA: Diagnosis not present

## 2018-05-22 DIAGNOSIS — E1140 Type 2 diabetes mellitus with diabetic neuropathy, unspecified: Secondary | ICD-10-CM | POA: Insufficient documentation

## 2018-05-22 DIAGNOSIS — Z7984 Long term (current) use of oral hypoglycemic drugs: Secondary | ICD-10-CM | POA: Insufficient documentation

## 2018-05-22 DIAGNOSIS — E113541 Type 2 diabetes mellitus with proliferative diabetic retinopathy with combined traction retinal detachment and rhegmatogenous retinal detachment, right eye: Secondary | ICD-10-CM | POA: Diagnosis not present

## 2018-05-22 LAB — POCT GLYCOSYLATED HEMOGLOBIN (HGB A1C): HbA1c, POC (controlled diabetic range): 8.7 % — AB (ref 0.0–7.0)

## 2018-05-22 LAB — GLUCOSE, POCT (MANUAL RESULT ENTRY): POC Glucose: 131 mg/dl — AB (ref 70–99)

## 2018-05-22 MED ORDER — GLIMEPIRIDE 4 MG PO TABS
4.0000 mg | ORAL_TABLET | Freq: Two times a day (BID) | ORAL | 6 refills | Status: DC
Start: 2018-05-22 — End: 2019-05-21

## 2018-05-22 MED ORDER — HYDROCHLOROTHIAZIDE 25 MG PO TABS: 25.0000 mg | ORAL_TABLET | Freq: Every day | ORAL | 6 refills | Status: DC

## 2018-05-22 NOTE — Progress Notes (Signed)
Patient ID: Narissa Beaufort, female    DOB: Jun 03, 1960  MRN: [id number]  CC: Diabetes and Hypertension   Subjective: Ginny Loomer is a 58 y.o. female who presents for chronic ds management Her concerns today include:  Patient with history ofDMtype II with associated peripheral neuropathy and retinopathy,legally blind in both eyes, HTN, HL,  HTN: Norvasc inc on last visit to 10 mg.  However, pt never got it because it was sent to the wrong pharmacy.  Therefore she has continued to take the 5 mg along with lisinopril.  No device to check Limits salt in foods Denies any chest pains or shortness of breath.  DM: A1C up almost 1 point since last visit.  Not sure why A1C higher as she has been eating less Checks BS TID. Forgot to bring meter with her.  BS higher in mornings usually in 200s, 130-140s in the afernoon.  She has not had any hypoglycemic episodes.  Snacks on Almonds at nights.  She stop drinking juices Had membership about 1 yr ago at [employer].  Plans to join again.  She feels she needs to move more. Last eye exam 09/2017 Patient Active Problem List   Diagnosis Date Noted  . 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 Legal blindness 07/25/2017  . Type 2 diabetes mellitus with retinopathy of both eyes, with long-term current use of insulin ([place]) 02/14/2016  . Essential hypertension 02/14/2016  . Onychomycosis due to dermatophyte 03/25/2013  . Hallux valgus, acquired 03/25/2013     Current Outpatient Medications on File Prior to Visit  Medication Sig Dispense Refill  . ACCU-CHEK SOFTCLIX LANCETS lancets Use as instructed 100 each 12  . atorvastatin (LIPITOR) 20 MG tablet Take 1 tablet (20 mg total) by mouth daily. 30 tablet 2  . Blood Glucose Monitoring Suppl (ACCU-CHEK AVIVA PLUS) w/Device KIT Use as directed 1 kit 0  . Calcium 600-400 MG-UNIT  CHEW Chew 1 tablet by mouth daily. 60 tablet 2  . diclofenac sodium (VOLTAREN) 1 % GEL Apply 2 g topically 4 (four) times daily. 100 g 0  . ferrous sulfate 325 (65 FE) MG tablet Take 1 tablet (325 mg total) by mouth daily with breakfast. 100 tablet 3  . glucose blood (ACCU-CHEK AVIVA PLUS) test strip Use as instructed for 3 times daily testing of blood sugar. E11.9 100 each 12  . hydrOXYzine (ATARAX/VISTARIL) 10 MG tablet Take 1 tablet (10 mg total) by mouth 3 (three) times daily as needed for itching or anxiety. 30 tablet 0  . Lancet Devices (ACCU-CHEK SOFTCLIX) lancets Use as instructed 1 each 0  . lisinopril (PRINIVIL,ZESTRIL) 30 MG tablet Take 1 tablet (30 mg total) by mouth daily. 90 tablet 3  . metFORMIN (GLUCOPHAGE) 1000 MG tablet Take 1 tablet (1,000 mg total) by mouth 2 (two) times daily with a meal. 60 tablet 2  . Multiple Vitamins-Minerals (MULTIVITAMIN WITH MINERALS) tablet Take 1 tablet by mouth daily.    Marland Kitchen terbinafine (LAMISIL) 250 MG tablet Take 250 mg by mouth daily.  0   No current facility-administered medications on file prior to visit.     No Known Allergies  Social History   Socioeconomic History  . Marital status: Single    Spouse name: Not on file  . Number of children: Not on file  . Years of education: Not on file  . Highest education level: Not on file  Occupational History  . Not on file  Social Needs  . Financial resource strain: Not on file  . Food insecurity:    Worry: Not on file    Inability: Not on file  . Transportation needs:    Medical: Not on file    Non-medical: Not on file  Tobacco Use  . Smoking status: Never Smoker  . Smokeless tobacco: Never Used  Substance and Sexual Activity  . Alcohol use: Not on file  . Drug use: No  . Sexual activity: Not on file  Lifestyle  . Physical activity:    Days per week: Not on file    Minutes per session: Not on file  . Stress: Not on file  Relationships  . Social connections:    Talks on phone:  Not on file    Gets together: Not on file    Attends religious service: Not on file    Active member of club or organization: Not on file    Attends meetings of clubs or organizations: Not on file    Relationship status: Not on file  . Intimate partner violence:    Fear of current or ex partner: Not on file    Emotionally abused: Not on file    Physically abused: Not on file    Forced sexual activity: Not on file  Other Topics Concern  . Not on file  Social History Narrative  . Not on file    Family History  Problem Relation Age of Onset  . Diabetes Mother   . Diabetes Father   . Hypertension Sister     Past Surgical History:  Procedure Laterality Date  . EYE SURGERY    . LASIK    . RETINAL DETACHMENT SURGERY      ROS: Review of Systems Negative except as stated above  PHYSICAL EXAM: BP (!) 151/87   Pulse 80   Temp 98.1 F (36.7 C) (Oral)   Resp 16   Wt 232 lb 9.6 oz (105.5 kg)   SpO2 100%   BMI 35.37 kg/m   Physical Exam  General appearance - alert, well appearing, and in no distress Mental status - normal mood, behavior, speech, dress, motor activity, and thought processes Mouth - mucous membranes moist, pharynx normal without lesions Neck - supple, no significant adenopathy Chest - clear to auscultation, no wheezes, rales or rhonchi, symmetric air entry Heart - normal rate, regular rhythm, normal S1, S2, no murmurs, rubs, clicks or gallops Extremities - 1+ LE edema  Results for orders placed or performed in visit on 05/22/18  POCT glucose (manual entry)  Result Value Ref Range   POC Glucose 131 (A) 70 - 99 mg/dl  POCT glycosylated hemoglobin (Hb A1C)  Result Value Ref Range   Hemoglobin A1C     HbA1c POC (<> result, manual entry)     HbA1c, POC (prediabetic range)     HbA1c, POC (controlled diabetic range) 8.7 (A) 0.0 - 7.0 %    CMP Latest Ref Rng & Units 07/25/2017 05/14/2016 02/13/2016  Glucose 65 - 99 mg/dL 112(H) - 180(H)  BUN 6 - 24 mg/dL 14 -  19  Creatinine 0.57 - 1.00 mg/dL 0.72 - 0.93  Sodium 134 - 144 mmol/L 140 - 137  Potassium 3.5 - 5.2 mmol/L 4.1 - 3.9  Chloride 96 - 106 mmol/L 101 - 100  CO2 20 - 29 mmol/L 26 - 25  Calcium 8.7 - 10.2 mg/dL 9.3 - 9.0  Total Protein 6.0 - 8.5   g/dL 7.3 7.7 -  Total Bilirubin 0.0 - 1.2 mg/dL <0.2 0.3 -  Alkaline Phos 39 - 117 IU/L 76 61 -  AST 0 - 40 IU/L 15 17 -  ALT 0 - 32 IU/L 14 13 -   Lipid Panel     Component Value Date/Time   CHOL 175 07/25/2017 1629   TRIG 309 (H) 07/25/2017 1629   HDL 36 (L) 07/25/2017 1629   CHOLHDL 4.9 (H) 07/25/2017 1629   CHOLHDL 4.7 05/14/2016 1607   VLDL 44 (H) 05/14/2016 1607   LDLCALC 77 07/25/2017 1629    CBC    Component Value Date/Time   WBC 5.8 07/25/2017 1629   WBC SEE NOTE 02/13/2016 1746   RBC 4.06 07/25/2017 1629   RBC CANCELED 02/13/2016 1746   HGB 10.3 (L) 07/25/2017 1629   HCT 33.4 (L) 07/25/2017 1629   PLT 339 07/25/2017 1629   MCV 82 07/25/2017 1629   MCH 25.4 (L) 07/25/2017 1629   MCH CANCELED 02/13/2016 1746   MCHC 30.8 (L) 07/25/2017 1629   MCHC CANCELED 02/13/2016 1746   RDW 14.0 07/25/2017 1629   LYMPHSABS CANCELED 02/13/2016 1746   MONOABS CANCELED 02/13/2016 1746   EOSABS CANCELED 02/13/2016 1746   BASOSABS CANCELED 02/13/2016 1746    ASSESSMENT AND PLAN: 1. Uncontrolled type 2 diabetes mellitus with complication, without long-term current use of insulin ([facility]) We discussed adding another medication versus increasing the glimepiride to 4 mg twice a day.  Patient opted for the latter.  We discussed healthy eating habits.  Patient plans to join a gym close to her house and start going a few times a week - POCT glucose (manual entry) - POCT glycosylated hemoglobin (Hb A1C) - glimepiride (AMARYL) 4 MG tablet; Take 1 tablet (4 mg total) by mouth 2 (two) times daily.  Dispense: 60 tablet; Refill: 6 - CBC; Future - Comprehensive metabolic panel; Future - Lipid panel; Future  2. Right eye affected by proliferative  diabetic retinopathy with combined traction and rhegmatogenous retinal detachment, associated with type 2 diabetes mellitus ([place]) Followed by ophthalmology  3. Essential hypertension Not at goal.  She has some lower extremity edema so I recommend stopping the amlodipine and starting hydrochlorothiazide instead.  Continue lisinopril.  Advised that she returns to the lab in about 1 to 2 weeks for blood test including a chemistry - hydrochlorothiazide (HYDRODIURIL) 25 MG tablet; Take 1 tablet (25 mg total) by mouth daily.  Dispense: 30 tablet; Refill: 6  We did not get to address some of her health maintenance today as patient had to leave when the scat bus came for her  Patient was given the opportunity to ask questions.  Patient verbalized understanding of the plan and was able to repeat key elements of the plan.   Orders Placed This Encounter  Procedures  . CBC  . Comprehensive metabolic panel  . Lipid panel  . POCT glucose (manual entry)  . POCT glycosylated hemoglobin (Hb A1C)     Requested Prescriptions   Signed Prescriptions Disp Refills  . glimepiride (AMARYL) 4 MG tablet 60 tablet 6    Sig: Take 1 tablet (4 mg total) by mouth 2 (two) times daily.  . hydrochlorothiazide (HYDRODIURIL) 25 MG tablet 30 tablet 6    Sig: Take 1 tablet (25 mg total) by mouth daily.    Return in about 3 months (around 08/22/2018).  Karle Plumber, MD, FACP

## 2018-05-22 NOTE — Patient Instructions (Signed)
Your blood pressure is not at goal.  Stop amlodipine.  Start hydrochlorothiazide 25 mg once a day.  Please return to the lab in about 1 to 2 weeks for blood test as discussed today.  We have increased the glimepiride to 4 mg twice a day.  Continue to monitor your blood sugars.

## 2018-05-23 DIAGNOSIS — E1142 Type 2 diabetes mellitus with diabetic polyneuropathy: Secondary | ICD-10-CM | POA: Diagnosis not present

## 2018-05-26 ENCOUNTER — Telehealth: Payer: Self-pay | Admitting: Internal Medicine

## 2018-05-26 DIAGNOSIS — E1142 Type 2 diabetes mellitus with diabetic polyneuropathy: Secondary | ICD-10-CM | POA: Diagnosis not present

## 2018-05-26 DIAGNOSIS — E118 Type 2 diabetes mellitus with unspecified complications: Secondary | ICD-10-CM

## 2018-05-26 NOTE — Telephone Encounter (Signed)
New Message   Pt states she has a lab appt on 3/12 but she has not started taking her blood pressure medication yet. So she wants to know if she should still come in

## 2018-05-26 NOTE — Telephone Encounter (Signed)
She will need to start medication first. Please contact pt and inform her that once she starts medication to give [place] a call to schedule her an lab appointment for 1 to 2 weeks

## 2018-05-27 DIAGNOSIS — E1142 Type 2 diabetes mellitus with diabetic polyneuropathy: Secondary | ICD-10-CM | POA: Diagnosis not present

## 2018-05-28 DIAGNOSIS — E1142 Type 2 diabetes mellitus with diabetic polyneuropathy: Secondary | ICD-10-CM | POA: Diagnosis not present

## 2018-05-29 ENCOUNTER — Other Ambulatory Visit: Payer: Medicaid Other

## 2018-05-29 DIAGNOSIS — E1142 Type 2 diabetes mellitus with diabetic polyneuropathy: Secondary | ICD-10-CM | POA: Diagnosis not present

## 2018-05-30 DIAGNOSIS — E1142 Type 2 diabetes mellitus with diabetic polyneuropathy: Secondary | ICD-10-CM | POA: Diagnosis not present

## 2018-06-02 DIAGNOSIS — E1142 Type 2 diabetes mellitus with diabetic polyneuropathy: Secondary | ICD-10-CM | POA: Diagnosis not present

## 2018-06-03 ENCOUNTER — Ambulatory Visit: Payer: Medicaid Other | Attending: Internal Medicine

## 2018-06-03 ENCOUNTER — Other Ambulatory Visit: Payer: Self-pay

## 2018-06-03 DIAGNOSIS — E118 Type 2 diabetes mellitus with unspecified complications: Secondary | ICD-10-CM | POA: Diagnosis not present

## 2018-06-03 DIAGNOSIS — E1142 Type 2 diabetes mellitus with diabetic polyneuropathy: Secondary | ICD-10-CM | POA: Diagnosis not present

## 2018-06-03 DIAGNOSIS — IMO0002 Reserved for concepts with insufficient information to code with codable children: Secondary | ICD-10-CM

## 2018-06-03 DIAGNOSIS — E1165 Type 2 diabetes mellitus with hyperglycemia: Secondary | ICD-10-CM | POA: Diagnosis not present

## 2018-06-04 DIAGNOSIS — E1142 Type 2 diabetes mellitus with diabetic polyneuropathy: Secondary | ICD-10-CM | POA: Diagnosis not present

## 2018-06-04 LAB — COMPREHENSIVE METABOLIC PANEL
ALBUMIN: 4 g/dL (ref 3.8–4.9)
ALK PHOS: 70 IU/L (ref 39–117)
ALT: 17 IU/L (ref 0–32)
AST: 20 IU/L (ref 0–40)
Albumin/Globulin Ratio: 1.5 (ref 1.2–2.2)
BILIRUBIN TOTAL: 0.3 mg/dL (ref 0.0–1.2)
BUN / CREAT RATIO: 26 — AB (ref 9–23)
BUN: 23 mg/dL (ref 6–24)
CHLORIDE: 99 mmol/L (ref 96–106)
CO2: 21 mmol/L (ref 20–29)
CREATININE: 0.88 mg/dL (ref 0.57–1.00)
Calcium: 9 mg/dL (ref 8.7–10.2)
GFR calc Af Amer: 83 mL/min/{1.73_m2} (ref >59)
GFR calc non Af Amer: 72 mL/min/{1.73_m2} (ref >59)
Globulin, Total: 2.7 g/dL (ref 1.5–4.5)
Glucose: 202 mg/dL — ABNORMAL HIGH (ref 65–99)
POTASSIUM: 4.4 mmol/L (ref 3.5–5.2)
SODIUM: 136 mmol/L (ref 134–144)
Total Protein: 6.7 g/dL (ref 6.0–8.5)

## 2018-06-04 LAB — LIPID PANEL
Chol/HDL Ratio: 4.4 ratio (ref 0.0–4.4)
Cholesterol, Total: 180 mg/dL (ref 100–199)
HDL: 41 mg/dL (ref >39)
LDL CALC: 104 mg/dL — AB (ref 0–99)
Triglycerides: 175 mg/dL — ABNORMAL HIGH (ref 0–149)
VLDL CHOLESTEROL CAL: 35 mg/dL (ref 5–40)

## 2018-06-04 LAB — CBC
HEMATOCRIT: 33.8 % — AB (ref 34.0–46.6)
HEMOGLOBIN: 10.5 g/dL — AB (ref 11.1–15.9)
MCH: 24.4 pg — AB (ref 26.6–33.0)
MCHC: 31.1 g/dL — ABNORMAL LOW (ref 31.5–35.7)
MCV: 79 fL (ref 79–97)
NRBC: 1 % — ABNORMAL HIGH (ref 0–0)
Platelets: 285 10*3/uL (ref 150–450)
RBC: 4.3 x10E6/uL (ref 3.77–5.28)
RDW: 12.7 % (ref 11.7–15.4)
WBC: 6 10*3/uL (ref 3.4–10.8)

## 2018-06-05 ENCOUNTER — Telehealth: Payer: Self-pay

## 2018-06-05 DIAGNOSIS — E1142 Type 2 diabetes mellitus with diabetic polyneuropathy: Secondary | ICD-10-CM | POA: Diagnosis not present

## 2018-06-05 NOTE — Telephone Encounter (Signed)
Contacted pt to go over lab results pt didn't answer lvm asking pt to give me a call at her earliest convenience  

## 2018-06-06 DIAGNOSIS — E1142 Type 2 diabetes mellitus with diabetic polyneuropathy: Secondary | ICD-10-CM | POA: Diagnosis not present

## 2018-06-06 MED ORDER — ACCU-CHEK SOFTCLIX LANCETS MISC: 12 refills | Status: DC

## 2018-06-06 NOTE — Telephone Encounter (Signed)
Provided pt with results.   Pt states she hasn't been taking the liptor.  Pt would like rx sent to [facility] on [address]

## 2018-06-06 NOTE — Telephone Encounter (Signed)
Patient called for labs  °

## 2018-06-09 DIAGNOSIS — E1142 Type 2 diabetes mellitus with diabetic polyneuropathy: Secondary | ICD-10-CM | POA: Diagnosis not present

## 2018-06-10 DIAGNOSIS — E1142 Type 2 diabetes mellitus with diabetic polyneuropathy: Secondary | ICD-10-CM | POA: Diagnosis not present

## 2018-06-11 DIAGNOSIS — E1142 Type 2 diabetes mellitus with diabetic polyneuropathy: Secondary | ICD-10-CM | POA: Diagnosis not present

## 2018-06-12 DIAGNOSIS — E1142 Type 2 diabetes mellitus with diabetic polyneuropathy: Secondary | ICD-10-CM | POA: Diagnosis not present

## 2018-06-13 DIAGNOSIS — E1142 Type 2 diabetes mellitus with diabetic polyneuropathy: Secondary | ICD-10-CM | POA: Diagnosis not present

## 2018-06-16 DIAGNOSIS — E1142 Type 2 diabetes mellitus with diabetic polyneuropathy: Secondary | ICD-10-CM | POA: Diagnosis not present

## 2018-06-17 DIAGNOSIS — E1142 Type 2 diabetes mellitus with diabetic polyneuropathy: Secondary | ICD-10-CM | POA: Diagnosis not present

## 2018-06-18 DIAGNOSIS — E1142 Type 2 diabetes mellitus with diabetic polyneuropathy: Secondary | ICD-10-CM | POA: Diagnosis not present

## 2018-06-19 DIAGNOSIS — E1142 Type 2 diabetes mellitus with diabetic polyneuropathy: Secondary | ICD-10-CM | POA: Diagnosis not present

## 2018-06-20 DIAGNOSIS — E1142 Type 2 diabetes mellitus with diabetic polyneuropathy: Secondary | ICD-10-CM | POA: Diagnosis not present

## 2018-06-23 DIAGNOSIS — E1142 Type 2 diabetes mellitus with diabetic polyneuropathy: Secondary | ICD-10-CM | POA: Diagnosis not present

## 2018-06-24 DIAGNOSIS — E1142 Type 2 diabetes mellitus with diabetic polyneuropathy: Secondary | ICD-10-CM | POA: Diagnosis not present

## 2018-06-25 DIAGNOSIS — E1142 Type 2 diabetes mellitus with diabetic polyneuropathy: Secondary | ICD-10-CM | POA: Diagnosis not present

## 2018-06-26 DIAGNOSIS — E1142 Type 2 diabetes mellitus with diabetic polyneuropathy: Secondary | ICD-10-CM | POA: Diagnosis not present

## 2018-06-27 DIAGNOSIS — E1142 Type 2 diabetes mellitus with diabetic polyneuropathy: Secondary | ICD-10-CM | POA: Diagnosis not present

## 2018-06-30 DIAGNOSIS — E1142 Type 2 diabetes mellitus with diabetic polyneuropathy: Secondary | ICD-10-CM | POA: Diagnosis not present

## 2018-07-01 DIAGNOSIS — E1142 Type 2 diabetes mellitus with diabetic polyneuropathy: Secondary | ICD-10-CM | POA: Diagnosis not present

## 2018-07-02 DIAGNOSIS — E1142 Type 2 diabetes mellitus with diabetic polyneuropathy: Secondary | ICD-10-CM | POA: Diagnosis not present

## 2018-07-03 DIAGNOSIS — E1142 Type 2 diabetes mellitus with diabetic polyneuropathy: Secondary | ICD-10-CM | POA: Diagnosis not present

## 2018-07-04 DIAGNOSIS — E1142 Type 2 diabetes mellitus with diabetic polyneuropathy: Secondary | ICD-10-CM | POA: Diagnosis not present

## 2018-07-07 DIAGNOSIS — E1142 Type 2 diabetes mellitus with diabetic polyneuropathy: Secondary | ICD-10-CM | POA: Diagnosis not present

## 2018-07-08 DIAGNOSIS — E1142 Type 2 diabetes mellitus with diabetic polyneuropathy: Secondary | ICD-10-CM | POA: Diagnosis not present

## 2018-07-09 DIAGNOSIS — E1142 Type 2 diabetes mellitus with diabetic polyneuropathy: Secondary | ICD-10-CM | POA: Diagnosis not present

## 2018-07-10 DIAGNOSIS — E1142 Type 2 diabetes mellitus with diabetic polyneuropathy: Secondary | ICD-10-CM | POA: Diagnosis not present

## 2018-07-11 DIAGNOSIS — E1142 Type 2 diabetes mellitus with diabetic polyneuropathy: Secondary | ICD-10-CM | POA: Diagnosis not present

## 2018-07-14 DIAGNOSIS — E1142 Type 2 diabetes mellitus with diabetic polyneuropathy: Secondary | ICD-10-CM | POA: Diagnosis not present

## 2018-07-15 DIAGNOSIS — E1142 Type 2 diabetes mellitus with diabetic polyneuropathy: Secondary | ICD-10-CM | POA: Diagnosis not present

## 2018-07-16 ENCOUNTER — Other Ambulatory Visit: Payer: Self-pay | Admitting: Internal Medicine

## 2018-07-16 DIAGNOSIS — E1142 Type 2 diabetes mellitus with diabetic polyneuropathy: Secondary | ICD-10-CM | POA: Diagnosis not present

## 2018-07-16 DIAGNOSIS — I10 Essential (primary) hypertension: Secondary | ICD-10-CM

## 2018-07-17 DIAGNOSIS — E1142 Type 2 diabetes mellitus with diabetic polyneuropathy: Secondary | ICD-10-CM | POA: Diagnosis not present

## 2018-07-18 DIAGNOSIS — E1142 Type 2 diabetes mellitus with diabetic polyneuropathy: Secondary | ICD-10-CM | POA: Diagnosis not present

## 2018-07-21 ENCOUNTER — Telehealth: Payer: Self-pay | Admitting: Internal Medicine

## 2018-07-21 DIAGNOSIS — E782 Mixed hyperlipidemia: Secondary | ICD-10-CM

## 2018-07-21 DIAGNOSIS — E1142 Type 2 diabetes mellitus with diabetic polyneuropathy: Secondary | ICD-10-CM | POA: Diagnosis not present

## 2018-07-21 MED ORDER — ATORVASTATIN CALCIUM 20 MG PO TABS: 20.0000 mg | ORAL_TABLET | Freq: Every day | ORAL | 2 refills | Status: DC

## 2018-07-21 NOTE — Telephone Encounter (Signed)
New Message  1) Medication(s) Requested (by name): Cholesterol medication   2) Pharmacy of Choice: [facility] [address]  3) Special Requests:   Approved medications will be sent to the pharmacy, we will reach out if there is an issue.  Requests made after 3pm may not be addressed until the following business day!  If a patient is unsure of the name of the medication(s) please note and ask patient to call back when they are able to provide all info, do not send to responsible party until all information is available!

## 2018-07-22 DIAGNOSIS — E1142 Type 2 diabetes mellitus with diabetic polyneuropathy: Secondary | ICD-10-CM | POA: Diagnosis not present

## 2018-07-23 DIAGNOSIS — E1142 Type 2 diabetes mellitus with diabetic polyneuropathy: Secondary | ICD-10-CM | POA: Diagnosis not present

## 2018-07-24 DIAGNOSIS — E1142 Type 2 diabetes mellitus with diabetic polyneuropathy: Secondary | ICD-10-CM | POA: Diagnosis not present

## 2018-07-25 DIAGNOSIS — E1142 Type 2 diabetes mellitus with diabetic polyneuropathy: Secondary | ICD-10-CM | POA: Diagnosis not present

## 2018-07-28 DIAGNOSIS — E1142 Type 2 diabetes mellitus with diabetic polyneuropathy: Secondary | ICD-10-CM | POA: Diagnosis not present

## 2018-07-29 DIAGNOSIS — E1142 Type 2 diabetes mellitus with diabetic polyneuropathy: Secondary | ICD-10-CM | POA: Diagnosis not present

## 2018-07-30 DIAGNOSIS — E1142 Type 2 diabetes mellitus with diabetic polyneuropathy: Secondary | ICD-10-CM | POA: Diagnosis not present

## 2018-07-31 DIAGNOSIS — E1142 Type 2 diabetes mellitus with diabetic polyneuropathy: Secondary | ICD-10-CM | POA: Diagnosis not present

## 2018-08-01 DIAGNOSIS — E1142 Type 2 diabetes mellitus with diabetic polyneuropathy: Secondary | ICD-10-CM | POA: Diagnosis not present

## 2018-08-04 DIAGNOSIS — E1142 Type 2 diabetes mellitus with diabetic polyneuropathy: Secondary | ICD-10-CM | POA: Diagnosis not present

## 2018-08-05 DIAGNOSIS — E1142 Type 2 diabetes mellitus with diabetic polyneuropathy: Secondary | ICD-10-CM | POA: Diagnosis not present

## 2018-08-06 DIAGNOSIS — E1142 Type 2 diabetes mellitus with diabetic polyneuropathy: Secondary | ICD-10-CM | POA: Diagnosis not present

## 2018-08-07 DIAGNOSIS — E1142 Type 2 diabetes mellitus with diabetic polyneuropathy: Secondary | ICD-10-CM | POA: Diagnosis not present

## 2018-08-08 DIAGNOSIS — E1142 Type 2 diabetes mellitus with diabetic polyneuropathy: Secondary | ICD-10-CM | POA: Diagnosis not present

## 2018-08-11 DIAGNOSIS — E1142 Type 2 diabetes mellitus with diabetic polyneuropathy: Secondary | ICD-10-CM | POA: Diagnosis not present

## 2018-08-12 DIAGNOSIS — E1142 Type 2 diabetes mellitus with diabetic polyneuropathy: Secondary | ICD-10-CM | POA: Diagnosis not present

## 2018-08-13 DIAGNOSIS — E1142 Type 2 diabetes mellitus with diabetic polyneuropathy: Secondary | ICD-10-CM | POA: Diagnosis not present

## 2018-08-14 ENCOUNTER — Other Ambulatory Visit: Payer: Self-pay | Admitting: Internal Medicine

## 2018-08-14 DIAGNOSIS — E1142 Type 2 diabetes mellitus with diabetic polyneuropathy: Secondary | ICD-10-CM | POA: Diagnosis not present

## 2018-08-14 DIAGNOSIS — E11311 Type 2 diabetes mellitus with unspecified diabetic retinopathy with macular edema: Secondary | ICD-10-CM

## 2018-08-15 DIAGNOSIS — E1142 Type 2 diabetes mellitus with diabetic polyneuropathy: Secondary | ICD-10-CM | POA: Diagnosis not present

## 2018-08-18 DIAGNOSIS — E1142 Type 2 diabetes mellitus with diabetic polyneuropathy: Secondary | ICD-10-CM | POA: Diagnosis not present

## 2018-08-19 DIAGNOSIS — E1142 Type 2 diabetes mellitus with diabetic polyneuropathy: Secondary | ICD-10-CM | POA: Diagnosis not present

## 2018-08-20 DIAGNOSIS — E1142 Type 2 diabetes mellitus with diabetic polyneuropathy: Secondary | ICD-10-CM | POA: Diagnosis not present

## 2018-08-21 DIAGNOSIS — E1142 Type 2 diabetes mellitus with diabetic polyneuropathy: Secondary | ICD-10-CM | POA: Diagnosis not present

## 2018-08-22 DIAGNOSIS — E1142 Type 2 diabetes mellitus with diabetic polyneuropathy: Secondary | ICD-10-CM | POA: Diagnosis not present

## 2018-08-25 DIAGNOSIS — E1142 Type 2 diabetes mellitus with diabetic polyneuropathy: Secondary | ICD-10-CM | POA: Diagnosis not present

## 2018-08-26 ENCOUNTER — Ambulatory Visit: Payer: Medicaid Other | Attending: Internal Medicine | Admitting: Internal Medicine

## 2018-08-26 ENCOUNTER — Other Ambulatory Visit: Payer: Self-pay

## 2018-08-26 ENCOUNTER — Encounter: Payer: Self-pay | Admitting: Internal Medicine

## 2018-08-26 DIAGNOSIS — E118 Type 2 diabetes mellitus with unspecified complications: Secondary | ICD-10-CM | POA: Diagnosis not present

## 2018-08-26 DIAGNOSIS — G8929 Other chronic pain: Secondary | ICD-10-CM

## 2018-08-26 DIAGNOSIS — E1142 Type 2 diabetes mellitus with diabetic polyneuropathy: Secondary | ICD-10-CM | POA: Diagnosis not present

## 2018-08-26 DIAGNOSIS — I10 Essential (primary) hypertension: Secondary | ICD-10-CM

## 2018-08-26 DIAGNOSIS — R0609 Other forms of dyspnea: Secondary | ICD-10-CM | POA: Diagnosis not present

## 2018-08-26 DIAGNOSIS — M546 Pain in thoracic spine: Secondary | ICD-10-CM | POA: Diagnosis not present

## 2018-08-26 MED ORDER — AMLODIPINE BESYLATE 5 MG PO TABS: 5.0000 mg | ORAL_TABLET | Freq: Every day | ORAL | 6 refills | Status: DC

## 2018-08-26 NOTE — Progress Notes (Signed)
Pt states her blood sugar this morning was 145  

## 2018-08-26 NOTE — Progress Notes (Signed)
Virtual Visit via Telephone Note Due to current restrictions/limitations of in-office visits due to the COVID-19 pandemic, this scheduled clinical appointment was converted to a telehealth visit  I connected with Lisa Olson on 08/26/18 at 2:02 p.m EDT by telephone and verified that I am speaking with the correct person using two identifiers. I am in my office.  The patient is at home.  Only the patient and myself participated in this encounter.  I discussed the limitations, risks, security and privacy concerns of performing an evaluation and management service by telephone and the availability of in person appointments. I also discussed with the patient that there may be a patient responsible charge related to this service. The patient expressed understanding and agreed to proceed.   History of Present Illness: Patient with history ofDMtype II with associated peripheral neuropathy and retinopathy,legally blind in both eyes, HTN, HL,.  Purpose of today's visit is chronic disease management.  Patient was last evaluated 05/2018   DM: last A1C was 8.7 Checking BS 1-4 x a day.  Reports "I have been scared into doing better with this pandemic."  Morning BS lower compared to when she last saw me.  Range is in the 140s. During the day BS 114-180. Lowest has been 95 Eating habits: eating less bread and junk foods.  Usually has a shake in the mornings, light lunch and moderate size dinner Exercise:  Did not get to the gym before it closed due to [place].  Started walking in her neighborhood QOD. Little SOB at times "like a have to catch a yawn."  No CP.  Some SOB at nights which she states has been going on for a while.  She sleeps on 2 pillows Meds:  Reports compliance with meds  Still gets "a sensation in my back" when I eat at nights.  Moves from one side to the other. She states it is not a pain.    HTN:  Tolerating HCTZ which was exchanged for Norvasc on last visit. Continued with Lisinopril.  She  reports that the swelling in the legs went down after stopping the Norvasc 10 mg Checks BP every few days.  Recent readings have been 150/76, 140/70, 137/77  Outpatient Encounter Medications as of 08/26/2018  Medication Sig  . Accu-Chek Softclix Lancets lancets Use as instructed  . atorvastatin (LIPITOR) 20 MG tablet Take 1 tablet (20 mg total) by mouth daily.  . Blood Glucose Monitoring Suppl (ACCU-CHEK AVIVA PLUS) w/Device KIT Use as directed  . Calcium 600-400 MG-UNIT CHEW Chew 1 tablet by mouth daily.  . diclofenac sodium (VOLTAREN) 1 % GEL Apply 2 g topically 4 (four) times daily.  . ferrous sulfate 325 (65 FE) MG tablet Take 1 tablet (325 mg total) by mouth daily with breakfast.  . glimepiride (AMARYL) 4 MG tablet Take 1 tablet (4 mg total) by mouth 2 (two) times daily.  Marland Kitchen glucose blood (ACCU-CHEK AVIVA PLUS) test strip Use as instructed for 3 times daily testing of blood sugar. E11.9  . hydrochlorothiazide (HYDRODIURIL) 25 MG tablet Take 1 tablet (25 mg total) by mouth daily.  . hydrOXYzine (ATARAX/VISTARIL) 10 MG tablet Take 1 tablet (10 mg total) by mouth 3 (three) times daily as needed for itching or anxiety.  Elmore Guise Devices (ACCU-CHEK SOFTCLIX) lancets Use as instructed  . lisinopril (ZESTRIL) 30 MG tablet Take 1 tablet by mouth once daily  . metFORMIN (GLUCOPHAGE) 1000 MG tablet TAKE 1 TABLET BY MOUTH TWICE DAILY WITH MEALS  . Multiple Vitamins-Minerals (MULTIVITAMIN  WITH MINERALS) tablet Take 1 tablet by mouth daily.  Marland Kitchen terbinafine (LAMISIL) 250 MG tablet Take 250 mg by mouth daily.   No facility-administered encounter medications on file as of 08/26/2018.     Observations/Objective: Results for orders placed or performed in visit on 06/03/18  Lipid panel  Result Value Ref Range   Cholesterol, Total 180 100 - 199 mg/dL   Triglycerides 175 (H) 0 - 149 mg/dL   HDL 41 >39 mg/dL   VLDL Cholesterol Cal 35 5 - 40 mg/dL   LDL Calculated 104 (H) 0 - 99 mg/dL   Chol/HDL Ratio 4.4  0.0 - 4.4 ratio  Comprehensive metabolic panel  Result Value Ref Range   Glucose 202 (H) 65 - 99 mg/dL   BUN 23 6 - 24 mg/dL   Creatinine, Ser 0.88 0.57 - 1.00 mg/dL   GFR calc non Af Amer 72 >59 mL/min/1.73   GFR calc Af Amer 83 >59 mL/min/1.73   BUN/Creatinine Ratio 26 (H) 9 - 23   Sodium 136 134 - 144 mmol/L   Potassium 4.4 3.5 - 5.2 mmol/L   Chloride 99 96 - 106 mmol/L   CO2 21 20 - 29 mmol/L   Calcium 9.0 8.7 - 10.2 mg/dL   Total Protein 6.7 6.0 - 8.5 g/dL   Albumin 4.0 3.8 - 4.9 g/dL   Globulin, Total 2.7 1.5 - 4.5 g/dL   Albumin/Globulin Ratio 1.5 1.2 - 2.2   Bilirubin Total 0.3 0.0 - 1.2 mg/dL   Alkaline Phosphatase 70 39 - 117 IU/L   AST 20 0 - 40 IU/L   ALT 17 0 - 32 IU/L  CBC  Result Value Ref Range   WBC 6.0 3.4 - 10.8 x10E3/uL   RBC 4.30 3.77 - 5.28 x10E6/uL   Hemoglobin 10.5 (L) 11.1 - 15.9 g/dL   Hematocrit 33.8 (L) 34.0 - 46.6 %   MCV 79 79 - 97 fL   MCH 24.4 (L) 26.6 - 33.0 pg   MCHC 31.1 (L) 31.5 - 35.7 g/dL   RDW 12.7 11.7 - 15.4 %   Platelets 285 150 - 450 x10E3/uL   NRBC 1 (H) 0 - 0 %     Assessment and Plan: 1. Essential hypertension Home blood pressure readings not at goal.  She will continue lisinopril 30 mg, hydrochlorothiazide 25 mg.  We will add back a low dose of amlodipine.  If she gets any swelling in the legs she will let me know - amLODipine (NORVASC) 5 MG tablet; Take 1 tablet (5 mg total) by mouth daily.  Dispense: 30 tablet; Refill: 6  2. Controlled type 2 diabetes mellitus with complication, without long-term current use of insulin ([place]) Commended her on changes in her eating habits and for moving more.  Continue Amaryl and metformin. - Hemoglobin A1c; Future  3. DOE (dyspnea on exertion) We will have her come to the lab for [place] to check a BNP and recheck CBC.  She has an anemia that has been stable.  Symptoms also warrant an echocardiogram which my CMA will schedule for her - ECHOCARDIOGRAM COMPLETE; Future - Brain natriuretic  peptide; Future - CBC  4. Chronic bilateral thoracic back pain Will address on her in person visit in 3 weeks.   Follow Up Instructions: 3 wks   I discussed the assessment and treatment plan with the patient. The patient was provided an opportunity to ask questions and all were answered. The patient agreed with the plan and demonstrated an understanding of the  instructions.   The patient was advised to call back or seek an in-person evaluation if the symptoms worsen or if the condition fails to improve as anticipated.  I provided 26 minutes of non-face-to-face time during this encounter.   Karle Plumber, MD

## 2018-08-27 DIAGNOSIS — E1142 Type 2 diabetes mellitus with diabetic polyneuropathy: Secondary | ICD-10-CM | POA: Diagnosis not present

## 2018-08-28 ENCOUNTER — Other Ambulatory Visit: Payer: Self-pay

## 2018-08-28 ENCOUNTER — Ambulatory Visit: Payer: Medicaid Other | Attending: Family Medicine

## 2018-08-28 DIAGNOSIS — E118 Type 2 diabetes mellitus with unspecified complications: Secondary | ICD-10-CM

## 2018-08-28 DIAGNOSIS — R0609 Other forms of dyspnea: Secondary | ICD-10-CM | POA: Diagnosis not present

## 2018-08-28 DIAGNOSIS — E1142 Type 2 diabetes mellitus with diabetic polyneuropathy: Secondary | ICD-10-CM | POA: Diagnosis not present

## 2018-08-29 DIAGNOSIS — E1142 Type 2 diabetes mellitus with diabetic polyneuropathy: Secondary | ICD-10-CM | POA: Diagnosis not present

## 2018-08-29 LAB — HEMOGLOBIN A1C
Est. average glucose Bld gHb Est-mCnc: 160 mg/dL
Hgb A1c MFr Bld: 7.2 % — ABNORMAL HIGH (ref 4.8–5.6)

## 2018-08-29 LAB — BRAIN NATRIURETIC PEPTIDE: BNP: 20.9 pg/mL (ref 0.0–100.0)

## 2018-09-01 ENCOUNTER — Telehealth: Payer: Self-pay

## 2018-09-01 DIAGNOSIS — E1142 Type 2 diabetes mellitus with diabetic polyneuropathy: Secondary | ICD-10-CM | POA: Diagnosis not present

## 2018-09-01 NOTE — Telephone Encounter (Signed)
Contacted pt to go over lab results pt didn't answer was unable to lvm due to phone just kept ringing.

## 2018-09-02 DIAGNOSIS — E1142 Type 2 diabetes mellitus with diabetic polyneuropathy: Secondary | ICD-10-CM | POA: Diagnosis not present

## 2018-09-03 DIAGNOSIS — E1142 Type 2 diabetes mellitus with diabetic polyneuropathy: Secondary | ICD-10-CM | POA: Diagnosis not present

## 2018-09-04 ENCOUNTER — Other Ambulatory Visit: Payer: Self-pay

## 2018-09-04 ENCOUNTER — Ambulatory Visit (HOSPITAL_COMMUNITY)
Admission: RE | Admit: 2018-09-04 | Discharge: 2018-09-04 | Disposition: A | Discharge status: Home / Self Care | Payer: Medicaid Other | Source: Ambulatory Visit | Attending: Internal Medicine | Admitting: Internal Medicine

## 2018-09-04 DIAGNOSIS — R0609 Other forms of dyspnea: Secondary | ICD-10-CM | POA: Insufficient documentation

## 2018-09-04 DIAGNOSIS — I350 Nonrheumatic aortic (valve) stenosis: Secondary | ICD-10-CM | POA: Insufficient documentation

## 2018-09-04 DIAGNOSIS — E1142 Type 2 diabetes mellitus with diabetic polyneuropathy: Secondary | ICD-10-CM | POA: Diagnosis not present

## 2018-09-04 NOTE — Progress Notes (Signed)
Echocardiogram 2D Echocardiogram has been performed.  Oneal Deputy Margery Szostak 09/04/2018, 10:35 AM

## 2018-09-05 DIAGNOSIS — E1142 Type 2 diabetes mellitus with diabetic polyneuropathy: Secondary | ICD-10-CM | POA: Diagnosis not present

## 2018-09-08 DIAGNOSIS — E1142 Type 2 diabetes mellitus with diabetic polyneuropathy: Secondary | ICD-10-CM | POA: Diagnosis not present

## 2018-09-09 DIAGNOSIS — E1142 Type 2 diabetes mellitus with diabetic polyneuropathy: Secondary | ICD-10-CM | POA: Diagnosis not present

## 2018-09-10 DIAGNOSIS — E1142 Type 2 diabetes mellitus with diabetic polyneuropathy: Secondary | ICD-10-CM | POA: Diagnosis not present

## 2018-09-11 ENCOUNTER — Telehealth: Payer: Self-pay | Admitting: Internal Medicine

## 2018-09-11 DIAGNOSIS — E1142 Type 2 diabetes mellitus with diabetic polyneuropathy: Secondary | ICD-10-CM | POA: Diagnosis not present

## 2018-09-11 NOTE — Telephone Encounter (Signed)
PC placed to pt today to discuss Echo results.  Pt informed that heart function was nl.  Did show mild LVH thickness.  Stress good BP control.  Mild sclerosis of aortic valve.

## 2018-09-12 DIAGNOSIS — E1142 Type 2 diabetes mellitus with diabetic polyneuropathy: Secondary | ICD-10-CM | POA: Diagnosis not present

## 2018-09-15 ENCOUNTER — Other Ambulatory Visit: Payer: Self-pay | Admitting: Internal Medicine

## 2018-09-15 DIAGNOSIS — E1142 Type 2 diabetes mellitus with diabetic polyneuropathy: Secondary | ICD-10-CM | POA: Diagnosis not present

## 2018-09-16 DIAGNOSIS — E1142 Type 2 diabetes mellitus with diabetic polyneuropathy: Secondary | ICD-10-CM | POA: Diagnosis not present

## 2018-09-17 DIAGNOSIS — E1142 Type 2 diabetes mellitus with diabetic polyneuropathy: Secondary | ICD-10-CM | POA: Diagnosis not present

## 2018-09-18 DIAGNOSIS — E1142 Type 2 diabetes mellitus with diabetic polyneuropathy: Secondary | ICD-10-CM | POA: Diagnosis not present

## 2018-09-19 DIAGNOSIS — E1142 Type 2 diabetes mellitus with diabetic polyneuropathy: Secondary | ICD-10-CM | POA: Diagnosis not present

## 2018-09-22 DIAGNOSIS — E1142 Type 2 diabetes mellitus with diabetic polyneuropathy: Secondary | ICD-10-CM | POA: Diagnosis not present

## 2018-09-23 DIAGNOSIS — E1142 Type 2 diabetes mellitus with diabetic polyneuropathy: Secondary | ICD-10-CM | POA: Diagnosis not present

## 2018-09-24 DIAGNOSIS — E1142 Type 2 diabetes mellitus with diabetic polyneuropathy: Secondary | ICD-10-CM | POA: Diagnosis not present

## 2018-09-25 DIAGNOSIS — M24575 Contracture, left foot: Secondary | ICD-10-CM | POA: Diagnosis not present

## 2018-09-25 DIAGNOSIS — M7989 Other specified soft tissue disorders: Secondary | ICD-10-CM | POA: Diagnosis not present

## 2018-09-25 DIAGNOSIS — I890 Lymphedema, not elsewhere classified: Secondary | ICD-10-CM | POA: Diagnosis not present

## 2018-09-25 DIAGNOSIS — E1142 Type 2 diabetes mellitus with diabetic polyneuropathy: Secondary | ICD-10-CM | POA: Diagnosis not present

## 2018-09-25 DIAGNOSIS — M24574 Contracture, right foot: Secondary | ICD-10-CM | POA: Diagnosis not present

## 2018-09-26 DIAGNOSIS — E1142 Type 2 diabetes mellitus with diabetic polyneuropathy: Secondary | ICD-10-CM | POA: Diagnosis not present

## 2018-09-29 DIAGNOSIS — E1142 Type 2 diabetes mellitus with diabetic polyneuropathy: Secondary | ICD-10-CM | POA: Diagnosis not present

## 2018-09-30 DIAGNOSIS — E1142 Type 2 diabetes mellitus with diabetic polyneuropathy: Secondary | ICD-10-CM | POA: Diagnosis not present

## 2018-10-01 DIAGNOSIS — E1142 Type 2 diabetes mellitus with diabetic polyneuropathy: Secondary | ICD-10-CM | POA: Diagnosis not present

## 2018-10-02 DIAGNOSIS — E1142 Type 2 diabetes mellitus with diabetic polyneuropathy: Secondary | ICD-10-CM | POA: Diagnosis not present

## 2018-10-03 DIAGNOSIS — E1142 Type 2 diabetes mellitus with diabetic polyneuropathy: Secondary | ICD-10-CM | POA: Diagnosis not present

## 2018-10-06 DIAGNOSIS — E1142 Type 2 diabetes mellitus with diabetic polyneuropathy: Secondary | ICD-10-CM | POA: Diagnosis not present

## 2018-10-07 DIAGNOSIS — E1142 Type 2 diabetes mellitus with diabetic polyneuropathy: Secondary | ICD-10-CM | POA: Diagnosis not present

## 2018-10-08 DIAGNOSIS — E1142 Type 2 diabetes mellitus with diabetic polyneuropathy: Secondary | ICD-10-CM | POA: Diagnosis not present

## 2018-10-09 DIAGNOSIS — E1142 Type 2 diabetes mellitus with diabetic polyneuropathy: Secondary | ICD-10-CM | POA: Diagnosis not present

## 2018-10-10 DIAGNOSIS — E1142 Type 2 diabetes mellitus with diabetic polyneuropathy: Secondary | ICD-10-CM | POA: Diagnosis not present

## 2018-10-13 DIAGNOSIS — E1142 Type 2 diabetes mellitus with diabetic polyneuropathy: Secondary | ICD-10-CM | POA: Diagnosis not present

## 2018-10-14 DIAGNOSIS — E1142 Type 2 diabetes mellitus with diabetic polyneuropathy: Secondary | ICD-10-CM | POA: Diagnosis not present

## 2018-10-15 DIAGNOSIS — E1142 Type 2 diabetes mellitus with diabetic polyneuropathy: Secondary | ICD-10-CM | POA: Diagnosis not present

## 2018-10-16 DIAGNOSIS — E1142 Type 2 diabetes mellitus with diabetic polyneuropathy: Secondary | ICD-10-CM | POA: Diagnosis not present

## 2018-10-17 DIAGNOSIS — E1142 Type 2 diabetes mellitus with diabetic polyneuropathy: Secondary | ICD-10-CM | POA: Diagnosis not present

## 2018-10-20 DIAGNOSIS — E1142 Type 2 diabetes mellitus with diabetic polyneuropathy: Secondary | ICD-10-CM | POA: Diagnosis not present

## 2018-10-21 DIAGNOSIS — E1142 Type 2 diabetes mellitus with diabetic polyneuropathy: Secondary | ICD-10-CM | POA: Diagnosis not present

## 2018-10-22 DIAGNOSIS — E1142 Type 2 diabetes mellitus with diabetic polyneuropathy: Secondary | ICD-10-CM | POA: Diagnosis not present

## 2018-10-23 DIAGNOSIS — E1142 Type 2 diabetes mellitus with diabetic polyneuropathy: Secondary | ICD-10-CM | POA: Diagnosis not present

## 2018-10-24 DIAGNOSIS — E1142 Type 2 diabetes mellitus with diabetic polyneuropathy: Secondary | ICD-10-CM | POA: Diagnosis not present

## 2018-10-27 DIAGNOSIS — E1142 Type 2 diabetes mellitus with diabetic polyneuropathy: Secondary | ICD-10-CM | POA: Diagnosis not present

## 2018-10-28 DIAGNOSIS — E1142 Type 2 diabetes mellitus with diabetic polyneuropathy: Secondary | ICD-10-CM | POA: Diagnosis not present

## 2018-10-29 DIAGNOSIS — E1142 Type 2 diabetes mellitus with diabetic polyneuropathy: Secondary | ICD-10-CM | POA: Diagnosis not present

## 2018-10-30 DIAGNOSIS — E1142 Type 2 diabetes mellitus with diabetic polyneuropathy: Secondary | ICD-10-CM | POA: Diagnosis not present

## 2018-10-31 DIAGNOSIS — E1142 Type 2 diabetes mellitus with diabetic polyneuropathy: Secondary | ICD-10-CM | POA: Diagnosis not present

## 2018-11-02 ENCOUNTER — Other Ambulatory Visit: Payer: Self-pay | Admitting: Internal Medicine

## 2018-11-02 DIAGNOSIS — I10 Essential (primary) hypertension: Secondary | ICD-10-CM

## 2018-11-03 DIAGNOSIS — E1142 Type 2 diabetes mellitus with diabetic polyneuropathy: Secondary | ICD-10-CM | POA: Diagnosis not present

## 2018-11-04 DIAGNOSIS — E1142 Type 2 diabetes mellitus with diabetic polyneuropathy: Secondary | ICD-10-CM | POA: Diagnosis not present

## 2018-11-05 DIAGNOSIS — E1142 Type 2 diabetes mellitus with diabetic polyneuropathy: Secondary | ICD-10-CM | POA: Diagnosis not present

## 2018-11-06 DIAGNOSIS — E1142 Type 2 diabetes mellitus with diabetic polyneuropathy: Secondary | ICD-10-CM | POA: Diagnosis not present

## 2018-11-07 DIAGNOSIS — E1142 Type 2 diabetes mellitus with diabetic polyneuropathy: Secondary | ICD-10-CM | POA: Diagnosis not present

## 2018-11-10 DIAGNOSIS — E1142 Type 2 diabetes mellitus with diabetic polyneuropathy: Secondary | ICD-10-CM | POA: Diagnosis not present

## 2018-11-11 DIAGNOSIS — E1142 Type 2 diabetes mellitus with diabetic polyneuropathy: Secondary | ICD-10-CM | POA: Diagnosis not present

## 2018-11-12 DIAGNOSIS — E1142 Type 2 diabetes mellitus with diabetic polyneuropathy: Secondary | ICD-10-CM | POA: Diagnosis not present

## 2018-11-13 DIAGNOSIS — E1142 Type 2 diabetes mellitus with diabetic polyneuropathy: Secondary | ICD-10-CM | POA: Diagnosis not present

## 2018-11-14 DIAGNOSIS — E1142 Type 2 diabetes mellitus with diabetic polyneuropathy: Secondary | ICD-10-CM | POA: Diagnosis not present

## 2018-11-17 DIAGNOSIS — E1142 Type 2 diabetes mellitus with diabetic polyneuropathy: Secondary | ICD-10-CM | POA: Diagnosis not present

## 2018-11-18 DIAGNOSIS — E1142 Type 2 diabetes mellitus with diabetic polyneuropathy: Secondary | ICD-10-CM | POA: Diagnosis not present

## 2018-11-19 DIAGNOSIS — E1142 Type 2 diabetes mellitus with diabetic polyneuropathy: Secondary | ICD-10-CM | POA: Diagnosis not present

## 2018-11-20 DIAGNOSIS — E1142 Type 2 diabetes mellitus with diabetic polyneuropathy: Secondary | ICD-10-CM | POA: Diagnosis not present

## 2018-11-21 DIAGNOSIS — E1142 Type 2 diabetes mellitus with diabetic polyneuropathy: Secondary | ICD-10-CM | POA: Diagnosis not present

## 2018-11-24 DIAGNOSIS — E1142 Type 2 diabetes mellitus with diabetic polyneuropathy: Secondary | ICD-10-CM | POA: Diagnosis not present

## 2018-11-25 DIAGNOSIS — E1142 Type 2 diabetes mellitus with diabetic polyneuropathy: Secondary | ICD-10-CM | POA: Diagnosis not present

## 2018-11-26 DIAGNOSIS — E1142 Type 2 diabetes mellitus with diabetic polyneuropathy: Secondary | ICD-10-CM | POA: Diagnosis not present

## 2018-11-27 ENCOUNTER — Encounter: Payer: Self-pay | Admitting: Internal Medicine

## 2018-11-27 ENCOUNTER — Ambulatory Visit: Payer: Medicaid Other | Attending: Internal Medicine | Admitting: Internal Medicine

## 2018-11-27 DIAGNOSIS — E118 Type 2 diabetes mellitus with unspecified complications: Secondary | ICD-10-CM

## 2018-11-27 DIAGNOSIS — Z2821 Immunization not carried out because of patient refusal: Secondary | ICD-10-CM | POA: Diagnosis not present

## 2018-11-27 DIAGNOSIS — I10 Essential (primary) hypertension: Secondary | ICD-10-CM

## 2018-11-27 DIAGNOSIS — E1142 Type 2 diabetes mellitus with diabetic polyneuropathy: Secondary | ICD-10-CM | POA: Diagnosis not present

## 2018-11-27 NOTE — Progress Notes (Signed)
Virtual Visit via Telephone Note Due to current restrictions/limitations of in-office visits due to the COVID-19 pandemic, this scheduled clinical appointment was converted to a telehealth visit Pt request tele-vist due to transportation issues I connected with Lisa Olson on 11/27/18 at 1:41 p.m by telephone and verified that I am speaking with the correct person using two identifiers. I am in my office.  The patient is at home.  Only the patient and myself participated in this encounter.  I discussed the limitations, risks, security and privacy concerns of performing an evaluation and management service by telephone and the availability of in person appointments. I also discussed with the patient that there may be a patient responsible charge related to this service. The patient expressed understanding and agreed to proceed.  History of Present Illness: Patient with history ofDMtype II with associated peripheral neuropathy and retinopathy,legally blind in both eyes, HTN, HL.     DIABETES TYPE 2 Last A1C:   Results for orders placed or performed in visit on 08/28/18  Brain natriuretic peptide  Result Value Ref Range   BNP 20.9 0.0 - 100.0 pg/mL  Hemoglobin A1c  Result Value Ref Range   Hgb A1c MFr Bld 7.2 (H) 4.8 - 5.6 %   Est. average glucose Bld gHb Est-mCnc 160 mg/dL    Med Adherence:  [x]  Yes - metformin BID but taking Amaryl 4 mg a.m and 2 mg in p.m  Instead of 4 mg BID Medication side effects:  []  Yes    [x]  No Home Monitoring?  [x]  Yes    []  No Home glucose results range: BS was 128 this morning.  Checks BS TID before meals.  Range 128-140 in mornings.  Lowest is 95 Diet Adherence: [x]  Yes -eating less Exercise: [x]  Yes  -  QOD walking Hypoglycemic episodes?: []  Yes    [x]  No Numbness of the feet? [x]  Yes    []  No Retinopathy hx? [x]  Yes    []  No Last eye exam:  Saw ophthalmologist the end of June Dr. Linton Flemings Comments:  HYPERTENSION Currently taking: see medication  list Med Adherence: [x]  No - filled the Norvasc but did not start it because she is afraid it may cause LE edema as it did last time.  Wears compression socks.  No LE edema at this time Medication side effects: []  Yes    []  No Adherence with salt restriction: []  Yes    []  No Home Monitoring?: [x]  Yes .  Today was 145/83 Monitoring Frequency: [x]  daily Home BP results range: 130s/80s SOB? []  Yes    [x]  No Chest Pain?: []  Yes    [x]  No - SOB resolved Leg swelling?: []  Yes    [x]  No Headaches?: []  Yes    [x]  No Dizziness? []  Yes    [x]  No Comments:   HM:  Due for flu, Prevnar 13, tdap.  Pt states she has not made up her mind about getting her vaccines. Friends told her that flu shot can cause you to get the flu.   Outpatient Encounter Medications as of 11/27/2018  Medication Sig  . Accu-Chek Softclix Lancets lancets Use as instructed  . amLODipine (NORVASC) 5 MG tablet Take 1 tablet (5 mg total) by mouth daily.  Marland Kitchen atorvastatin (LIPITOR) 20 MG tablet Take 1 tablet (20 mg total) by mouth daily.  . Blood Glucose Monitoring Suppl (ACCU-CHEK AVIVA PLUS) w/Device KIT Use as directed  . Calcium 600-400 MG-UNIT CHEW Chew 1 tablet by mouth daily.  . diclofenac  sodium (VOLTAREN) 1 % GEL Apply 2 g topically 4 (four) times daily.  . ferrous sulfate 325 (65 FE) MG tablet Take 1 tablet (325 mg total) by mouth daily with breakfast.  . glimepiride (AMARYL) 4 MG tablet Take 1 tablet (4 mg total) by mouth 2 (two) times daily.  Marland Kitchen glucose blood test strip USE 1 STRIP TO CHECK GLUCOSE THREE TIMES DAILY AS DIRECTED  . hydrochlorothiazide (HYDRODIURIL) 25 MG tablet Take 1 tablet (25 mg total) by mouth daily.  . hydrOXYzine (ATARAX/VISTARIL) 10 MG tablet Take 1 tablet (10 mg total) by mouth 3 (three) times daily as needed for itching or anxiety.  Elmore Guise Devices (ACCU-CHEK SOFTCLIX) lancets Use as instructed  . lisinopril (ZESTRIL) 30 MG tablet Take 1 tablet by mouth once daily  . metFORMIN (GLUCOPHAGE) 1000 MG  tablet TAKE 1 TABLET BY MOUTH TWICE DAILY WITH MEALS  . Multiple Vitamins-Minerals (MULTIVITAMIN WITH MINERALS) tablet Take 1 tablet by mouth daily.  Marland Kitchen terbinafine (LAMISIL) 250 MG tablet Take 250 mg by mouth daily.   No facility-administered encounter medications on file as of 11/27/2018.     Observations/Objective: Results for orders placed or performed in visit on 08/28/18  Brain natriuretic peptide  Result Value Ref Range   BNP 20.9 0.0 - 100.0 pg/mL  Hemoglobin A1c  Result Value Ref Range   Hgb A1c MFr Bld 7.2 (H) 4.8 - 5.6 %   Est. average glucose Bld gHb Est-mCnc 160 mg/dL     Chemistry      Component Value Date/Time   NA 136 06/03/2018 1113   K 4.4 06/03/2018 1113   CL 99 06/03/2018 1113   CO2 21 06/03/2018 1113   BUN 23 06/03/2018 1113   CREATININE 0.88 06/03/2018 1113   CREATININE 0.93 02/13/2016 1746      Component Value Date/Time   CALCIUM 9.0 06/03/2018 1113   ALKPHOS 70 06/03/2018 1113   AST 20 06/03/2018 1113   ALT 17 06/03/2018 1113   BILITOT 0.3 06/03/2018 1113     Lab Results  Component Value Date   WBC 6.0 06/03/2018   HGB 10.5 (L) 06/03/2018   HCT 33.8 (L) 06/03/2018   MCV 79 06/03/2018   PLT 285 06/03/2018    Assessment and Plan: 1. Controlled type 2 diabetes mellitus with complication, without long-term current use of insulin ([place]) Patient to continue metformin and Amaryl. Encouraged her to continue healthy eating habits and regular exercise that she has been doing. She is up-to-date with her eye exam.  2. Essential hypertension Reported home blood pressure readings are not at goal.  Low-dose Norvasc added on last visit but patient has not started taking this yet for fear of lower extremity edema.  Advised her to start low by cutting the 5 mg tablet in half and taking a half a tablet daily of the amlodipine.  Continue current dose of lisinopril and hydrochlorothiazide  3. Influenza vaccination declined 4. Pneumococcal vaccination declined by  patient -Advised patient that the flu shot does not cause a person to get the flu as it is not a live vaccine.  However patient still reluctant to get the flu shot and the Pneumovax.  Follow Up Instructions: F/u in 2 mths   I discussed the assessment and treatment plan with the patient. The patient was provided an opportunity to ask questions and all were answered. The patient agreed with the plan and demonstrated an understanding of the instructions.   The patient was advised to call back or seek  an in-person evaluation if the symptoms worsen or if the condition fails to improve as anticipated.  I provided 18 minutes of non-face-to-face time during this encounter.   Karle Plumber, MD

## 2018-11-27 NOTE — Progress Notes (Signed)
Pt states her sugar this morning was 128

## 2018-11-28 DIAGNOSIS — E1142 Type 2 diabetes mellitus with diabetic polyneuropathy: Secondary | ICD-10-CM | POA: Diagnosis not present

## 2018-12-01 DIAGNOSIS — E1142 Type 2 diabetes mellitus with diabetic polyneuropathy: Secondary | ICD-10-CM | POA: Diagnosis not present

## 2018-12-02 DIAGNOSIS — E1142 Type 2 diabetes mellitus with diabetic polyneuropathy: Secondary | ICD-10-CM | POA: Diagnosis not present

## 2018-12-03 DIAGNOSIS — E1142 Type 2 diabetes mellitus with diabetic polyneuropathy: Secondary | ICD-10-CM | POA: Diagnosis not present

## 2018-12-04 DIAGNOSIS — E1142 Type 2 diabetes mellitus with diabetic polyneuropathy: Secondary | ICD-10-CM | POA: Diagnosis not present

## 2018-12-05 DIAGNOSIS — E1142 Type 2 diabetes mellitus with diabetic polyneuropathy: Secondary | ICD-10-CM | POA: Diagnosis not present

## 2018-12-08 DIAGNOSIS — E1142 Type 2 diabetes mellitus with diabetic polyneuropathy: Secondary | ICD-10-CM | POA: Diagnosis not present

## 2018-12-09 DIAGNOSIS — E1142 Type 2 diabetes mellitus with diabetic polyneuropathy: Secondary | ICD-10-CM | POA: Diagnosis not present

## 2018-12-10 DIAGNOSIS — E1142 Type 2 diabetes mellitus with diabetic polyneuropathy: Secondary | ICD-10-CM | POA: Diagnosis not present

## 2018-12-11 DIAGNOSIS — E1142 Type 2 diabetes mellitus with diabetic polyneuropathy: Secondary | ICD-10-CM | POA: Diagnosis not present

## 2018-12-12 DIAGNOSIS — E1142 Type 2 diabetes mellitus with diabetic polyneuropathy: Secondary | ICD-10-CM | POA: Diagnosis not present

## 2018-12-15 DIAGNOSIS — E1142 Type 2 diabetes mellitus with diabetic polyneuropathy: Secondary | ICD-10-CM | POA: Diagnosis not present

## 2018-12-16 DIAGNOSIS — E1142 Type 2 diabetes mellitus with diabetic polyneuropathy: Secondary | ICD-10-CM | POA: Diagnosis not present

## 2018-12-17 DIAGNOSIS — E1142 Type 2 diabetes mellitus with diabetic polyneuropathy: Secondary | ICD-10-CM | POA: Diagnosis not present

## 2018-12-18 DIAGNOSIS — E1142 Type 2 diabetes mellitus with diabetic polyneuropathy: Secondary | ICD-10-CM | POA: Diagnosis not present

## 2018-12-19 DIAGNOSIS — E1142 Type 2 diabetes mellitus with diabetic polyneuropathy: Secondary | ICD-10-CM | POA: Diagnosis not present

## 2018-12-22 DIAGNOSIS — E1142 Type 2 diabetes mellitus with diabetic polyneuropathy: Secondary | ICD-10-CM | POA: Diagnosis not present

## 2018-12-23 DIAGNOSIS — E1142 Type 2 diabetes mellitus with diabetic polyneuropathy: Secondary | ICD-10-CM | POA: Diagnosis not present

## 2018-12-24 DIAGNOSIS — E1142 Type 2 diabetes mellitus with diabetic polyneuropathy: Secondary | ICD-10-CM | POA: Diagnosis not present

## 2018-12-25 DIAGNOSIS — E1142 Type 2 diabetes mellitus with diabetic polyneuropathy: Secondary | ICD-10-CM | POA: Diagnosis not present

## 2018-12-26 DIAGNOSIS — E1142 Type 2 diabetes mellitus with diabetic polyneuropathy: Secondary | ICD-10-CM | POA: Diagnosis not present

## 2018-12-29 DIAGNOSIS — E1142 Type 2 diabetes mellitus with diabetic polyneuropathy: Secondary | ICD-10-CM | POA: Diagnosis not present

## 2018-12-30 DIAGNOSIS — E1142 Type 2 diabetes mellitus with diabetic polyneuropathy: Secondary | ICD-10-CM | POA: Diagnosis not present

## 2018-12-31 DIAGNOSIS — E1142 Type 2 diabetes mellitus with diabetic polyneuropathy: Secondary | ICD-10-CM | POA: Diagnosis not present

## 2019-01-01 DIAGNOSIS — E1142 Type 2 diabetes mellitus with diabetic polyneuropathy: Secondary | ICD-10-CM | POA: Diagnosis not present

## 2019-01-02 DIAGNOSIS — E1142 Type 2 diabetes mellitus with diabetic polyneuropathy: Secondary | ICD-10-CM | POA: Diagnosis not present

## 2019-01-05 DIAGNOSIS — B351 Tinea unguium: Secondary | ICD-10-CM | POA: Diagnosis not present

## 2019-01-05 DIAGNOSIS — L03031 Cellulitis of right toe: Secondary | ICD-10-CM | POA: Diagnosis not present

## 2019-01-05 DIAGNOSIS — E1142 Type 2 diabetes mellitus with diabetic polyneuropathy: Secondary | ICD-10-CM | POA: Diagnosis not present

## 2019-01-05 DIAGNOSIS — L03032 Cellulitis of left toe: Secondary | ICD-10-CM | POA: Diagnosis not present

## 2019-01-06 DIAGNOSIS — E1142 Type 2 diabetes mellitus with diabetic polyneuropathy: Secondary | ICD-10-CM | POA: Diagnosis not present

## 2019-01-07 ENCOUNTER — Other Ambulatory Visit: Payer: Self-pay | Admitting: Internal Medicine

## 2019-01-07 DIAGNOSIS — Z1231 Encounter for screening mammogram for malignant neoplasm of breast: Secondary | ICD-10-CM

## 2019-01-07 DIAGNOSIS — E1142 Type 2 diabetes mellitus with diabetic polyneuropathy: Secondary | ICD-10-CM | POA: Diagnosis not present

## 2019-01-08 DIAGNOSIS — E1142 Type 2 diabetes mellitus with diabetic polyneuropathy: Secondary | ICD-10-CM | POA: Diagnosis not present

## 2019-01-09 DIAGNOSIS — E1142 Type 2 diabetes mellitus with diabetic polyneuropathy: Secondary | ICD-10-CM | POA: Diagnosis not present

## 2019-01-12 DIAGNOSIS — E1142 Type 2 diabetes mellitus with diabetic polyneuropathy: Secondary | ICD-10-CM | POA: Diagnosis not present

## 2019-01-13 DIAGNOSIS — E1142 Type 2 diabetes mellitus with diabetic polyneuropathy: Secondary | ICD-10-CM | POA: Diagnosis not present

## 2019-01-14 DIAGNOSIS — E1142 Type 2 diabetes mellitus with diabetic polyneuropathy: Secondary | ICD-10-CM | POA: Diagnosis not present

## 2019-01-15 DIAGNOSIS — E1142 Type 2 diabetes mellitus with diabetic polyneuropathy: Secondary | ICD-10-CM | POA: Diagnosis not present

## 2019-01-16 DIAGNOSIS — E1142 Type 2 diabetes mellitus with diabetic polyneuropathy: Secondary | ICD-10-CM | POA: Diagnosis not present

## 2019-01-19 DIAGNOSIS — E1142 Type 2 diabetes mellitus with diabetic polyneuropathy: Secondary | ICD-10-CM | POA: Diagnosis not present

## 2019-01-20 DIAGNOSIS — E1142 Type 2 diabetes mellitus with diabetic polyneuropathy: Secondary | ICD-10-CM | POA: Diagnosis not present

## 2019-01-21 DIAGNOSIS — E1142 Type 2 diabetes mellitus with diabetic polyneuropathy: Secondary | ICD-10-CM | POA: Diagnosis not present

## 2019-01-22 ENCOUNTER — Other Ambulatory Visit: Payer: Self-pay | Admitting: Internal Medicine

## 2019-01-22 DIAGNOSIS — E11311 Type 2 diabetes mellitus with unspecified diabetic retinopathy with macular edema: Secondary | ICD-10-CM

## 2019-01-22 DIAGNOSIS — E1142 Type 2 diabetes mellitus with diabetic polyneuropathy: Secondary | ICD-10-CM | POA: Diagnosis not present

## 2019-01-22 DIAGNOSIS — I10 Essential (primary) hypertension: Secondary | ICD-10-CM

## 2019-01-23 DIAGNOSIS — E1142 Type 2 diabetes mellitus with diabetic polyneuropathy: Secondary | ICD-10-CM | POA: Diagnosis not present

## 2019-01-26 DIAGNOSIS — E1142 Type 2 diabetes mellitus with diabetic polyneuropathy: Secondary | ICD-10-CM | POA: Diagnosis not present

## 2019-01-27 DIAGNOSIS — E1142 Type 2 diabetes mellitus with diabetic polyneuropathy: Secondary | ICD-10-CM | POA: Diagnosis not present

## 2019-01-28 DIAGNOSIS — E1142 Type 2 diabetes mellitus with diabetic polyneuropathy: Secondary | ICD-10-CM | POA: Diagnosis not present

## 2019-01-29 DIAGNOSIS — E1142 Type 2 diabetes mellitus with diabetic polyneuropathy: Secondary | ICD-10-CM | POA: Diagnosis not present

## 2019-01-30 DIAGNOSIS — E1142 Type 2 diabetes mellitus with diabetic polyneuropathy: Secondary | ICD-10-CM | POA: Diagnosis not present

## 2019-02-02 DIAGNOSIS — E1142 Type 2 diabetes mellitus with diabetic polyneuropathy: Secondary | ICD-10-CM | POA: Diagnosis not present

## 2019-02-03 DIAGNOSIS — E1142 Type 2 diabetes mellitus with diabetic polyneuropathy: Secondary | ICD-10-CM | POA: Diagnosis not present

## 2019-02-04 DIAGNOSIS — E1142 Type 2 diabetes mellitus with diabetic polyneuropathy: Secondary | ICD-10-CM | POA: Diagnosis not present

## 2019-02-05 ENCOUNTER — Other Ambulatory Visit: Payer: Self-pay | Admitting: Internal Medicine

## 2019-02-05 DIAGNOSIS — E1142 Type 2 diabetes mellitus with diabetic polyneuropathy: Secondary | ICD-10-CM | POA: Diagnosis not present

## 2019-02-05 DIAGNOSIS — E11311 Type 2 diabetes mellitus with unspecified diabetic retinopathy with macular edema: Secondary | ICD-10-CM

## 2019-02-06 DIAGNOSIS — E1142 Type 2 diabetes mellitus with diabetic polyneuropathy: Secondary | ICD-10-CM | POA: Diagnosis not present

## 2019-02-09 DIAGNOSIS — E1142 Type 2 diabetes mellitus with diabetic polyneuropathy: Secondary | ICD-10-CM | POA: Diagnosis not present

## 2019-02-10 DIAGNOSIS — E1142 Type 2 diabetes mellitus with diabetic polyneuropathy: Secondary | ICD-10-CM | POA: Diagnosis not present

## 2019-02-11 DIAGNOSIS — E1142 Type 2 diabetes mellitus with diabetic polyneuropathy: Secondary | ICD-10-CM | POA: Diagnosis not present

## 2019-02-12 DIAGNOSIS — E1142 Type 2 diabetes mellitus with diabetic polyneuropathy: Secondary | ICD-10-CM | POA: Diagnosis not present

## 2019-02-13 DIAGNOSIS — E1142 Type 2 diabetes mellitus with diabetic polyneuropathy: Secondary | ICD-10-CM | POA: Diagnosis not present

## 2019-02-16 DIAGNOSIS — E1142 Type 2 diabetes mellitus with diabetic polyneuropathy: Secondary | ICD-10-CM | POA: Diagnosis not present

## 2019-02-17 DIAGNOSIS — E1142 Type 2 diabetes mellitus with diabetic polyneuropathy: Secondary | ICD-10-CM | POA: Diagnosis not present

## 2019-02-18 DIAGNOSIS — E1142 Type 2 diabetes mellitus with diabetic polyneuropathy: Secondary | ICD-10-CM | POA: Diagnosis not present

## 2019-02-19 DIAGNOSIS — E1142 Type 2 diabetes mellitus with diabetic polyneuropathy: Secondary | ICD-10-CM | POA: Diagnosis not present

## 2019-02-20 DIAGNOSIS — E1142 Type 2 diabetes mellitus with diabetic polyneuropathy: Secondary | ICD-10-CM | POA: Diagnosis not present

## 2019-02-23 DIAGNOSIS — E1142 Type 2 diabetes mellitus with diabetic polyneuropathy: Secondary | ICD-10-CM | POA: Diagnosis not present

## 2019-02-24 DIAGNOSIS — E1142 Type 2 diabetes mellitus with diabetic polyneuropathy: Secondary | ICD-10-CM | POA: Diagnosis not present

## 2019-02-25 DIAGNOSIS — E1142 Type 2 diabetes mellitus with diabetic polyneuropathy: Secondary | ICD-10-CM | POA: Diagnosis not present

## 2019-02-26 ENCOUNTER — Other Ambulatory Visit: Payer: Self-pay

## 2019-02-26 ENCOUNTER — Ambulatory Visit
Admission: RE | Admit: 2019-02-26 | Discharge: 2019-02-26 | Disposition: A | Discharge status: Home / Self Care | Payer: Medicaid Other | Source: Ambulatory Visit | Attending: Internal Medicine | Admitting: Internal Medicine

## 2019-02-26 DIAGNOSIS — Z1231 Encounter for screening mammogram for malignant neoplasm of breast: Secondary | ICD-10-CM | POA: Diagnosis not present

## 2019-02-26 DIAGNOSIS — E1142 Type 2 diabetes mellitus with diabetic polyneuropathy: Secondary | ICD-10-CM | POA: Diagnosis not present

## 2019-02-27 DIAGNOSIS — E1142 Type 2 diabetes mellitus with diabetic polyneuropathy: Secondary | ICD-10-CM | POA: Diagnosis not present

## 2019-03-02 DIAGNOSIS — E1142 Type 2 diabetes mellitus with diabetic polyneuropathy: Secondary | ICD-10-CM | POA: Diagnosis not present

## 2019-03-03 DIAGNOSIS — E1142 Type 2 diabetes mellitus with diabetic polyneuropathy: Secondary | ICD-10-CM | POA: Diagnosis not present

## 2019-03-04 DIAGNOSIS — E1142 Type 2 diabetes mellitus with diabetic polyneuropathy: Secondary | ICD-10-CM | POA: Diagnosis not present

## 2019-03-05 DIAGNOSIS — E1142 Type 2 diabetes mellitus with diabetic polyneuropathy: Secondary | ICD-10-CM | POA: Diagnosis not present

## 2019-03-06 DIAGNOSIS — E1142 Type 2 diabetes mellitus with diabetic polyneuropathy: Secondary | ICD-10-CM | POA: Diagnosis not present

## 2019-03-09 DIAGNOSIS — E1142 Type 2 diabetes mellitus with diabetic polyneuropathy: Secondary | ICD-10-CM | POA: Diagnosis not present

## 2019-03-10 DIAGNOSIS — E1142 Type 2 diabetes mellitus with diabetic polyneuropathy: Secondary | ICD-10-CM | POA: Diagnosis not present

## 2019-03-11 DIAGNOSIS — E1142 Type 2 diabetes mellitus with diabetic polyneuropathy: Secondary | ICD-10-CM | POA: Diagnosis not present

## 2019-03-12 DIAGNOSIS — E1142 Type 2 diabetes mellitus with diabetic polyneuropathy: Secondary | ICD-10-CM | POA: Diagnosis not present

## 2019-03-13 DIAGNOSIS — E1142 Type 2 diabetes mellitus with diabetic polyneuropathy: Secondary | ICD-10-CM | POA: Diagnosis not present

## 2019-03-16 DIAGNOSIS — E1142 Type 2 diabetes mellitus with diabetic polyneuropathy: Secondary | ICD-10-CM | POA: Diagnosis not present

## 2019-03-17 DIAGNOSIS — E1142 Type 2 diabetes mellitus with diabetic polyneuropathy: Secondary | ICD-10-CM | POA: Diagnosis not present

## 2019-03-18 DIAGNOSIS — E1142 Type 2 diabetes mellitus with diabetic polyneuropathy: Secondary | ICD-10-CM | POA: Diagnosis not present

## 2019-03-19 DIAGNOSIS — E1142 Type 2 diabetes mellitus with diabetic polyneuropathy: Secondary | ICD-10-CM | POA: Diagnosis not present

## 2019-03-20 DIAGNOSIS — E1142 Type 2 diabetes mellitus with diabetic polyneuropathy: Secondary | ICD-10-CM | POA: Diagnosis not present

## 2019-03-23 DIAGNOSIS — E1142 Type 2 diabetes mellitus with diabetic polyneuropathy: Secondary | ICD-10-CM | POA: Diagnosis not present

## 2019-03-24 DIAGNOSIS — E1142 Type 2 diabetes mellitus with diabetic polyneuropathy: Secondary | ICD-10-CM | POA: Diagnosis not present

## 2019-03-25 DIAGNOSIS — E1142 Type 2 diabetes mellitus with diabetic polyneuropathy: Secondary | ICD-10-CM | POA: Diagnosis not present

## 2019-03-26 DIAGNOSIS — E1142 Type 2 diabetes mellitus with diabetic polyneuropathy: Secondary | ICD-10-CM | POA: Diagnosis not present

## 2019-03-27 DIAGNOSIS — E1142 Type 2 diabetes mellitus with diabetic polyneuropathy: Secondary | ICD-10-CM | POA: Diagnosis not present

## 2019-03-30 DIAGNOSIS — E1142 Type 2 diabetes mellitus with diabetic polyneuropathy: Secondary | ICD-10-CM | POA: Diagnosis not present

## 2019-03-31 DIAGNOSIS — E1142 Type 2 diabetes mellitus with diabetic polyneuropathy: Secondary | ICD-10-CM | POA: Diagnosis not present

## 2019-04-01 DIAGNOSIS — E1142 Type 2 diabetes mellitus with diabetic polyneuropathy: Secondary | ICD-10-CM | POA: Diagnosis not present

## 2019-04-02 DIAGNOSIS — E1142 Type 2 diabetes mellitus with diabetic polyneuropathy: Secondary | ICD-10-CM | POA: Diagnosis not present

## 2019-04-03 DIAGNOSIS — E1142 Type 2 diabetes mellitus with diabetic polyneuropathy: Secondary | ICD-10-CM | POA: Diagnosis not present

## 2019-04-06 DIAGNOSIS — E1142 Type 2 diabetes mellitus with diabetic polyneuropathy: Secondary | ICD-10-CM | POA: Diagnosis not present

## 2019-04-07 DIAGNOSIS — E1142 Type 2 diabetes mellitus with diabetic polyneuropathy: Secondary | ICD-10-CM | POA: Diagnosis not present

## 2019-04-08 DIAGNOSIS — E1142 Type 2 diabetes mellitus with diabetic polyneuropathy: Secondary | ICD-10-CM | POA: Diagnosis not present

## 2019-04-09 DIAGNOSIS — E1142 Type 2 diabetes mellitus with diabetic polyneuropathy: Secondary | ICD-10-CM | POA: Diagnosis not present

## 2019-04-10 DIAGNOSIS — E1142 Type 2 diabetes mellitus with diabetic polyneuropathy: Secondary | ICD-10-CM | POA: Diagnosis not present

## 2019-04-13 DIAGNOSIS — E1142 Type 2 diabetes mellitus with diabetic polyneuropathy: Secondary | ICD-10-CM | POA: Diagnosis not present

## 2019-04-14 DIAGNOSIS — E1142 Type 2 diabetes mellitus with diabetic polyneuropathy: Secondary | ICD-10-CM | POA: Diagnosis not present

## 2019-04-15 DIAGNOSIS — E1142 Type 2 diabetes mellitus with diabetic polyneuropathy: Secondary | ICD-10-CM | POA: Diagnosis not present

## 2019-04-16 DIAGNOSIS — E1142 Type 2 diabetes mellitus with diabetic polyneuropathy: Secondary | ICD-10-CM | POA: Diagnosis not present

## 2019-04-17 DIAGNOSIS — E1142 Type 2 diabetes mellitus with diabetic polyneuropathy: Secondary | ICD-10-CM | POA: Diagnosis not present

## 2019-04-20 DIAGNOSIS — E1142 Type 2 diabetes mellitus with diabetic polyneuropathy: Secondary | ICD-10-CM | POA: Diagnosis not present

## 2019-04-21 DIAGNOSIS — E1142 Type 2 diabetes mellitus with diabetic polyneuropathy: Secondary | ICD-10-CM | POA: Diagnosis not present

## 2019-04-22 DIAGNOSIS — E1142 Type 2 diabetes mellitus with diabetic polyneuropathy: Secondary | ICD-10-CM | POA: Diagnosis not present

## 2019-04-23 DIAGNOSIS — E1142 Type 2 diabetes mellitus with diabetic polyneuropathy: Secondary | ICD-10-CM | POA: Diagnosis not present

## 2019-04-24 DIAGNOSIS — E1142 Type 2 diabetes mellitus with diabetic polyneuropathy: Secondary | ICD-10-CM | POA: Diagnosis not present

## 2019-04-27 DIAGNOSIS — E1142 Type 2 diabetes mellitus with diabetic polyneuropathy: Secondary | ICD-10-CM | POA: Diagnosis not present

## 2019-04-28 DIAGNOSIS — E1142 Type 2 diabetes mellitus with diabetic polyneuropathy: Secondary | ICD-10-CM | POA: Diagnosis not present

## 2019-04-29 DIAGNOSIS — E1142 Type 2 diabetes mellitus with diabetic polyneuropathy: Secondary | ICD-10-CM | POA: Diagnosis not present

## 2019-04-30 DIAGNOSIS — E1142 Type 2 diabetes mellitus with diabetic polyneuropathy: Secondary | ICD-10-CM | POA: Diagnosis not present

## 2019-05-01 DIAGNOSIS — E1142 Type 2 diabetes mellitus with diabetic polyneuropathy: Secondary | ICD-10-CM | POA: Diagnosis not present

## 2019-05-04 DIAGNOSIS — E1142 Type 2 diabetes mellitus with diabetic polyneuropathy: Secondary | ICD-10-CM | POA: Diagnosis not present

## 2019-05-05 DIAGNOSIS — E1142 Type 2 diabetes mellitus with diabetic polyneuropathy: Secondary | ICD-10-CM | POA: Diagnosis not present

## 2019-05-06 DIAGNOSIS — E1142 Type 2 diabetes mellitus with diabetic polyneuropathy: Secondary | ICD-10-CM | POA: Diagnosis not present

## 2019-05-07 DIAGNOSIS — E1142 Type 2 diabetes mellitus with diabetic polyneuropathy: Secondary | ICD-10-CM | POA: Diagnosis not present

## 2019-05-08 DIAGNOSIS — E1142 Type 2 diabetes mellitus with diabetic polyneuropathy: Secondary | ICD-10-CM | POA: Diagnosis not present

## 2019-05-11 DIAGNOSIS — E1142 Type 2 diabetes mellitus with diabetic polyneuropathy: Secondary | ICD-10-CM | POA: Diagnosis not present

## 2019-05-12 DIAGNOSIS — E1142 Type 2 diabetes mellitus with diabetic polyneuropathy: Secondary | ICD-10-CM | POA: Diagnosis not present

## 2019-05-13 DIAGNOSIS — E1142 Type 2 diabetes mellitus with diabetic polyneuropathy: Secondary | ICD-10-CM | POA: Diagnosis not present

## 2019-05-14 DIAGNOSIS — E1142 Type 2 diabetes mellitus with diabetic polyneuropathy: Secondary | ICD-10-CM | POA: Diagnosis not present

## 2019-05-15 DIAGNOSIS — E1142 Type 2 diabetes mellitus with diabetic polyneuropathy: Secondary | ICD-10-CM | POA: Diagnosis not present

## 2019-05-18 DIAGNOSIS — E1142 Type 2 diabetes mellitus with diabetic polyneuropathy: Secondary | ICD-10-CM | POA: Diagnosis not present

## 2019-05-19 DIAGNOSIS — E1142 Type 2 diabetes mellitus with diabetic polyneuropathy: Secondary | ICD-10-CM | POA: Diagnosis not present

## 2019-05-20 ENCOUNTER — Other Ambulatory Visit: Payer: Self-pay | Admitting: Internal Medicine

## 2019-05-20 DIAGNOSIS — E11311 Type 2 diabetes mellitus with unspecified diabetic retinopathy with macular edema: Secondary | ICD-10-CM

## 2019-05-20 DIAGNOSIS — E1165 Type 2 diabetes mellitus with hyperglycemia: Secondary | ICD-10-CM

## 2019-05-20 DIAGNOSIS — I10 Essential (primary) hypertension: Secondary | ICD-10-CM

## 2019-05-20 DIAGNOSIS — IMO0002 Reserved for concepts with insufficient information to code with codable children: Secondary | ICD-10-CM

## 2019-05-20 DIAGNOSIS — E1142 Type 2 diabetes mellitus with diabetic polyneuropathy: Secondary | ICD-10-CM | POA: Diagnosis not present

## 2019-05-21 DIAGNOSIS — E1142 Type 2 diabetes mellitus with diabetic polyneuropathy: Secondary | ICD-10-CM | POA: Diagnosis not present

## 2019-05-22 DIAGNOSIS — E1142 Type 2 diabetes mellitus with diabetic polyneuropathy: Secondary | ICD-10-CM | POA: Diagnosis not present

## 2019-05-25 ENCOUNTER — Other Ambulatory Visit: Payer: Self-pay

## 2019-05-25 ENCOUNTER — Encounter: Payer: Self-pay | Admitting: Internal Medicine

## 2019-05-25 ENCOUNTER — Ambulatory Visit: Payer: Medicaid Other | Attending: Internal Medicine | Admitting: Internal Medicine

## 2019-05-25 VITALS — BP 169/83 | HR 96 | Temp 97.9°F | Resp 16 | Wt 233.6 lb

## 2019-05-25 DIAGNOSIS — Z791 Long term (current) use of non-steroidal anti-inflammatories (NSAID): Secondary | ICD-10-CM | POA: Insufficient documentation

## 2019-05-25 DIAGNOSIS — Z833 Family history of diabetes mellitus: Secondary | ICD-10-CM | POA: Insufficient documentation

## 2019-05-25 DIAGNOSIS — E785 Hyperlipidemia, unspecified: Secondary | ICD-10-CM | POA: Diagnosis not present

## 2019-05-25 DIAGNOSIS — R0789 Other chest pain: Secondary | ICD-10-CM

## 2019-05-25 DIAGNOSIS — Z1211 Encounter for screening for malignant neoplasm of colon: Secondary | ICD-10-CM

## 2019-05-25 DIAGNOSIS — H548 Legal blindness, as defined in USA: Secondary | ICD-10-CM | POA: Insufficient documentation

## 2019-05-25 DIAGNOSIS — R600 Localized edema: Secondary | ICD-10-CM | POA: Insufficient documentation

## 2019-05-25 DIAGNOSIS — E11319 Type 2 diabetes mellitus with unspecified diabetic retinopathy without macular edema: Secondary | ICD-10-CM | POA: Diagnosis not present

## 2019-05-25 DIAGNOSIS — E1142 Type 2 diabetes mellitus with diabetic polyneuropathy: Secondary | ICD-10-CM | POA: Insufficient documentation

## 2019-05-25 DIAGNOSIS — E118 Type 2 diabetes mellitus with unspecified complications: Secondary | ICD-10-CM | POA: Diagnosis not present

## 2019-05-25 DIAGNOSIS — I10 Essential (primary) hypertension: Secondary | ICD-10-CM

## 2019-05-25 DIAGNOSIS — E1169 Type 2 diabetes mellitus with other specified complication: Secondary | ICD-10-CM | POA: Diagnosis not present

## 2019-05-25 DIAGNOSIS — D649 Anemia, unspecified: Secondary | ICD-10-CM | POA: Diagnosis not present

## 2019-05-25 DIAGNOSIS — Z7984 Long term (current) use of oral hypoglycemic drugs: Secondary | ICD-10-CM | POA: Insufficient documentation

## 2019-05-25 DIAGNOSIS — R079 Chest pain, unspecified: Secondary | ICD-10-CM | POA: Insufficient documentation

## 2019-05-25 DIAGNOSIS — R0602 Shortness of breath: Secondary | ICD-10-CM | POA: Insufficient documentation

## 2019-05-25 DIAGNOSIS — Z79899 Other long term (current) drug therapy: Secondary | ICD-10-CM | POA: Insufficient documentation

## 2019-05-25 DIAGNOSIS — Z8249 Family history of ischemic heart disease and other diseases of the circulatory system: Secondary | ICD-10-CM | POA: Insufficient documentation

## 2019-05-25 LAB — POCT GLYCOSYLATED HEMOGLOBIN (HGB A1C): HbA1c, POC (controlled diabetic range): 7.8 % — AB (ref 0.0–7.0)

## 2019-05-25 LAB — GLUCOSE, POCT (MANUAL RESULT ENTRY): POC Glucose: 157 mg/dl — AB (ref 70–99)

## 2019-05-25 MED ORDER — METFORMIN HCL 1000 MG PO TABS: ORAL_TABLET | ORAL | 6 refills | Status: DC

## 2019-05-25 MED ORDER — LISINOPRIL 30 MG PO TABS: 30.0000 mg | ORAL_TABLET | Freq: Every day | ORAL | 6 refills | Status: DC

## 2019-05-25 MED ORDER — FUROSEMIDE 20 MG PO TABS: 20.0000 mg | ORAL_TABLET | Freq: Every day | ORAL | 6 refills | Status: DC

## 2019-05-25 MED ORDER — ATORVASTATIN CALCIUM 20 MG PO TABS: 20.0000 mg | ORAL_TABLET | Freq: Every day | ORAL | 6 refills | Status: DC

## 2019-05-25 MED ORDER — GLIMEPIRIDE 4 MG PO TABS: 4.0000 mg | ORAL_TABLET | Freq: Two times a day (BID) | ORAL | 6 refills | Status: DC

## 2019-05-25 MED ORDER — CARVEDILOL 3.125 MG PO TABS: 3.1250 mg | ORAL_TABLET | Freq: Two times a day (BID) | ORAL | 6 refills | Status: DC

## 2019-05-25 NOTE — Progress Notes (Signed)
Patient ID: Lisa Olson, female    DOB: 1960/10/14  MRN: [id number]  CC: Diabetes and Hypertension   Subjective: Lisa Olson is a 59 y.o. female who presents for chronic ds management Her concerns today include:  Patient with history ofDMtype II with associated peripheral neuropathy and retinopathy,legally blind in both eyes, HTN, HL.   DIABETES TYPE 2 Last A1C:  7.8 today  Med Adherence:  [x]  Yes-on Metformin and Amaryl 4 mg in the morning and 2 mg in the afternoon.   []  No Medication side effects:  []  Yes    [x]  No Home Monitoring?  [x]  Yes 3-4 x a day but did not bring log or glucometer Home glucose results range: in mornings in the 140, afternoons 180-200 Diet Adherence: [x]  Yes  -does a protein shake in the mornings for breakfast Exercise: [x]  Yes - walks QOD in her neighborhood QOD for 5-15 mins    []  No Hypoglycemic episodes?: []  Yes    [x]  No Numbness of the feet? [x]  Yes    []  No Retinopathy hx? [x]  Yes    []  No Last eye exam:  Comments:   Reports cramps in legs at nights LT>RT.  Not on statin.  No cramps or pain in the legs with walking.  HYPERTENSION Currently taking: see medication list -taking HCTZ and Lisinopril.  Not taking the Amlodipine; reportedly ran out of it.  Med Adherence: [x]  Yes    []  No Medication side effects: []  Yes    [x]  No Adherence with salt restriction: [x]  Yes -she has been trying to do so    []  No Home Monitoring?: [x]  Yes    []  No Monitoring Frequency: daily Home BP results range: 135-155/83-90 SOB? [x]  Yes with exertion    []  No Chest Pain?: [x]  Yes around LT breast with exercise.  No radiation down arm or up neck.  Several episodes also occurred while at rest.  Last echo done 08/2018 revealed normal LV function and cavity size.  There was mild sclerosis of the aortic valve without stenosis.  There was mild mitral annular calcification present without mitral valve stenosis. Leg swelling?: [x]  Yes - wears compression socks Headaches?: []  Yes     [x]  No Dizziness? []  Yes    [x]  No Comments:    Anemia: She has a stable anemia with iron studies in the past suggesting anemia of chronic disease.  Denies any dizziness.  Overdue for colon cancer screening.  HM:  Due for colon cancer screen and pap.  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  Patient Active Problem List   Diagnosis Date Noted  . Influenza vaccination declined 11/27/2018  . 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 Legal blindness 07/25/2017  . Type 2 diabetes mellitus with retinopathy of both eyes, with long-term current use of insulin ([place]) 02/14/2016  . Essential hypertension 02/14/2016  . Onychomycosis due to dermatophyte 03/25/2013  . Hallux valgus, acquired 03/25/2013     Current Outpatient Medications on File Prior to Visit  Medication Sig Dispense Refill  . Accu-Chek Softclix Lancets lancets Use as instructed 100 each 12  . amLODipine (NORVASC) 5 MG tablet Take 1 tablet (5 mg total) by mouth daily. 30 tablet 6  . Blood Glucose Monitoring Suppl (ACCU-CHEK AVIVA PLUS) w/Device KIT Use as directed 1 kit 0  . Calcium 600-400 MG-UNIT CHEW Chew 1 tablet by mouth daily. [place]  tablet 2  . diclofenac sodium (VOLTAREN) 1 % GEL Apply 2 g topically 4 (four) times daily. 100 g 0  . ferrous sulfate 325 (65 FE) MG tablet Take 1 tablet (325 mg total) by mouth daily with breakfast. 100 tablet 3  . glucose blood test strip USE 1 STRIP TO CHECK GLUCOSE THREE TIMES DAILY AS DIRECTED 100 each 12  . hydrochlorothiazide (HYDRODIURIL) 25 MG tablet Take 1 tablet (25 mg total) by mouth daily. 30 tablet 6  . hydrOXYzine (ATARAX/VISTARIL) 10 MG tablet Take 1 tablet (10 mg total) by mouth 3 (three) times daily as needed for itching or anxiety. 30 tablet 0  . Lancet Devices (ACCU-CHEK SOFTCLIX) lancets Use as instructed 1 each 0  . Multiple  Vitamins-Minerals (MULTIVITAMIN WITH MINERALS) tablet Take 1 tablet by mouth daily.    Marland Kitchen terbinafine (LAMISIL) 250 MG tablet Take 250 mg by mouth daily.  0   No current facility-administered medications on file prior to visit.    No Known Allergies  Social History   Socioeconomic History  . Marital status: Single    Spouse name: Not on file  . Number of children: Not on file  . Years of education: Not on file  . Highest education level: Not on file  Occupational History  . Not on file  Tobacco Use  . Smoking status: Never Smoker  . Smokeless tobacco: Never Used  Substance and Sexual Activity  . Alcohol use: Not on file  . Drug use: No  . Sexual activity: Not on file  Other Topics Concern  . Not on file  Social History Narrative  . Not on file   Social Determinants of Health   Financial Resource Strain:   . Difficulty of Paying Living Expenses: Not on file  Food Insecurity:   . Worried About Charity fundraiser in the Last Year: Not on file  . Ran Out of Food in the Last Year: Not on file  Transportation Needs:   . Lack of Transportation (Medical): Not on file  . Lack of Transportation (Non-Medical): Not on file  Physical Activity:   . Days of Exercise per Week: Not on file  . Minutes of Exercise per Session: Not on file  Stress:   . Feeling of Stress : Not on file  Social Connections:   . Frequency of Communication with Friends and Family: Not on file  . Frequency of Social Gatherings with Friends and Family: Not on file  . Attends Religious Services: Not on file  . Active Member of Clubs or Organizations: Not on file  . Attends Archivist Meetings: Not on file  . Marital Status: Not on file  Intimate Partner Violence:   . Fear of Current or Ex-Partner: Not on file  . Emotionally Abused: Not on file  . Physically Abused: Not on file  . Sexually Abused: Not on file    Family History  Problem Relation Age of Onset  . Diabetes Mother   . Diabetes  Father   . Hypertension Sister     Past Surgical History:  Procedure Laterality Date  . EYE SURGERY    . LASIK    . RETINAL DETACHMENT SURGERY      ROS: Review of Systems Negative except as stated above  PHYSICAL EXAM: BP (!) 169/83   Pulse 96   Temp 97.9 F (36.6 C)   Resp 16   Wt 233 lb 9.6 oz (106 kg)   SpO2 96%   BMI 35.52  kg/m   Wt Readings from Last 3 Encounters:  05/25/19 233 lb 9.6 oz (106 kg)  05/22/18 232 lb 9.6 oz (105.5 kg)  02/20/18 227 lb 9.6 oz (103.2 kg)    Physical Exam General appearance - alert, well appearing, obese older female and in no distress Mental status - normal mood, behavior, speech, dress, motor activity, and thought processes Neck - supple, no significant adenopathy Chest - clear to auscultation, no wheezes, rales or rhonchi, symmetric air entry Heart - normal rate, regular rhythm, normal S1, S2, no murmurs, rubs, clicks or gallops.  No JVD Extremities -1+ bilateral lower extremity edema Diabetic Foot Exam - Simple   Simple Foot Form Visual Inspection No deformities, no ulcerations, no other skin breakdown bilaterally: Yes Sensation Testing Intact to touch and monofilament testing bilaterally: Yes Pulse Check See comments: Yes Comments DP and PT pulses 2+ BL      CMP Latest Ref Rng & Units 05/25/2019 06/03/2018 07/25/2017  Glucose 65 - 99 mg/dL 122(H) 202(H) 112(H)  BUN 8 - 27 mg/dL 18 23 14   Creatinine 0.57 - 1.00 mg/dL 0.91 0.88 0.72  Sodium 134 - 144 mmol/L 138 136 140  Potassium 3.5 - 5.2 mmol/L 4.2 4.4 4.1  Chloride 96 - 106 mmol/L 100 99 101  CO2 20 - 29 mmol/L 28 21 26   Calcium 8.7 - 10.3 mg/dL 9.1 9.0 9.3  Total Protein 6.0 - 8.5 g/dL 7.1 6.7 7.3  Total Bilirubin 0.0 - 1.2 mg/dL 0.3 0.3 <0.2  Alkaline Phos 39 - 117 IU/L 70 70 76  AST 0 - 40 IU/L 17 20 15   ALT 0 - 32 IU/L 14 17 14    Lipid Panel     Component Value Date/Time   CHOL 175 05/25/2019 1530   TRIG 211 (H) 05/25/2019 1530   HDL 42 05/25/2019 1530    CHOLHDL 4.2 05/25/2019 1530   CHOLHDL 4.7 05/14/2016 1607   VLDL 44 (H) 05/14/2016 1607   LDLCALC 97 05/25/2019 1530    CBC    Component Value Date/Time   WBC 5.5 05/25/2019 1530   WBC SEE NOTE 02/13/2016 1746   RBC 4.20 05/25/2019 1530   RBC CANCELED 02/13/2016 1746   HGB 10.8 (L) 05/25/2019 1530   HCT 33.3 (L) 05/25/2019 1530   PLT 270 05/25/2019 1530   MCV 79 05/25/2019 1530   MCH 25.7 (L) 05/25/2019 1530   MCH CANCELED 02/13/2016 1746   MCHC 32.4 05/25/2019 1530   MCHC CANCELED 02/13/2016 1746   RDW 12.8 05/25/2019 1530   LYMPHSABS CANCELED 02/13/2016 1746   MONOABS CANCELED 02/13/2016 1746   EOSABS CANCELED 02/13/2016 1746   BASOSABS CANCELED 02/13/2016 1746   EKG: Normal sinus rhythm with some T wave abnormalities in the inferior leads.  No old EKG to compare. ASSESSMENT AND PLAN:  1. Controlled type 2 diabetes mellitus with complication, without long-term current use of insulin (HCC) A1c slightly increased compared to last A1c of 7.3.  She will continue Metformin.  Increase Amaryl to 4 mg twice a day.  Discussed and encourage healthy eating habits. - POCT glucose (manual entry) - POCT glycosylated hemoglobin (Hb A1C) - glimepiride (AMARYL) 4 MG tablet; Take 1 tablet (4 mg total) by mouth 2 (two) times daily.  Dispense: 60 tablet; Refill: 6 - metFORMIN (GLUCOPHAGE) 1000 MG tablet; TAKE 1 TABLET BY MOUTH TWICE DAILY WITH A MEAL .  Dispense: 60 tablet; Refill: 6 - Comprehensive metabolic panel - CBC - Lipid panel  2. Essential hypertension Not at  goal.  Continue lisinopril. Discontinue Norvasc which he has been out of.  I do not wish to restart this due to lower extremity edema that she currently has.  Start carvedilol 3.25 mg twice a day instead Stop hydrochlorothiazide.  Change to furosemide 20 mg instead. - lisinopril (ZESTRIL) 30 MG tablet; Take 1 tablet (30 mg total) by mouth daily.  Dispense: 30 tablet; Refill: 6 - furosemide (LASIX) 20 MG tablet; Take 1 tablet  (20 mg total) by mouth daily. Stop Hydrochlorothiazide  Dispense: 30 tablet; Refill: 6 - carvedilol (COREG) 3.125 MG tablet; Take 1 tablet (3.125 mg total) by mouth 2 (two) times daily with a meal.  Dispense: 60 tablet; Refill: 6  3. Hyperlipidemia due to type 2 diabetes mellitus (HCC) - atorvastatin (LIPITOR) 20 MG tablet; Take 1 tablet (20 mg total) by mouth daily.  Dispense: 30 tablet; Refill: 6  4. Screening for colon cancer Discussed colon cancer screening.  She is willing to do the Cologuard test.  Order placed. - Cologuard  5. Chest pain in adult Patient with chest pain on exertion.  She will hold on her exercise routine for now until she sees cardiology.  Recommend taking a baby aspirin daily but patient states that aspirin causes her to bleed behind the eyes. - Ambulatory referral to Cardiology - EKG 12-Lead  6. SOB (shortness of breath) - Brain natriuretic peptide - Ambulatory referral to Cardiology  7. Normocytic anemia This has been stable.  We will recheck CBC today along with iron studies. - Iron, TIBC and Ferritin Panel  8. Diabetic retinopathy of both eyes associated with type 2 diabetes mellitus, macular edema presence unspecified, unspecified retinopathy severity ([place]) Patient followed by specialist.  Patient was given the opportunity to ask questions.  Patient verbalized understanding of the plan and was able to repeat key elements of the plan.   Orders Placed This Encounter  Procedures  . Comprehensive metabolic panel  . CBC  . Lipid panel  . Iron, TIBC and Ferritin Panel  . Cologuard  . Brain natriuretic peptide  . Ambulatory referral to Cardiology  . POCT glucose (manual entry)  . POCT glycosylated hemoglobin (Hb A1C)  . EKG 12-Lead     Requested Prescriptions   Signed Prescriptions Disp Refills  . atorvastatin (LIPITOR) 20 MG tablet 30 tablet 6    Sig: Take 1 tablet (20 mg total) by mouth daily.  Marland Kitchen glimepiride (AMARYL) 4 MG tablet 60 tablet 6     Sig: Take 1 tablet (4 mg total) by mouth 2 (two) times daily.  Marland Kitchen lisinopril (ZESTRIL) 30 MG tablet 30 tablet 6    Sig: Take 1 tablet (30 mg total) by mouth daily.  . metFORMIN (GLUCOPHAGE) 1000 MG tablet 60 tablet 6    Sig: TAKE 1 TABLET BY MOUTH TWICE DAILY WITH A MEAL .  . furosemide (LASIX) 20 MG tablet 30 tablet 6    Sig: Take 1 tablet (20 mg total) by mouth daily. Stop Hydrochlorothiazide  . carvedilol (COREG) 3.125 MG tablet 60 tablet 6    Sig: Take 1 tablet (3.125 mg total) by mouth 2 (two) times daily with a meal.    Return in about 6 weeks (around 07/06/2019) for PAP smear.  Karle Plumber, MD, FACP

## 2019-05-25 NOTE — Patient Instructions (Addendum)
Stop hydrochlorothiazide.  We have changed you to a different medication called furosemide 20 mg daily.  Continue lisinopril.  We have added a new blood pressure medicine called carvedilol which she will take twice a day.  I have referred you to the cardiologist.

## 2019-05-26 DIAGNOSIS — E1142 Type 2 diabetes mellitus with diabetic polyneuropathy: Secondary | ICD-10-CM | POA: Diagnosis not present

## 2019-05-26 LAB — CBC
Hematocrit: 33.3 % — ABNORMAL LOW (ref 34.0–46.6)
Hemoglobin: 10.8 g/dL — ABNORMAL LOW (ref 11.1–15.9)
MCH: 25.7 pg — ABNORMAL LOW (ref 26.6–33.0)
MCHC: 32.4 g/dL (ref 31.5–35.7)
MCV: 79 fL (ref 79–97)
Platelets: 270 10*3/uL (ref 150–450)
RBC: 4.2 x10E6/uL (ref 3.77–5.28)
RDW: 12.8 % (ref 11.7–15.4)
WBC: 5.5 10*3/uL (ref 3.4–10.8)

## 2019-05-26 LAB — LIPID PANEL
Chol/HDL Ratio: 4.2 ratio (ref 0.0–4.4)
Cholesterol, Total: 175 mg/dL (ref 100–199)
HDL: 42 mg/dL (ref >39)
LDL Chol Calc (NIH): 97 mg/dL (ref 0–99)
Triglycerides: 211 mg/dL — ABNORMAL HIGH (ref 0–149)
VLDL Cholesterol Cal: 36 mg/dL (ref 5–40)

## 2019-05-26 LAB — COMPREHENSIVE METABOLIC PANEL
ALT: 14 IU/L (ref 0–32)
AST: 17 IU/L (ref 0–40)
Albumin/Globulin Ratio: 1.4 (ref 1.2–2.2)
Albumin: 4.2 g/dL (ref 3.8–4.9)
Alkaline Phosphatase: 70 IU/L (ref 39–117)
BUN/Creatinine Ratio: 20 (ref 12–28)
BUN: 18 mg/dL (ref 8–27)
Bilirubin Total: 0.3 mg/dL (ref 0.0–1.2)
CO2: 28 mmol/L (ref 20–29)
Calcium: 9.1 mg/dL (ref 8.7–10.3)
Chloride: 100 mmol/L (ref 96–106)
Creatinine, Ser: 0.91 mg/dL (ref 0.57–1.00)
GFR calc Af Amer: 79 mL/min/{1.73_m2} (ref >59)
GFR calc non Af Amer: 69 mL/min/{1.73_m2} (ref >59)
Globulin, Total: 2.9 g/dL (ref 1.5–4.5)
Glucose: 122 mg/dL — ABNORMAL HIGH (ref 65–99)
Potassium: 4.2 mmol/L (ref 3.5–5.2)
Sodium: 138 mmol/L (ref 134–144)
Total Protein: 7.1 g/dL (ref 6.0–8.5)

## 2019-05-26 LAB — BRAIN NATRIURETIC PEPTIDE: BNP: 14.7 pg/mL (ref 0.0–100.0)

## 2019-05-26 LAB — IRON,TIBC AND FERRITIN PANEL
Ferritin: 269 ng/mL — ABNORMAL HIGH (ref 15–150)
Iron Saturation: 21 % (ref 15–55)
Iron: 59 ug/dL (ref 27–159)
Total Iron Binding Capacity: 278 ug/dL (ref 250–450)
UIBC: 219 ug/dL (ref 131–425)

## 2019-05-27 DIAGNOSIS — E1142 Type 2 diabetes mellitus with diabetic polyneuropathy: Secondary | ICD-10-CM | POA: Diagnosis not present

## 2019-05-27 NOTE — Progress Notes (Signed)
Let patient know that her kidney and liver function tests are good.  Her blood count revealed a mild but stable anemia.  LDL cholesterol is 97 with goal being less than 70 in patients with diabetes.  Please make sure that she is taking the atorvastatin which is the medication to help lower cholesterol.

## 2019-05-28 DIAGNOSIS — E1142 Type 2 diabetes mellitus with diabetic polyneuropathy: Secondary | ICD-10-CM | POA: Diagnosis not present

## 2019-05-29 DIAGNOSIS — E1142 Type 2 diabetes mellitus with diabetic polyneuropathy: Secondary | ICD-10-CM | POA: Diagnosis not present

## 2019-06-01 DIAGNOSIS — E1142 Type 2 diabetes mellitus with diabetic polyneuropathy: Secondary | ICD-10-CM | POA: Diagnosis not present

## 2019-06-02 DIAGNOSIS — E1142 Type 2 diabetes mellitus with diabetic polyneuropathy: Secondary | ICD-10-CM | POA: Diagnosis not present

## 2019-06-03 ENCOUNTER — Encounter: Payer: Self-pay | Admitting: Cardiology

## 2019-06-03 ENCOUNTER — Other Ambulatory Visit: Payer: Self-pay

## 2019-06-03 ENCOUNTER — Ambulatory Visit (INDEPENDENT_AMBULATORY_CARE_PROVIDER_SITE_OTHER): Payer: Medicaid Other | Admitting: Cardiology

## 2019-06-03 VITALS — BP 122/78 | HR 87 | Temp 97.2°F | Ht 68.0 in | Wt 232.6 lb

## 2019-06-03 DIAGNOSIS — Z7189 Other specified counseling: Secondary | ICD-10-CM | POA: Diagnosis not present

## 2019-06-03 DIAGNOSIS — E1142 Type 2 diabetes mellitus with diabetic polyneuropathy: Secondary | ICD-10-CM | POA: Diagnosis not present

## 2019-06-03 DIAGNOSIS — E118 Type 2 diabetes mellitus with unspecified complications: Secondary | ICD-10-CM

## 2019-06-03 DIAGNOSIS — I10 Essential (primary) hypertension: Secondary | ICD-10-CM

## 2019-06-03 DIAGNOSIS — E782 Mixed hyperlipidemia: Secondary | ICD-10-CM | POA: Diagnosis not present

## 2019-06-03 DIAGNOSIS — R0609 Other forms of dyspnea: Secondary | ICD-10-CM

## 2019-06-03 DIAGNOSIS — R079 Chest pain, unspecified: Secondary | ICD-10-CM

## 2019-06-03 DIAGNOSIS — R0600 Dyspnea, unspecified: Secondary | ICD-10-CM | POA: Diagnosis not present

## 2019-06-03 NOTE — Patient Instructions (Signed)
Medication Instructions:  Your Physician recommend you continue on your current medication as directed.    *If you need a refill on your cardiac medications before your next appointment, please call your pharmacy*   Lab Work: None   Testing/Procedures: Your physician has requested that you have a lexiscan myoview. For further information please visit [email or web address] Please follow instruction sheet, as given. [address]    Follow-Up: At [facility], you and your health needs are our priority.  As part of our continuing mission to provide you with exceptional heart care, we have created designated Provider Care Teams.  These Care Teams include your primary Cardiologist (physician) and Advanced Practice Providers (APPs -  Physician Assistants and Nurse Practitioners) who all work together to provide you with the care you need, when you need it.  We recommend signing up for the patient portal called "MyChart".  Sign up information is provided on this After Visit Summary.  MyChart is used to connect with patients for Virtual Visits (Telemedicine).  Patients are able to view lab/test results, encounter notes, upcoming appointments, etc.  Non-urgent messages can be sent to your provider as well.   To learn more about what you can do with MyChart, go to [email or web address].    Your next appointment:   1 year(s)  The format for your next appointment:   In Person  Provider:   Jodelle Red, MD  You are scheduled for a Myocardial Perfusion Imaging Study.  Please arrive 15 minutes prior to your appointment time for registration and insurance purposes.  The test will take approximately 3 to 4 hours to complete; you may bring reading material.  If someone comes with you to your appointment, they will need to remain in the main lobby due to limited space in the testing area. **If you are pregnant or breastfeeding, please notify the nuclear lab prior to your  appointment**  How to prepare for your Myocardial Perfusion Test: . Do not eat or drink 3 hours prior to your test, except you may have water. . Do not consume products containing caffeine (regular or decaffeinated) 12 hours prior to your test. (ex: coffee, chocolate, sodas, tea). . Do bring a list of your current medications with you.  If not listed below, you may take your medications as normal. . Do wear comfortable clothes (no dresses or overalls) and walking shoes, tennis shoes preferred (No heels or open toe shoes are allowed). . Do NOT wear cologne, perfume, aftershave, or lotions (deodorant is allowed). . If these instructions are not followed, your test will have to be rescheduled.  Please report to [address] [facility], [address] for your test.  If you have questions or concerns about your appointment, you can call the Nuclear Lab at [phone].  If you cannot keep your appointment, please provide 24 hours notification to the Nuclear Lab, to avoid a possible $50 charge to your account.

## 2019-06-03 NOTE — Progress Notes (Signed)
Cardiology Office Note:    Date:  06/03/2019   ID:  Lisa Olson, DOB 01-13-1961, MRN [id number]  PCP:  [name], MD  Cardiologist:  Buford Dresser, MD  Referring MD: [name], MD   CC: new patient evaluation for chest pain and shortness of breath  History of Present Illness:    Lisa Olson is a 59 y.o. 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  Note from 05/25/19 with Dr. Wynetta Emery reviewed. Noted to have pain around her left breast with exercise, no radiation to arms, occasionally occurs at rest. Noted to have shortness of breath as well, BNP normal. Echo 09/04/18 reviewed.  Today:  She does not want to describe her chest discomfort as pain. Noticed under both right and left breast, as well as her back. Can be a discomfort like pins and needles or tingling/crawling sensation. Sometimes feels heavy in her back, moves to the other side of her back. Has been going on for two years. Varies in frequency, intensity; she has difficulty quantifying. Comes and goes but also present all the time. Eating makes it better or worse. Gets some shortness of breath with activity but does not seem to change her discomfort.  -Prior cardiac history: none that she knows of -Prior ECG: NSR, nonspecific t wave changes -Prior workup: echo 08/2018 reviewed -Prior treatment: none -Alcohol: none -Tobacco: never -Comorbidities: diabetes, obesity, hypertension, hyperlipidemia -Exercise level: minimally active -Cardiac ROS: no shortness of breath except with exertion, no PND, no orthopnea, no LE edema, no syncope -Family history: none that she knows of, all lived a long time  Past Medical History:  Diagnosis Date  . Diabetes mellitus without complication ([place])   . Hypertension   . Legally blind   . 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 EYE SURGERY    . LASIK    . RETINAL DETACHMENT SURGERY      Current Medications: Current Outpatient Medications on File Prior to Visit  Medication Sig  . Accu-Chek Softclix Lancets lancets Use as instructed  . amLODipine (NORVASC) 5 MG tablet Take 1 tablet (5 mg total) by mouth daily.  Marland Kitchen atorvastatin (LIPITOR) 20 MG tablet Take 1 tablet (20 mg total) by mouth daily.  . Blood Glucose Monitoring Suppl (ACCU-CHEK AVIVA PLUS) w/Device KIT Use as directed  . Calcium 600-400 MG-UNIT CHEW Chew 1 tablet by mouth daily.  . carvedilol (COREG) 3.125 MG tablet Take 1 tablet (3.125 mg total) by mouth 2 (two) times daily with a meal.  . diclofenac sodium (VOLTAREN) 1 % GEL Apply 2 g topically 4 (four) times daily.  . ferrous sulfate 325 (65 FE) MG tablet Take 1 tablet (325 mg total) by mouth daily with breakfast.  . furosemide (LASIX) 20 MG tablet Take 1 tablet (20 mg total) by mouth daily. Stop Hydrochlorothiazide  . glimepiride (AMARYL) 4 MG tablet Take 1 tablet (4 mg total) by mouth 2 (two) times daily.  Marland Kitchen glucose blood test strip USE 1 STRIP TO CHECK GLUCOSE THREE TIMES DAILY AS DIRECTED  . Lancet Devices (ACCU-CHEK SOFTCLIX) lancets Use as instructed  . lisinopril (ZESTRIL) 30 MG tablet Take 1 tablet (30 mg total) by mouth daily.  . metFORMIN (GLUCOPHAGE) 1000 MG tablet TAKE 1 TABLET BY  MOUTH TWICE DAILY WITH A MEAL .  Marland Kitchen Multiple Vitamins-Minerals (MULTIVITAMIN WITH MINERALS) tablet Take 1 tablet by mouth daily.  . hydrochlorothiazide (HYDRODIURIL) 25 MG tablet Take 1 tablet (25 mg total) by mouth daily. (Patient not taking: Reported on 06/03/2019)  . hydrOXYzine (ATARAX/VISTARIL) 10 MG tablet Take 1 tablet (10 mg total) by mouth 3 (three) times daily as needed for itching or anxiety. (Patient not taking: Reported on 06/03/2019)  . terbinafine (LAMISIL) 250 MG tablet Take 250 mg by mouth daily.   No current  facility-administered medications on file prior to visit.     Allergies:   Patient has no known allergies.   Social History   Tobacco Use  . Smoking status: Never Smoker  . Smokeless tobacco: Never Used  Substance Use Topics  . Alcohol use: Not on file  . Drug use: No    Family History: family history includes Diabetes in her father and mother; Hypertension in her sister.  ROS:   Please see the history of present illness.  Additional pertinent ROS: Constitutional: Negative for chills, fever, night sweats, unintentional weight loss  HENT: Negative for ear pain and hearing loss.   Eyes: Negative for eye pain. Legally blind Respiratory: Negative for cough, sputum, wheezing.   Cardiovascular: See HPI. Gastrointestinal: Negative for abdominal pain, melena, and hematochezia.  Genitourinary: Negative for dysuria and hematuria.  Musculoskeletal: Negative for falls and myalgias.  Skin: Negative for itching and rash.  Neurological: Negative for focal weakness, focal sensory changes and loss of consciousness.  Endo/Heme/Allergies: Does not bruise/bleed easily.     EKGs/Labs/Other Studies Reviewed:    The following studies were reviewed today: Echo 09/04/18 1. The left ventricle has normal systolic function with an ejection  fraction of 60-65%. The cavity size was normal. There is mildly increased  left ventricular wall thickness. Left ventricular diastolic Doppler  parameters are consistent with impaired  relaxation. Indeterminate filling pressures The E/e' is 8-15.  2. The right ventricle has normal systolic function. The cavity was  normal. There is no increase in right ventricular wall thickness.  3. The mitral valve is abnormal. Mild thickening of the mitral valve  leaflet. There is mild mitral annular calcification present. No evidence  of mitral valve stenosis.  4. The tricuspid valve is grossly normal.  5. The aortic valve is abnormal. Mild sclerosis of the aortic  valve. No  stenosis of the aortic valve.  6. The aortic root and ascending aorta are normal in size and structure.   SUMMARY    Technically difficult study. Reduced echo windows. LVEF 60-65%, mild  LVH, normal wall motion, grade 1 DD, indeterminate LV filling  pressure, normal biatrial size, trivial MR, normal IVC, no  pericardial effusion.   EKG:  EKG is personally reviewed.  The ekg ordered today demonstrates NSR, nonspecific inferolateral t wave flattening/inversion  Recent Labs: 05/25/2019: ALT 14; BNP 14.7; BUN 18; Creatinine, Ser 0.91; Hemoglobin 10.8; Platelets 270; Potassium 4.2; Sodium 138  Recent Lipid Panel    Component Value Date/Time   CHOL 175 05/25/2019 1530   TRIG 211 (H) 05/25/2019 1530   HDL 42 05/25/2019 1530   CHOLHDL 4.2 05/25/2019 1530   CHOLHDL 4.7 05/14/2016 1607   VLDL 44 (H) 05/14/2016 1607   LDLCALC 97 05/25/2019 1530    Physical Exam:    VS:  BP 122/78 (BP Location: Right Arm)   Pulse 87   Temp (!) 97.2 F (36.2 C)   Ht 5' 8"  (1.727 m)   Wt  232 lb 9.6 oz (105.5 kg)   SpO2 96%   BMI 35.37 kg/m     Wt Readings from Last 3 Encounters:  06/03/19 232 lb 9.6 oz (105.5 kg)  05/25/19 233 lb 9.6 oz (106 kg)  05/22/18 232 lb 9.6 oz (105.5 kg)    GEN: Well nourished, well developed in no acute distress HEENT: Normal, moist mucous membranes NECK: No JVD visible CARDIAC: regular rhythm, normal S1 and S2, no rubs or gallops. No murmurs. VASCULAR: Radial pulses 2+ bilaterally. No carotid bruits. DP and PT pulses trace to 1+ bilaterally RESPIRATORY:  Clear to auscultation without rales, wheezing or rhonchi  ABDOMEN: Soft, non-tender, non-distended MUSCULOSKELETAL:  Moves all 4 limbs independently SKIN: Warm and dry, no edema NEUROLOGIC:  Alert and oriented x 3. No focal neuro deficits noted. PSYCHIATRIC:  Normal affect    ASSESSMENT:    1. Chest pain, unspecified type   2. Mixed hyperlipidemia   3. Essential hypertension   4. DOE (dyspnea on  exertion)   5. Type 2 diabetes mellitus with complication, without long-term current use of insulin (HCC)   6. Cardiac risk counseling   7. Counseling on health promotion and disease prevention    PLAN:    Nonspecific chest pain, dyspnea on exertion: atypical symptoms, but has diabetes with severe complications, so symptoms may not be reliable. Elevated risk score -discussed treadmill stress, nuclear stress/lexiscan, and CT coronary angiography. Discussed pros and cons of each, including but not limited to false positive/false negative risk, radiation risk, and risk of IV contrast dye. Based on shared decision making, decision was made to pursue chemical nuclear stress test. She is blind and cannot treadmill.  Mixed hyperlipidemia: Ldl 97, TG 211 -continue atorvastatin 20 mg daily, though she notes she does "take breaks" from it intermittently. We discussed that ideally taking daily provides the most protection.  Hypertension: goal <130/80 -well controlled today -continue amlodipine 59m, carvedilol 3.125 mg BID, lisinopril 30 mg daily  Type II diabetes: A1c 7.8, on metformin and glimeperide. Not on insulin -complicated by neuropathy and retinopathy, legally blind  Cardiac risk counseling and prevention recommendations: -recommend heart healthy/Mediterranean diet, with whole grains, fruits, vegetable, fish, lean meats, nuts, and olive oil. Limit salt. -recommend moderate walking, 3-5 times/week for 30-50 minutes each session. Aim for at least 150 minutes.week. Goal should be pace of 3 miles/hours, or walking 1.5 miles in 30 minutes. We discussed this may be difficult due to her legal blindness, but she wants to get back to safe exercise in the gym. -recommend avoidance of tobacco products. Avoid excess alcohol. -Additional risk factor control:  -Diabetes: as above  -Lipids: as above  -Blood pressure control: as above  -Weight: BMI 35 -ASCVD risk score: The 10-year ASCVD risk score (Mikey Bussing DC JBrooke Bonito, et al., 2013) is: 14.1%   Values used to calculate the score:     Age: [age]years     Sex: Female     Is Non-Hispanic African American: Yes     Diabetic: Yes     Tobacco smoker: No     Systolic Blood Pressure: 1532mmHg     Is BP treated: Yes     HDL Cholesterol: 42 mg/dL     Total Cholesterol: 175 mg/dL    Plan for follow up: if nuclear stress normal, follow up in 1 year for prevention and monitoring.  BBuford Dresser MD, PhD [place]  [facility]    Medication Adjustments/Labs and Tests Ordered: Current medicines are reviewed  at length with the patient today.  Concerns regarding medicines are outlined above.  Orders Placed This Encounter  Procedures  . MYOCARDIAL PERFUSION IMAGING  . EKG 12-Lead   No orders of the defined types were placed in this encounter.   Patient Instructions  Medication Instructions:  Your Physician recommend you continue on your current medication as directed.    *If you need a refill on your cardiac medications before your next appointment, please call your pharmacy*   Lab Work: None   Testing/Procedures: Your physician has requested that you have a lexiscan myoview. For further information please visit [email or web address]. Please follow instruction sheet, as given. [place]. [address]    Follow-Up: At [facility], you and your health needs are our priority.  As part of our continuing mission to provide you with exceptional heart care, we have created designated Provider Care Teams.  These Care Teams include your primary Cardiologist (physician) and Advanced Practice Providers (APPs -  Physician Assistants and Nurse Practitioners) who all work together to provide you with the care you need, when you need it.  We recommend signing up for the patient portal called "MyChart".  Sign up information is provided on this After Visit Summary.  MyChart is used to connect with patients for Virtual Visits (Telemedicine).   Patients are able to view lab/test results, encounter notes, upcoming appointments, etc.  Non-urgent messages can be sent to your provider as well.   To learn more about what you can do with MyChart, go to [email or web address].    Your next appointment:   1 year(s)  The format for your next appointment:   In Person  Provider:   Buford Dresser, MD  You are scheduled for a Myocardial Perfusion Imaging Study.  Please arrive 15 minutes prior to your appointment time for registration and insurance purposes.  The test will take approximately 3 to 4 hours to complete; you may bring reading material.  If someone comes with you to your appointment, they will need to remain in the main lobby due to limited space in the testing area. **If you are pregnant or breastfeeding, please notify the nuclear lab prior to your appointment**  How to prepare for your Myocardial Perfusion Test: . Do not eat or drink 3 hours prior to your test, except you may have water. . Do not consume products containing caffeine (regular or decaffeinated) 12 hours prior to your test. (ex: coffee, chocolate, sodas, tea). . Do bring a list of your current medications with you.  If not listed below, you may take your medications as normal. . Do wear comfortable clothes (no dresses or overalls) and walking shoes, tennis shoes preferred (No heels or open toe shoes are allowed). . Do NOT wear cologne, perfume, aftershave, or lotions (deodorant is allowed). . If these instructions are not followed, your test will have to be rescheduled.  Please report to [place], [address] for your test.  If you have questions or concerns about your appointment, you can call the Nuclear Lab at [phone].  If you cannot keep your appointment, please provide 24 hours notification to the Nuclear Lab, to avoid a possible $50 charge to your account.     Signed, Buford Dresser, MD PhD 06/03/2019 4:38 PM    [facility]

## 2019-06-04 DIAGNOSIS — E1142 Type 2 diabetes mellitus with diabetic polyneuropathy: Secondary | ICD-10-CM | POA: Diagnosis not present

## 2019-06-05 DIAGNOSIS — E1142 Type 2 diabetes mellitus with diabetic polyneuropathy: Secondary | ICD-10-CM | POA: Diagnosis not present

## 2019-06-08 DIAGNOSIS — E1142 Type 2 diabetes mellitus with diabetic polyneuropathy: Secondary | ICD-10-CM | POA: Diagnosis not present

## 2019-06-09 DIAGNOSIS — E1142 Type 2 diabetes mellitus with diabetic polyneuropathy: Secondary | ICD-10-CM | POA: Diagnosis not present

## 2019-06-10 DIAGNOSIS — E1142 Type 2 diabetes mellitus with diabetic polyneuropathy: Secondary | ICD-10-CM | POA: Diagnosis not present

## 2019-06-11 ENCOUNTER — Telehealth: Payer: Self-pay

## 2019-06-11 ENCOUNTER — Telehealth (HOSPITAL_COMMUNITY): Payer: Self-pay

## 2019-06-11 DIAGNOSIS — E1142 Type 2 diabetes mellitus with diabetic polyneuropathy: Secondary | ICD-10-CM | POA: Diagnosis not present

## 2019-06-11 NOTE — Telephone Encounter (Signed)
Spoke with pt who state she has some doubts about proceeding with lexiscan. Pt would like to discuss with MD. Appointment scheduled for 3/28.

## 2019-06-11 NOTE — Telephone Encounter (Signed)
Encounter complete. 

## 2019-06-12 DIAGNOSIS — E1142 Type 2 diabetes mellitus with diabetic polyneuropathy: Secondary | ICD-10-CM | POA: Diagnosis not present

## 2019-06-15 ENCOUNTER — Telehealth: Payer: Self-pay

## 2019-06-15 ENCOUNTER — Telehealth (INDEPENDENT_AMBULATORY_CARE_PROVIDER_SITE_OTHER): Payer: Medicaid Other | Admitting: Cardiology

## 2019-06-15 ENCOUNTER — Encounter: Payer: Self-pay | Admitting: Cardiology

## 2019-06-15 DIAGNOSIS — E118 Type 2 diabetes mellitus with unspecified complications: Secondary | ICD-10-CM

## 2019-06-15 DIAGNOSIS — E11319 Type 2 diabetes mellitus with unspecified diabetic retinopathy without macular edema: Secondary | ICD-10-CM

## 2019-06-15 DIAGNOSIS — E1140 Type 2 diabetes mellitus with diabetic neuropathy, unspecified: Secondary | ICD-10-CM

## 2019-06-15 DIAGNOSIS — H548 Legal blindness, as defined in USA: Secondary | ICD-10-CM | POA: Diagnosis not present

## 2019-06-15 DIAGNOSIS — R0600 Dyspnea, unspecified: Secondary | ICD-10-CM

## 2019-06-15 DIAGNOSIS — Z7984 Long term (current) use of oral hypoglycemic drugs: Secondary | ICD-10-CM

## 2019-06-15 DIAGNOSIS — R079 Chest pain, unspecified: Secondary | ICD-10-CM | POA: Diagnosis not present

## 2019-06-15 DIAGNOSIS — Z7189 Other specified counseling: Secondary | ICD-10-CM

## 2019-06-15 DIAGNOSIS — E782 Mixed hyperlipidemia: Secondary | ICD-10-CM | POA: Diagnosis not present

## 2019-06-15 DIAGNOSIS — I10 Essential (primary) hypertension: Secondary | ICD-10-CM | POA: Diagnosis not present

## 2019-06-15 DIAGNOSIS — E1142 Type 2 diabetes mellitus with diabetic polyneuropathy: Secondary | ICD-10-CM | POA: Diagnosis not present

## 2019-06-15 DIAGNOSIS — R0609 Other forms of dyspnea: Secondary | ICD-10-CM

## 2019-06-15 NOTE — Patient Instructions (Signed)

## 2019-06-15 NOTE — Progress Notes (Signed)
Virtual Visit via Telephone Note   This visit type was conducted due to national recommendations for restrictions regarding the COVID-19 Pandemic (e.g. social distancing) in an effort to limit this patient's exposure and mitigate transmission in our community.  Due to her co-morbid illnesses, this patient is at least at moderate risk for complications without adequate follow up.  This format is felt to be most appropriate for this patient at this time.  The patient did not have access to video technology/had technical difficulties with video requiring transitioning to audio format only (telephone).  All issues noted in this document were discussed and addressed.  No physical exam could be performed with this format.  Please refer to the patient's chart for her  consent to telehealth for [facility].   The patient was identified using 2 identifiers.  Date:  06/15/2019   ID:  Lisa Olson, DOB 25-Jan-1961, MRN [id number]  Patient Location: Home Provider Location: Office  PCP:  Marcine Matar, MD  Cardiologist:  Jodelle Red, MD  Electrophysiologist:  None   Evaluation Performed:  Follow-Up Visit  Chief Complaint:  Questions re: stress test  History of Present Illness:    Lisa Olson is a 59 y.o. 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 She has many questions regarding planned stress test and was scheduled for a virtual visit to discuss this and answer her questions.  The patient does not have symptoms concerning for COVID-19 infection (fever, chills, cough, or new shortness of breath).   She asked to discuss the lexiscan stress test, as after she went home she came up with many questions. We discussed these together today. We reviewed the lexiscan nuclear stress test protocol, what to expect. We  discussed lexiscan (regadenason) specifically, most common side effects, typically brief duration, and reversal agents.  She wanted to make sure this would not affect her kidneys or her eyes. Discussed today. She is also worried that her heart is weak, like she has no good circulation in her body. Discussed how this test will help [place] evaluate this.   Denies recent chest pain, shortness of breath at rest. No PND, orthopnea, LE edema or unexpected weight gain. No syncope or palpitations. Does have fatigue/shortness of breath with exertion, unchanged.  Past Medical History:  Diagnosis Date  . Diabetes mellitus without complication (HCC)   . Hypertension   . Legally blind   . Visual impairment due to diabetes mellitus [facility])    Past Surgical History:  Procedure Laterality Date  . EYE SURGERY    . LASIK    . RETINAL DETACHMENT SURGERY       No outpatient medications have been marked as taking for the 06/15/19 encounter (Appointment) with Jodelle Red, MD.     Allergies:   Patient has no known allergies.   Social History   Tobacco Use  . Smoking status: Never Smoker  . Smokeless tobacco: Never Used  Substance Use Topics  . Alcohol use: Not on file  . Drug use: No     Family Hx: The patient's family history includes Diabetes in her father and mother; Hypertension in her sister.  ROS:   Please see the history of present illness.    All other systems reviewed and are negative.   Prior CV studies:   The following studies were  reviewed today: No new since last visit  Labs/Other Tests and Data Reviewed:    EKG:  An ECG dated 06/03/19 was personally reviewed today and demonstrated:  NSR, nonspecific inferolateral t wave flattening/inversion  Recent Labs: 05/25/2019: ALT 14; BNP 14.7; BUN 18; Creatinine, Ser 0.91; Hemoglobin 10.8; Platelets 270; Potassium 4.2; Sodium 138   Recent Lipid Panel Lab Results  Component Value Date/Time   CHOL 175 05/25/2019 03:30 PM   TRIG  211 (H) 05/25/2019 03:30 PM   HDL 42 05/25/2019 03:30 PM   CHOLHDL 4.2 05/25/2019 03:30 PM   CHOLHDL 4.7 05/14/2016 04:07 PM   LDLCALC 97 05/25/2019 03:30 PM    Wt Readings from Last 3 Encounters:  06/03/19 232 lb 9.6 oz (105.5 kg)  05/25/19 233 lb 9.6 oz (106 kg)  05/22/18 232 lb 9.6 oz (105.5 kg)     Objective:    Vital Signs:  There were no vitals taken for this visit.   Speaking comfortably on the phone, no audible wheezing In no acute distress Alert and oriented Normal affect Normal speech  ASSESSMENT & PLAN:    Nonspecific chest pain, dyspnea on exertion: No further pain but continues with shortness of breath with exertion. We planned for lexiscan nuclear stress test and reviewed at her initial visit, but after she went home she had many more questions/concerns -discussed at length today, all questions answered. -she is amenable to proceeding with scheduled nuclear lexiscan stress as scheduled.  Mixed hyperlipidemia: Ldl 97, TG 211 -continue atorvastatin 20 mg daily, discussed importance of taking daily at last visit  Hypertension: goal <130/80 -no vitals today at home, previously well controlled -continue amlodipine 5mg , carvedilol 3.125 mg BID, lisinopril 30 mg daily  Type II diabetes: A1c 7.8, on metformin and glimeperide. Not on insulin -complicated by neuropathy and retinopathy, legally blind -based on stress test results, consider SGLT2i/GLP1RA if concern for CAD  Cardiac risk counseling and prevention recommendations: -recommend heart healthy/Mediterranean diet, with whole grains, fruits, vegetable, fish, lean meats, nuts, and olive oil. Limit salt. -limited exercise ability -recommend avoidance of tobacco products. Avoid excess alcohol. -ASCVD risk score: The 10-year ASCVD risk score Mikey Bussing DC Brooke Bonito., et al., 2013) is: 14.1%   Values used to calculate the score:     Age: 16 years     Sex: Female     Is Non-Hispanic African American: Yes     Diabetic:  Yes     Tobacco smoker: No     Systolic Blood Pressure: 810 mmHg     Is BP treated: Yes     HDL Cholesterol: 42 mg/dL     Total Cholesterol: 175 mg/dL  COVID-19 Education: The signs and symptoms of COVID-19 were discussed with the patient and how to seek care for testing (follow up with PCP or arrange E-visit).  The importance of social distancing was discussed today.  Time:   Today, I have spent 12 minutes with the patient with telehealth technology discussing the above problems.    Patient Instructions  Medication Instructions:  Your Physician recommend you continue on your current medication as directed.    *If you need a refill on your cardiac medications before your next appointment, please call your pharmacy*   Lab Work: None  Testing/Procedures: None   Follow-Up: At [facility], you and your health needs are our priority.  As part of our continuing mission to provide you with exceptional heart care, we have created designated Provider Care Teams.  These Care Teams include your primary  Cardiologist (physician) and Advanced Practice Providers (APPs -  Physician Assistants and Nurse Practitioners) who all work together to provide you with the care you need, when you need it.  We recommend signing up for the patient portal called "MyChart".  Sign up information is provided on this After Visit Summary.  MyChart is used to connect with patients for Virtual Visits (Telemedicine).  Patients are able to view lab/test results, encounter notes, upcoming appointments, etc.  Non-urgent messages can be sent to your provider as well.   To learn more about what you can do with MyChart, go to [email or web address].    Your next appointment:   1 year(s)  The format for your next appointment:   In Person  Provider:   Jodelle Red, MD      Follow Up:  TBD based on results of stress test. If normal, follow up 1 year for prevention  Signed, Jodelle Red, MD    06/15/2019 9:40 AM    [facility]

## 2019-06-15 NOTE — Telephone Encounter (Signed)
Attempted to call the patient 3x's for her visit no answer. Left 2 voicemail's to give [place] a call back. When she calls back please reschedule her appointment. Thanks!

## 2019-06-16 ENCOUNTER — Ambulatory Visit (HOSPITAL_COMMUNITY)
Admission: RE | Admit: 2019-06-16 | Discharge: 2019-06-16 | Disposition: A | Discharge status: Home / Self Care | Payer: Medicaid Other | Source: Ambulatory Visit | Attending: Cardiovascular Disease | Admitting: Cardiovascular Disease

## 2019-06-16 ENCOUNTER — Other Ambulatory Visit: Payer: Self-pay

## 2019-06-16 DIAGNOSIS — R079 Chest pain, unspecified: Secondary | ICD-10-CM

## 2019-06-16 DIAGNOSIS — E1142 Type 2 diabetes mellitus with diabetic polyneuropathy: Secondary | ICD-10-CM | POA: Diagnosis not present

## 2019-06-16 MED ORDER — REGADENOSON 0.4 MG/5ML IV SOLN
0.4000 mg | Freq: Once | INTRAVENOUS | Status: AC
  Administered 2019-06-16: 0.4 mg via INTRAVENOUS

## 2019-06-16 MED ORDER — TECHNETIUM TC 99M TETROFOSMIN IV KIT
27.9000 | PACK | Freq: Once | INTRAVENOUS | Status: AC | PRN
  Administered 2019-06-16: 27.9 via INTRAVENOUS
  Filled 2019-06-16: qty 28

## 2019-06-17 ENCOUNTER — Ambulatory Visit (HOSPITAL_COMMUNITY)
Admission: RE | Admit: 2019-06-17 | Discharge: 2019-06-17 | Disposition: A | Discharge status: Home / Self Care | Payer: Medicaid Other | Source: Ambulatory Visit | Attending: Internal Medicine | Admitting: Internal Medicine

## 2019-06-17 DIAGNOSIS — E1142 Type 2 diabetes mellitus with diabetic polyneuropathy: Secondary | ICD-10-CM | POA: Diagnosis not present

## 2019-06-17 LAB — MYOCARDIAL PERFUSION IMAGING
LV dias vol: 60 mL (ref 46–106)
LV sys vol: 18 mL
Peak HR: 110 {beats}/min
Rest HR: 86 {beats}/min
SDS: 8
SRS: 0
SSS: 8
TID: 0.87

## 2019-06-17 MED ORDER — TECHNETIUM TC 99M TETROFOSMIN IV KIT
30.1000 | PACK | Freq: Once | INTRAVENOUS | Status: AC | PRN
  Administered 2019-06-17: 30.1 via INTRAVENOUS

## 2019-06-18 DIAGNOSIS — E1142 Type 2 diabetes mellitus with diabetic polyneuropathy: Secondary | ICD-10-CM | POA: Diagnosis not present

## 2019-06-18 NOTE — Progress Notes (Addendum)
[facility] Note  06/22/2019     CHIEF COMPLAINT Patient presents for Retina Follow Up   HISTORY OF PRESENT ILLNESS: Lisa Olson is a 59 y.o. female who presents to the clinic today for:   HPI    Retina Follow Up    Patient presents with  Diabetic Retinopathy.  In right eye.  Severity is severe.  Duration: 1 year, 9 months.  Since onset it is stable.  I, the attending physician,  performed the HPI with the patient and updated documentation appropriately.          Comments    Patient lost to follow-up since 2019. Patient states vision the same OU. Still can't see out of either eye. BS was 133 this am. Last a1c was 7.2, checked in June 2020.        Last edited by [name], MD on 06/22/2019  7:47 PM. (History)    Pt states she is delayed to follow up due to being out of town and covid, she states she is back bc her PCP has been asking her if she has been to the eye dr, pt states she has adapted to her vision, she states she is on metformin and glimeperide, pt states she is registered with services for the blind, but with covid, they have not been much help, pt has not had cataract sx or seen Dr. Katy Fitch since her last visit here, pt did see Dr. Manuella Ghazi for a second opinion, she states he agreed with Dr. Dairl Ponder assessment of her vision, pt states she is not interested in having sx at this time   Referring physician: [name], MD [place],  [place] [address]  HISTORICAL INFORMATION:   Selected notes from the MEDICAL RECORD NUMBER Referred by Dr. Wyatt Portela for DM exam LEE: 05.22.19 (S. Groat) [BCVA: OD: HM OS: HM]  Ocular Hx-Cataract OU, Diabetic Retinopathy OU, Retinal Vein Occlusion PMH-DM (last A1C 7.2, taking Metformin), HTN    CURRENT MEDICATIONS: No current outpatient medications on file. (Ophthalmic Drugs)   No current facility-administered medications for this visit. (Ophthalmic Drugs)   Current Outpatient Medications  (Other)  Medication Sig  . Accu-Chek Softclix Lancets lancets Use as instructed  . amLODipine (NORVASC) 5 MG tablet Take 1 tablet (5 mg total) by mouth daily.  Marland Kitchen atorvastatin (LIPITOR) 20 MG tablet Take 1 tablet (20 mg total) by mouth daily.  . Blood Glucose Monitoring Suppl (ACCU-CHEK AVIVA PLUS) w/Device KIT Use as directed  . Calcium 600-400 MG-UNIT CHEW Chew 1 tablet by mouth daily.  . carvedilol (COREG) 3.125 MG tablet Take 1 tablet (3.125 mg total) by mouth 2 (two) times daily with a meal.  . ferrous sulfate 325 (65 FE) MG tablet Take 1 tablet (325 mg total) by mouth daily with breakfast.  . furosemide (LASIX) 20 MG tablet Take 1 tablet (20 mg total) by mouth daily. Stop Hydrochlorothiazide  . glimepiride (AMARYL) 4 MG tablet Take 1 tablet (4 mg total) by mouth 2 (two) times daily.  Marland Kitchen glucose blood test strip USE 1 STRIP TO CHECK GLUCOSE THREE TIMES DAILY AS DIRECTED  . Lancet Devices (ACCU-CHEK SOFTCLIX) lancets Use as instructed  . lisinopril (ZESTRIL) 30 MG tablet Take 1 tablet (30 mg total) by mouth daily.  . metFORMIN (GLUCOPHAGE) 1000 MG tablet TAKE 1 TABLET BY MOUTH TWICE DAILY WITH A MEAL .  Marland Kitchen Multiple Vitamins-Minerals (MULTIVITAMIN WITH MINERALS) tablet Take 1 tablet by mouth daily.  No current facility-administered medications for this visit. (Other)      REVIEW OF SYSTEMS: ROS    Positive for: Musculoskeletal, Endocrine, Eyes   Negative for: Constitutional, Gastrointestinal, Neurological, Skin, Genitourinary, HENT, Cardiovascular, Respiratory, Psychiatric, Allergic/Imm, Heme/Lymph   Last edited by Roselee Nova D, COT on 06/22/2019  9:57 AM. (History)       ALLERGIES No Known Allergies  PAST MEDICAL HISTORY Past Medical History:  Diagnosis Date  . Diabetes mellitus without complication ([place])   . Hypertension   . Legally blind   . 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 EYE SURGERY    . LASIK     . RETINAL DETACHMENT SURGERY      FAMILY HISTORY Family History  Problem Relation Age of Onset  . Diabetes Mother   . Diabetes Father   . Hypertension Sister     SOCIAL HISTORY Social History   Tobacco Use  . Smoking status: Never Smoker  . Smokeless tobacco: Never Used  Substance Use Topics  . Alcohol use: Not on file  . Drug use: No         OPHTHALMIC EXAM:  Base Eye Exam    Visual Acuity (Snellen - Linear)      Right Left   Dist [place] HM CF @ face       Tonometry (Tonopen, 10:02 AM)      Right Left   Pressure 23 21       Pupils      Dark Light Shape React APD   Right 2 1 Round Minimal None   Left 2 1 Round Minimal None       Visual Fields      Left Right   Restrictions Total superior temporal, inferior temporal, superior nasal, inferior nasal deficiencies Total superior temporal, inferior temporal, superior nasal, inferior nasal deficiencies       Extraocular Movement      Right Left    Full, Ortho Full, Ortho       Neuro/Psych    Oriented x3: Yes   Mood/Affect: Normal       Dilation    Both eyes: 1.0% Mydriacyl, 2.5% Phenylephrine @ 10:02 AM        Slit Lamp and Fundus Exam    Slit Lamp Exam      Right Left   Lids/Lashes Dermatochalasis - upper lid, mild Meibomian gland dysfunction Mild Meibomian gland dysfunction   Conjunctiva/Sclera Mild Melanosis, otherwise normal Nasal and Temporal Pinguecula   Cornea Mild Arcus, otherwise clear Mild Arcus, otherwise clear   Anterior Chamber Deep and quiet Deep and quiet   Iris Round and dilated, No NVI Round and dilated, No NVI   Lens 3+ Nuclear sclerosis with brunescence, 2+ Cortical cataract, 3+ Posterior subcapsular cataract, Vacuoles 2-3+ Nuclear sclerosis, 2+ Cortical cataract, 2+ Posterior subcapsular cataract, Vacuoles   Vitreous Mild Vitreous syneresis Mild Vitreous syneresis       Fundus Exam      Right Left   Disc fibrosis overlying Pallor, mild fibrosis   Macula Wolf jaw TRD with  macular hole, macula completely detached, interval increase in PRF Flat, atrophic, fibrosis and pigmented scarring temporal half of macula   Vessels Severe Vascular attenuation Severe Vascular attenuation   Periphery Attached, 360 PRP, room for PRP fill-in peripherally Attached, 360 PRP, fiborsis nasal to disc, room for PRP fill-in superiorly          IMAGING AND PROCEDURES  Imaging and Procedures for _0 @  OCT, Retina - OU - Both Eyes       Right Eye Quality was poor (uninterruptable ). Progression has been stable. Findings include preretinal fibrosis, subretinal fluid, vitreous traction, intraretinal fluid.   Left Eye Quality was good. Central Foveal Thickness: 244. Progression has been stable. Findings include abnormal foveal contour, epiretinal membrane, lamellar hole, outer retinal atrophy, retinal drusen , subretinal hyper-reflective material (Sub-retinal fibrosis/SRHM temporal macula caught on widefield).   Notes *Images captured and stored on drive  Diagnosis / Impression:  OD: fibrosis with tractional retinal detachment OS: diffuse retinal atrophy, ERM with lamellar hole  Clinical management:  See below  Abbreviations: NFP - Normal foveal profile. CME - cystoid macular edema. PED - pigment epithelial detachment. IRF - intraretinal fluid. SRF - subretinal fluid. EZ - ellipsoid zone. ERM - epiretinal membrane. ORA - outer retinal atrophy. ORT - outer retinal tubulation. SRHM - subretinal hyper-reflective material                  ASSESSMENT/PLAN:    ICD-10-CM   1. 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 Macular hole of right eye  H35.341   3. Retinal detachment, tractional, right eye  H33.41   4. Stable proliferative diabetic retinopathy of left eye associated with type 2 diabetes mellitus ([place])  U13.1438   5. Retinal  edema  H35.81 OCT, Retina - OU - Both Eyes  6. Low vision, both eyes  H54.3     1-3. 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D  - pt is delayed to follow up due to being out of town / covid  - pt with long standing history of low vision / legal blindness  - onset of vision loss ~2015 in [place] -- underwent PRP laser OD, PPV OS   - today, reports no acute change in vision OD, but on exam has persistent complete macular detachment with macular hole -- wolf-jaw fibrosis emanating from the disc  - periphery attached with good PRP in place  - exam relatively stable compared to last visit in 2019  - discussed findings, very guarded prognosis, and treatment options  - given longstanding VA of HM OD, unlikely that anatomic reattachment of retina will yield significantly improved vision  - discussed possibility of surgery making vision worse  - pt wishes to monitor for now  - f/u 9-12 months, sooner prn, to monitor for any acute changes in symptoms or exam  4. Stable PDR OS  - s/p PPV and laser PRP OS ~2015 in [place]  - significant macular atrophy OS  - no active disease at this time  - room for PRP fill in peripherally if needed  - monitor  5. Mild non-central retinal edema OS  - monitor  6. Legal blindness OU  - VA HM OU  - long-standing -- onset ~2015  - etiology severe PDR as above  - worked at [employer] for the [employer] Ordered this visit:  No orders of the defined types were placed in this encounter.      Return for f/u 9-12 months, PDR OU, DFE, OCT.  There are no Patient Instructions on file for this visit.   Explained the diagnoses, plan, and follow up with the patient and they expressed understanding.  Patient expressed understanding of the importance of proper follow up care.  This document serves as a record of services personally performed by Gardiner Sleeper, MD, PhD. It was created on their behalf by Leeann Must, [place], a  certified ophthalmic assistant. The creation of this record is the provider's dictation and/or activities during the visit.    Electronically signed by: Leeann Must, COA _0 @ 7:53 PM   This document serves as a record of services personally performed by Gardiner Sleeper, MD, PhD. It was created on their behalf by Ernest Mallick, OA, an ophthalmic assistant. The creation of this record is the provider's dictation and/or activities during the visit.    Electronically signed by: Ernest Mallick, OA 04.05.2021 7:53 PM   Gardiner Sleeper, M.D., Ph.D. Diseases & Surgery of the Retina and Vitreous [facility]  I have reviewed the above documentation for accuracy and completeness, and I agree with the above. Gardiner Sleeper, M.D., Ph.D. 06/22/19 7:53 PM   Abbreviations: M myopia (nearsighted); A astigmatism; H hyperopia (farsighted); P presbyopia; Mrx spectacle prescription;  CTL contact lenses; OD right eye; OS left eye; OU both eyes  XT exotropia; ET esotropia; PEK punctate epithelial keratitis; PEE punctate epithelial erosions; DES dry eye syndrome; MGD meibomian gland dysfunction; ATs artificial tears; PFAT's preservative free artificial tears; [place] nuclear sclerotic cataract; PSC posterior subcapsular cataract; ERM epi-retinal membrane; PVD posterior vitreous detachment; RD retinal detachment; DM diabetes mellitus; DR diabetic retinopathy; NPDR non-proliferative diabetic retinopathy; PDR proliferative diabetic retinopathy; CSME clinically significant macular edema; DME diabetic macular edema; dbh dot blot hemorrhages; CWS cotton wool spot; POAG primary open angle glaucoma; C/D cup-to-disc ratio; HVF humphrey visual field; GVF goldmann visual field; OCT optical coherence tomography; IOP intraocular pressure; BRVO Branch retinal vein occlusion; CRVO central retinal vein occlusion; CRAO central retinal artery occlusion; BRAO branch retinal artery occlusion; RT retinal tear; SB scleral  buckle; PPV pars plana vitrectomy; VH Vitreous hemorrhage; PRP panretinal laser photocoagulation; IVK intravitreal kenalog; VMT vitreomacular traction; MH Macular hole;  NVD neovascularization of the disc; NVE neovascularization elsewhere; AREDS age related eye disease study; ARMD age related macular degeneration; POAG primary open angle glaucoma; EBMD epithelial/anterior basement membrane dystrophy; ACIOL anterior chamber intraocular lens; IOL intraocular lens; PCIOL posterior chamber intraocular lens; Phaco/IOL phacoemulsification with intraocular lens placement; [place] photorefractive keratectomy; LASIK laser assisted in situ keratomileusis; HTN hypertension; DM diabetes mellitus; COPD chronic obstructive pulmonary disease

## 2019-06-19 DIAGNOSIS — E1142 Type 2 diabetes mellitus with diabetic polyneuropathy: Secondary | ICD-10-CM | POA: Diagnosis not present

## 2019-06-22 ENCOUNTER — Ambulatory Visit (INDEPENDENT_AMBULATORY_CARE_PROVIDER_SITE_OTHER): Payer: Medicaid Other | Admitting: Ophthalmology

## 2019-06-22 ENCOUNTER — Encounter (INDEPENDENT_AMBULATORY_CARE_PROVIDER_SITE_OTHER): Payer: Self-pay | Admitting: Ophthalmology

## 2019-06-22 DIAGNOSIS — H543 Unqualified visual loss, both eyes: Secondary | ICD-10-CM

## 2019-06-22 DIAGNOSIS — E113541 Type 2 diabetes mellitus with proliferative diabetic retinopathy with combined traction retinal detachment and rhegmatogenous retinal detachment, right eye: Secondary | ICD-10-CM

## 2019-06-22 DIAGNOSIS — H3581 Retinal edema: Secondary | ICD-10-CM

## 2019-06-22 DIAGNOSIS — E113552 Type 2 diabetes mellitus with stable proliferative diabetic retinopathy, left eye: Secondary | ICD-10-CM

## 2019-06-22 DIAGNOSIS — H35341 Macular cyst, hole, or pseudohole, right eye: Secondary | ICD-10-CM

## 2019-06-22 DIAGNOSIS — H3341 Traction detachment of retina, right eye: Secondary | ICD-10-CM

## 2019-06-22 DIAGNOSIS — E1142 Type 2 diabetes mellitus with diabetic polyneuropathy: Secondary | ICD-10-CM | POA: Diagnosis not present

## 2019-06-23 DIAGNOSIS — E1142 Type 2 diabetes mellitus with diabetic polyneuropathy: Secondary | ICD-10-CM | POA: Diagnosis not present

## 2019-06-24 DIAGNOSIS — E1142 Type 2 diabetes mellitus with diabetic polyneuropathy: Secondary | ICD-10-CM | POA: Diagnosis not present

## 2019-06-25 DIAGNOSIS — E1142 Type 2 diabetes mellitus with diabetic polyneuropathy: Secondary | ICD-10-CM | POA: Diagnosis not present

## 2019-06-25 NOTE — Progress Notes (Deleted)
Cardiology Clinic Note   Patient Name: Lisa Olson Date of Encounter: 06/25/2019  Primary Care Provider:  [name], MD Primary Cardiologist:  Buford Dresser, MD  Patient Profile    Lisa Olson 59 year old female presents today for follow-up and discussion of her stress test.  Past Medical History    Past Medical History:  Diagnosis Date  . Diabetes mellitus without complication ([place])   . Hypertension   . Legally blind   . 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 EYE SURGERY    . LASIK    . RETINAL DETACHMENT SURGERY      Allergies  No Known Allergies  History of Present Illness    Ms. Pote has a PMH of essential hypertension, type 2 diabetes, HLD, neuropathy, and chest discomfort.  She was referred by Dr.  Wynetta Emery for an evaluation and management of her chest pain.  She was seen by Dr. Harrell Gave on 06/15/2019 and had several questions about her upcoming [place].  The test was explained and all questions were answered.  At that time she denied recent chest pain, increased shortness of breath, PND, orthopnea, lower extremity edema/weight gain, syncope, and palpitations.  She did have fatigue/shortness of breath with exertion which was unchanged.  Her stress test showed (06/17/2019) an EF of 70%, no ST segment deviation, findings consistent with ischemia.  Intermittent risk study.  She presents to the clinic today to discuss her nuclear stress test and states***  *** denies chest pain, shortness of breath, lower extremity edema, fatigue, palpitations, melena, hematuria, hemoptysis, diaphoresis, weakness, presyncope, syncope, orthopnea, and PND.  Home Medications    Prior to Admission medications   Medication Sig Start Date End Date Taking? Authorizing Provider  Accu-Chek Softclix Lancets lancets Use as instructed 06/06/18   [name], MD  amLODipine (NORVASC) 5 MG tablet Take 1 tablet (5  mg total) by mouth daily. 08/26/18   [name], MD  atorvastatin (LIPITOR) 20 MG tablet Take 1 tablet (20 mg total) by mouth daily. 05/25/19   [name], MD  Blood Glucose Monitoring Suppl (ACCU-CHEK AVIVA PLUS) w/Device KIT Use as directed 07/02/17   Charlott Rakes, MD  Calcium 600-400 MG-UNIT CHEW Chew 1 tablet by mouth daily. 02/23/16   Alfonse Spruce, FNP  carvedilol (COREG) 3.125 MG tablet Take 1 tablet (3.125 mg total) by mouth 2 (two) times daily with a meal. 05/25/19   [name], MD  ferrous sulfate 325 (65 FE) MG tablet Take 1 tablet (325 mg total) by mouth daily with breakfast. 07/29/17   [name], MD  furosemide (LASIX) 20 MG tablet Take 1 tablet (20 mg total) by mouth daily. Stop Hydrochlorothiazide 05/25/19   [name], MD  glimepiride (AMARYL) 4 MG tablet Take 1 tablet (4 mg total) by mouth 2 (two) times daily. 05/25/19   [name], MD  glucose blood test strip USE 1 STRIP TO CHECK GLUCOSE THREE TIMES DAILY AS DIRECTED 09/15/18   [name], MD  Lancet Devices [facility]) lancets Use as instructed 03/06/16   Alfonse Spruce, FNP  lisinopril (ZESTRIL) 30 MG tablet Take 1 tablet (30 mg total) by mouth daily. 05/25/19   [name], MD  metFORMIN (GLUCOPHAGE) 1000 MG tablet TAKE 1 TABLET BY MOUTH TWICE DAILY WITH A MEAL . 05/25/19   [name], MD  Multiple Vitamins-Minerals (MULTIVITAMIN WITH MINERALS) tablet Take  1 tablet by mouth daily.    [provider]    Family History    Family History  Problem Relation Age of Onset  . Diabetes Mother   . Diabetes Father   . Hypertension Sister    She indicated that her mother is deceased. She indicated that her father is deceased. She indicated that the status of her sister is unknown.  Social History    Social History   Socioeconomic History  . Marital status: Single    Spouse name: Not on file  . Number of children: Not on file  . Years of  education: Not on file  . Highest education level: Not on file  Occupational History  . Not on file  Tobacco Use  . Smoking status: Never Smoker  . Smokeless tobacco: Never Used  Substance and Sexual Activity  . Alcohol use: Not on file  . Drug use: No  . Sexual activity: Not on file  Other Topics Concern  . Not on file  Social History Narrative  . Not on file   Social Determinants of Health   Financial Resource Strain:   . Difficulty of Paying Living Expenses:   Food Insecurity:   . Worried About Charity fundraiser in the Last Year:   . Arboriculturist in the Last Year:   Transportation Needs:   . Film/video editor (Medical):   Marland Kitchen Lack of Transportation (Non-Medical):   Physical Activity:   . Days of Exercise per Week:   . Minutes of Exercise per Session:   Stress:   . Feeling of Stress :   Social Connections:   . Frequency of Communication with Friends and Family:   . Frequency of Social Gatherings with Friends and Family:   . Attends Religious Services:   . Active Member of Clubs or Organizations:   . Attends Archivist Meetings:   Marland Kitchen Marital Status:   Intimate Partner Violence:   . Fear of Current or Ex-Partner:   . Emotionally Abused:   Marland Kitchen Physically Abused:   . Sexually Abused:      Review of Systems    General:  No chills, fever, night sweats or weight changes.  Cardiovascular:  No chest pain, dyspnea on exertion, edema, orthopnea, palpitations, paroxysmal nocturnal dyspnea. Dermatological: No rash, lesions/masses Respiratory: No cough, dyspnea Urologic: No hematuria, dysuria Abdominal:   No nausea, vomiting, diarrhea, bright red blood per rectum, melena, or hematemesis Neurologic:  No visual changes, wkns, changes in mental status. All other systems reviewed and are otherwise negative except as noted above.  Physical Exam    VS:  There were no vitals taken for this visit. , BMI There is no height or weight on file to calculate BMI. GEN:  Well nourished, well developed, in no acute distress. HEENT: normal. Neck: Supple, no JVD, carotid bruits, or masses. Cardiac: RRR, no murmurs, rubs, or gallops. No clubbing, cyanosis, edema.  Radials/DP/PT 2+ and equal bilaterally.  Respiratory:  Respirations regular and unlabored, clear to auscultation bilaterally. GI: Soft, nontender, nondistended, BS + x 4. MS: no deformity or atrophy. Skin: warm and dry, no rash. Neuro:  Strength and sensation are intact. Psych: Normal affect.  Accessory Clinical Findings    ECG personally reviewed by me today-none today.  EKG 06/03/2019 Normal sinus rhythm nonspecific T wave abnormality 85 bpm  Echocardiogram 09/04/2018 IMPRESSIONS    1. The left ventricle has normal systolic function with an ejection  fraction of 60-65%. The cavity  size was normal. There is mildly increased  left ventricular wall thickness. Left ventricular diastolic Doppler  parameters are consistent with impaired  relaxation. Indeterminate filling pressures The E/e' is 8-15.  2. The right ventricle has normal systolic function. The cavity was  normal. There is no increase in right ventricular wall thickness.  3. The mitral valve is abnormal. Mild thickening of the mitral valve  leaflet. There is mild mitral annular calcification present. No evidence  of mitral valve stenosis.  4. The tricuspid valve is grossly normal.  5. The aortic valve is abnormal. Mild sclerosis of the aortic valve. No  stenosis of the aortic valve.  6. The aortic root and ascending aorta are normal in size and structure.   Nuclear stress test 06/17/2019  Nuclear stress EF: 70%.  The left ventricular ejection fraction is hyperdynamic (>65%).  There was no ST segment deviation noted during stress.  Findings consistent with ischemia.  This is an intermediate risk study.   Moderate, reversible, medium size defect in the apical septum and anteroseptum, and the mid anterior wall, that  returns to normal at rest. Findings consistent with ischemia.   Assessment & Plan   1.  Nonspecific chest pain, dyspnea on exertion-no chest pain today.  06/17/2019 nuclear stress test showed EF 70%, no ST segment deviation during stress test, findings consistent with ischemia.  Moderate reversible medium-sized defect in apical septum and anterioseptum, and mid anterior wall that returned to normal at rest.  Discussed multiple treatment options including medication and cardiac catheterization.  Patient elected for****   Mixed hyperlipidemia-LDL 97 on 05/25/2019 Continue atorvastatin 20 mg daily Heart healthy low-sodium high-fiber diet Increase physical activity as tolerated  Essential hypertension-BP today*** Continue amlodipine 5 mg daily Continue carvedilol 3.125 mg twice daily Continue lisinopril 30 mg daily Heart healthy low-sodium diet-salty 6 given Increase physical activity as tolerated  Type 2 diabetes-A1c 7.8 on 05/25/2019 Recommend GLP-1 therapy  Followed by PCP  Disposition: Follow-up with Dr. Harrell Gave in 1 month.   Jossie Ng. [facility] [place] [address] Office [phone] Fax [phone]

## 2019-06-26 ENCOUNTER — Ambulatory Visit: Payer: Medicaid Other | Admitting: General Practice

## 2019-06-26 DIAGNOSIS — E1142 Type 2 diabetes mellitus with diabetic polyneuropathy: Secondary | ICD-10-CM | POA: Diagnosis not present

## 2019-06-29 DIAGNOSIS — E1142 Type 2 diabetes mellitus with diabetic polyneuropathy: Secondary | ICD-10-CM | POA: Diagnosis not present

## 2019-06-30 DIAGNOSIS — E1142 Type 2 diabetes mellitus with diabetic polyneuropathy: Secondary | ICD-10-CM | POA: Diagnosis not present

## 2019-06-30 NOTE — Progress Notes (Signed)
 Cardiology Clinic Note   Patient Name: Lisa Olson Date of Encounter: 07/01/2019  Primary Care Provider:  Johnson, Lisa B, MD Primary Cardiologist:  Lisa Christopher, MD  Patient Profile    Lisa Olson 59-year-old female presents today for follow-up and discussion of her stress test.  Past Medical History    Past Medical History:  Diagnosis Date  . Diabetes mellitus without complication (HCC)   . Hypertension   . Legally blind   . 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 EYE SURGERY    . LASIK    . RETINAL DETACHMENT SURGERY      Allergies  No Known Allergies  History of Present Illness    Lisa Olson has a PMH of essential hypertension, type 2 diabetes, HLD, neuropathy, and chest discomfort.  She was referred by Dr.  Johnson for an evaluation and management of her chest pain.  She was seen by Lisa Olson on 06/15/2019 and had several questions about her upcoming Lexiscan.  The test was explained and all questions were answered.  At that time she denied recent chest pain, increased shortness of breath, PND, orthopnea, lower extremity edema/weight gain, syncope, and palpitations.  She did have fatigue/shortness of breath with exertion which was unchanged.  Her stress test showed (06/17/2019) an EF of 70%, no ST segment deviation, findings consistent with ischemia.  Intermittent risk study.  She presents to the clinic today to discuss her nuclear stress test and states has not noticed any chest pressure or chest pain. She continues to exercise 3 or more times a week. Her exercise includes walking and climbing hills. She states that she notices her heart rate increases with increased physical activity and decreases back to normal rate with normal activity. She has noticed that her blood pressure has been higher at home in the 140s and 150s. Her blood pressure in the office today is 136/70. Her nuclear stress test  was reviewed and options of cardiac catheterization and medication were discussed. She expressed understanding and wishes to try Imdur at this time and not go forward with cardiac catheterization. She indicates that her memory has not been as good with atorvastatin and states that she noticed this in the past when she took the medication. I will change her to Crestor 10. I gave her the salty 6 information sheet encouraged her to continue physical activity and will have her come back in 2 weeks for follow-up evaluation.  Today she denies chest pain, shortness of breath, lower extremity edema, fatigue, palpitations, melena, hematuria, hemoptysis, diaphoresis, weakness, presyncope, syncope, orthopnea, and PND.  Home Medications    Prior to Admission medications   Medication Sig Start Date End Date Taking? Authorizing Provider  Accu-Chek Softclix Lancets lancets Use as instructed 06/06/18   Olson, Lisa B, MD  amLODipine (NORVASC) 5 MG tablet Take 1 tablet (5 mg total) by mouth daily. 08/26/18   Olson, Lisa B, MD  atorvastatin (LIPITOR) 20 MG tablet Take 1 tablet (20 mg total) by mouth daily. 05/25/19   Olson, Lisa B, MD  Blood Glucose Monitoring Suppl (ACCU-CHEK AVIVA PLUS) w/Device KIT Use as directed 07/02/17   Newlin, Enobong, MD  Calcium 600-400 MG-UNIT CHEW Chew 1 tablet by mouth daily. 02/23/16   Hairston, Mandesia R, FNP  carvedilol (COREG) 3.125 MG tablet Take 1 tablet (3.125 mg total) by mouth 2 (two) times daily with a meal. 05/25/19   Olson, Lisa B, MD    ferrous sulfate 325 (65 FE) MG tablet Take 1 tablet (325 mg total) by mouth daily with breakfast. 07/29/17   Olson, Lisa B, MD  furosemide (LASIX) 20 MG tablet Take 1 tablet (20 mg total) by mouth daily. Stop Hydrochlorothiazide 05/25/19   Olson, Lisa B, MD  glimepiride (AMARYL) 4 MG tablet Take 1 tablet (4 mg total) by mouth 2 (two) times daily. 05/25/19   Olson, Lisa B, MD  glucose blood test strip USE 1 STRIP TO CHECK  GLUCOSE THREE TIMES DAILY AS DIRECTED 09/15/18   Olson, Lisa B, MD  Lancet Devices (ACCU-CHEK SOFTCLIX) lancets Use as instructed 03/06/16   Hairston, Mandesia R, FNP  lisinopril (ZESTRIL) 30 MG tablet Take 1 tablet (30 mg total) by mouth daily. 05/25/19   Olson, Lisa B, MD  metFORMIN (GLUCOPHAGE) 1000 MG tablet TAKE 1 TABLET BY MOUTH TWICE DAILY WITH A MEAL . 05/25/19   Olson, Lisa B, MD  Multiple Vitamins-Minerals (MULTIVITAMIN WITH MINERALS) tablet Take 1 tablet by mouth daily.    [provider]    Family History    Family History  Problem Relation Age of Onset  . Diabetes Mother   . Diabetes Father   . Hypertension Sister    She indicated that her mother is deceased. She indicated that her father is deceased. She indicated that the status of her sister is unknown.  Social History    Social History   Socioeconomic History  . Marital status: Single    Spouse name: Not on file  . Number of children: Not on file  . Years of education: Not on file  . Highest education level: Not on file  Occupational History  . Not on file  Tobacco Use  . Smoking status: Never Smoker  . Smokeless tobacco: Never Used  Substance and Sexual Activity  . Alcohol use: Not on file  . Drug use: No  . Sexual activity: Not on file  Other Topics Concern  . Not on file  Social History Narrative  . Not on file   Social Determinants of Health   Financial Resource Strain:   . Difficulty of Paying Living Expenses:   Food Insecurity:   . Worried About Running Out of Food in the Last Year:   . Ran Out of Food in the Last Year:   Transportation Needs:   . Lack of Transportation (Medical):   . Lack of Transportation (Non-Medical):   Physical Activity:   . Days of Exercise per Week:   . Minutes of Exercise per Session:   Stress:   . Feeling of Stress :   Social Connections:   . Frequency of Communication with Friends and Family:   . Frequency of Social Gatherings with Friends  and Family:   . Attends Religious Services:   . Active Member of Clubs or Organizations:   . Attends Club or Organization Meetings:   . Marital Status:   Intimate Partner Violence:   . Fear of Current or Ex-Partner:   . Emotionally Abused:   . Physically Abused:   . Sexually Abused:      Review of Systems    General:  No chills, fever, night sweats or weight changes.  Cardiovascular:  No chest pain, dyspnea on exertion, edema, orthopnea, palpitations, paroxysmal nocturnal dyspnea. Dermatological: No rash, lesions/masses Respiratory: No cough, dyspnea Urologic: No hematuria, dysuria Abdominal:   No nausea, vomiting, diarrhea, bright red blood per rectum, melena, or hematemesis Neurologic:  No visual changes, wkns, changes in mental status.   All other systems reviewed and are otherwise negative except as noted above.  Physical Exam    VS:  BP 136/70   Pulse 82   Temp 97.7 F (36.5 C) (Temporal)   Ht 5' 7" (1.702 m)   Wt 231 lb 12.8 oz (105.1 kg)   BMI 36.31 kg/m  , BMI Body mass index is 36.31 kg/m. GEN: Well nourished, well developed, in no acute distress. HEENT: normal. Neck: Supple, no JVD, carotid bruits, or masses. Cardiac: RRR, no murmurs, rubs, or gallops. No clubbing, cyanosis, edema.  Radials/DP/PT 2+ and equal bilaterally.  Respiratory:  Respirations regular and unlabored, clear to auscultation bilaterally. GI: Soft, nontender, nondistended, BS + x 4. MS: no deformity or atrophy. Skin: warm and dry, no rash. Neuro:  Strength and sensation are intact. Psych: Normal affect.  Accessory Clinical Findings    ECG personally reviewed by me today-none today.  EKG 06/03/2019 Normal sinus rhythm nonspecific T wave abnormality 85 bpm  Echocardiogram 09/04/2018 IMPRESSIONS     1. The left ventricle has normal systolic function with an ejection  fraction of 60-65%. The cavity size was normal. There is mildly increased  left ventricular wall thickness. Left  ventricular diastolic Doppler  parameters are consistent with impaired  relaxation. Indeterminate filling pressures The E/e' is 8-15.   2. The right ventricle has normal systolic function. The cavity was  normal. There is no increase in right ventricular wall thickness.   3. The mitral valve is abnormal. Mild thickening of the mitral valve  leaflet. There is mild mitral annular calcification present. No evidence  of mitral valve stenosis.   4. The tricuspid valve is grossly normal.   5. The aortic valve is abnormal. Mild sclerosis of the aortic valve. No  stenosis of the aortic valve.   6. The aortic root and ascending aorta are normal in size and structure.   Nuclear stress test 06/17/2019  Nuclear stress EF: 70%.  The left ventricular ejection fraction is hyperdynamic (>65%).  There was no ST segment deviation noted during stress.  Findings consistent with ischemia.  This is an intermediate risk study.   Moderate, reversible, medium size defect in the apical septum and anteroseptum, and the mid anterior wall, that returns to normal at rest. Findings consistent with ischemia.   Assessment & Plan   1.  Nonspecific chest pain, dyspnea on exertion-no chest pain today.  06/17/2019 nuclear stress test showed EF 70%, no ST segment deviation during stress test, findings consistent with ischemia.  Moderate reversible medium-sized defect in apical septum and anterioseptum, and mid anterior wall that returned to normal at rest.  Discussed multiple treatment options including medication and cardiac catheterization.  Patient elected for medication management. Start Imdur 15 mg daily Heart healthy low-sodium diet Increase physical activity as tolerated   Mixed hyperlipidemia-LDL 97 on 05/25/2019 Stop atorvastatin 20 mg daily Start Crestor 10 mg daily Heart healthy low-sodium high-fiber diet Increase physical activity as tolerated  Essential hypertension-BP today 136/70. Has been somewhat  elevated at home. Continue amlodipine 5 mg daily Continue carvedilol 3.125 mg twice daily Continue lisinopril 30 mg daily Heart healthy low-sodium diet-salty 6 given Increase physical activity as tolerated Blood pressure log given-instructed to bring to next appointment with her blood pressure cuff  Type 2 diabetes-A1c 7.8 on 05/25/2019 Recommend GLP-1 therapy  Followed by PCP  Disposition: Follow-up with Lisa Olson or me in 2 weeks.   Jesse M. Cleaver NP-C       [facility]   [place] Office [phone] Fax [phone]

## 2019-07-01 ENCOUNTER — Other Ambulatory Visit: Payer: Self-pay

## 2019-07-01 ENCOUNTER — Encounter: Payer: Self-pay | Admitting: General Practice

## 2019-07-01 ENCOUNTER — Ambulatory Visit: Payer: Medicaid Other | Admitting: General Practice

## 2019-07-01 VITALS — BP 136/70 | HR 82 | Temp 97.7°F | Ht 67.0 in | Wt 231.8 lb

## 2019-07-01 DIAGNOSIS — E782 Mixed hyperlipidemia: Secondary | ICD-10-CM | POA: Diagnosis not present

## 2019-07-01 DIAGNOSIS — R0600 Dyspnea, unspecified: Secondary | ICD-10-CM | POA: Diagnosis not present

## 2019-07-01 DIAGNOSIS — E1142 Type 2 diabetes mellitus with diabetic polyneuropathy: Secondary | ICD-10-CM | POA: Diagnosis not present

## 2019-07-01 DIAGNOSIS — I10 Essential (primary) hypertension: Secondary | ICD-10-CM | POA: Diagnosis not present

## 2019-07-01 DIAGNOSIS — R079 Chest pain, unspecified: Secondary | ICD-10-CM

## 2019-07-01 DIAGNOSIS — E118 Type 2 diabetes mellitus with unspecified complications: Secondary | ICD-10-CM

## 2019-07-01 DIAGNOSIS — R0609 Other forms of dyspnea: Secondary | ICD-10-CM

## 2019-07-01 MED ORDER — ISOSORBIDE MONONITRATE ER 30 MG PO TB24: 30.0000 mg | ORAL_TABLET | Freq: Every day | ORAL | 3 refills | Status: DC

## 2019-07-01 MED ORDER — ROSUVASTATIN CALCIUM 10 MG PO TABS: 10.0000 mg | ORAL_TABLET | Freq: Every day | ORAL | 3 refills | Status: DC

## 2019-07-01 MED FILL — ROSUVASTATIN CALCIUM 10 MG: 10 | 90 days supply | Qty: 90 | Fill #0

## 2019-07-01 MED FILL — ISOSORBIDE MN ER 30 MG TAB: 30 | 90 days supply | Qty: 90 | Fill #0

## 2019-07-01 NOTE — Patient Instructions (Addendum)
Medication Instructions:  STOP ATORVASTATIN  START rosuvastatin (CRESTOR) 10 MG Daily   START ISOSORBIDE 15MG  DAILY  *If you need a refill on your cardiac medications before your next appointment, please call your pharmacy*  Special Instructions TAKE AND LOG YOUR BLOOD PRESSURE DAILY AND BRING LOG AND BLOOD PRESSURE CUFF WITH YOU TO YOUR FOLLOW UP APPOINTMENT IN 2 WEEKS   PLEASE READ AND FOLLOW SALTY 6-ATTACHED  Follow-Up: Your next appointment:  2 week(s) In Person with , MD ON 07/17/2019 @ 3:20 PM  At [facility], you and your health needs are our priority.  As part of our continuing mission to provide you with exceptional heart care, we have created designated Provider Care Teams.  These Care Teams include your primary Cardiologist (physician) and Advanced Practice Providers (APPs -  Physician Assistants and Nurse Practitioners) who all work together to provide you with the care you need, when you need it.  We recommend signing up for the patient portal called "MyChart".  Sign up information is provided on this After Visit Summary.  MyChart is used to connect with patients for Virtual Visits (Telemedicine).  Patients are able to view lab/test results, encounter notes, upcoming appointments, etc.  Non-urgent messages can be sent to your provider as well.   To learn more about what you can do with MyChart, go to [facility].

## 2019-07-02 ENCOUNTER — Other Ambulatory Visit: Payer: Self-pay | Admitting: Cardiology

## 2019-07-02 DIAGNOSIS — E1142 Type 2 diabetes mellitus with diabetic polyneuropathy: Secondary | ICD-10-CM | POA: Diagnosis not present

## 2019-07-02 MED ORDER — ISOSORBIDE MONONITRATE ER 30 MG PO TB24: 30.0000 mg | ORAL_TABLET | Freq: Every day | ORAL | 3 refills | Status: DC

## 2019-07-02 MED ORDER — ROSUVASTATIN CALCIUM 10 MG PO TABS: 10.0000 mg | ORAL_TABLET | Freq: Every day | ORAL | 3 refills | Status: DC

## 2019-07-02 NOTE — Telephone Encounter (Signed)
*  STAT* If patient is at the pharmacy, call can be transferred to refill team.   1. Which medications need to be refilled? (please list name of each medication and dose if known)  rosuvastatin (CRESTOR) 10 MG tablet isosorbide mononitrate (IMDUR) 30 MG 24 hr tablet  2. Which pharmacy/location (including street and city if local pharmacy) is medication to be sent to?  [facility] [address], [place] - [address]  3. Do they need a 30 day or 90 day supply? 30   Patient said that she can have [facility] mail her medications to her and she will need new rx sent there. [facility] will not mail them to her

## 2019-07-03 DIAGNOSIS — E1142 Type 2 diabetes mellitus with diabetic polyneuropathy: Secondary | ICD-10-CM | POA: Diagnosis not present

## 2019-07-06 DIAGNOSIS — E1142 Type 2 diabetes mellitus with diabetic polyneuropathy: Secondary | ICD-10-CM | POA: Diagnosis not present

## 2019-07-07 DIAGNOSIS — E1142 Type 2 diabetes mellitus with diabetic polyneuropathy: Secondary | ICD-10-CM | POA: Diagnosis not present

## 2019-07-08 DIAGNOSIS — E1142 Type 2 diabetes mellitus with diabetic polyneuropathy: Secondary | ICD-10-CM | POA: Diagnosis not present

## 2019-07-09 DIAGNOSIS — H25813 Combined forms of age-related cataract, bilateral: Secondary | ICD-10-CM | POA: Diagnosis not present

## 2019-07-09 DIAGNOSIS — E113593 Type 2 diabetes mellitus with proliferative diabetic retinopathy without macular edema, bilateral: Secondary | ICD-10-CM | POA: Diagnosis not present

## 2019-07-09 DIAGNOSIS — H20011 Primary iridocyclitis, right eye: Secondary | ICD-10-CM | POA: Diagnosis not present

## 2019-07-09 DIAGNOSIS — E1142 Type 2 diabetes mellitus with diabetic polyneuropathy: Secondary | ICD-10-CM | POA: Diagnosis not present

## 2019-07-10 DIAGNOSIS — E1142 Type 2 diabetes mellitus with diabetic polyneuropathy: Secondary | ICD-10-CM | POA: Diagnosis not present

## 2019-07-13 DIAGNOSIS — H20011 Primary iridocyclitis, right eye: Secondary | ICD-10-CM | POA: Diagnosis not present

## 2019-07-13 DIAGNOSIS — E1142 Type 2 diabetes mellitus with diabetic polyneuropathy: Secondary | ICD-10-CM | POA: Diagnosis not present

## 2019-07-14 DIAGNOSIS — E1142 Type 2 diabetes mellitus with diabetic polyneuropathy: Secondary | ICD-10-CM | POA: Diagnosis not present

## 2019-07-15 DIAGNOSIS — E1142 Type 2 diabetes mellitus with diabetic polyneuropathy: Secondary | ICD-10-CM | POA: Diagnosis not present

## 2019-07-16 DIAGNOSIS — E1142 Type 2 diabetes mellitus with diabetic polyneuropathy: Secondary | ICD-10-CM | POA: Diagnosis not present

## 2019-07-17 ENCOUNTER — Ambulatory Visit: Payer: Medicaid Other | Admitting: Cardiology

## 2019-07-17 DIAGNOSIS — E1142 Type 2 diabetes mellitus with diabetic polyneuropathy: Secondary | ICD-10-CM | POA: Diagnosis not present

## 2019-07-20 DIAGNOSIS — E1142 Type 2 diabetes mellitus with diabetic polyneuropathy: Secondary | ICD-10-CM | POA: Diagnosis not present

## 2019-07-21 ENCOUNTER — Telehealth: Payer: Self-pay | Admitting: Internal Medicine

## 2019-07-21 DIAGNOSIS — E1142 Type 2 diabetes mellitus with diabetic polyneuropathy: Secondary | ICD-10-CM | POA: Diagnosis not present

## 2019-07-21 NOTE — Telephone Encounter (Signed)
Will forward to pcp

## 2019-07-21 NOTE — Telephone Encounter (Signed)
Patient called saying she was told that she needs a referral form her PCP in order to check and see if she has any COVID antibodies. Patient also stated that she has her COVID vaccine scheduled for Friday, 5/7.

## 2019-07-22 DIAGNOSIS — H20011 Primary iridocyclitis, right eye: Secondary | ICD-10-CM | POA: Diagnosis not present

## 2019-07-22 DIAGNOSIS — E1142 Type 2 diabetes mellitus with diabetic polyneuropathy: Secondary | ICD-10-CM | POA: Diagnosis not present

## 2019-07-22 NOTE — Telephone Encounter (Signed)
Contacted pt to go over Dr. Johnson response pt is aware and doesn't have any questions or concerns  

## 2019-07-23 ENCOUNTER — Other Ambulatory Visit: Payer: Self-pay

## 2019-07-23 ENCOUNTER — Ambulatory Visit (INDEPENDENT_AMBULATORY_CARE_PROVIDER_SITE_OTHER): Payer: Medicaid Other | Admitting: Cardiology

## 2019-07-23 ENCOUNTER — Encounter: Payer: Self-pay | Admitting: Cardiology

## 2019-07-23 VITALS — BP 164/90 | HR 88 | Ht 68.0 in | Wt 231.0 lb

## 2019-07-23 DIAGNOSIS — R9439 Abnormal result of other cardiovascular function study: Secondary | ICD-10-CM | POA: Diagnosis not present

## 2019-07-23 DIAGNOSIS — I10 Essential (primary) hypertension: Secondary | ICD-10-CM

## 2019-07-23 DIAGNOSIS — E118 Type 2 diabetes mellitus with unspecified complications: Secondary | ICD-10-CM | POA: Diagnosis not present

## 2019-07-23 DIAGNOSIS — Z7189 Other specified counseling: Secondary | ICD-10-CM

## 2019-07-23 DIAGNOSIS — R0609 Other forms of dyspnea: Secondary | ICD-10-CM

## 2019-07-23 DIAGNOSIS — I2510 Atherosclerotic heart disease of native coronary artery without angina pectoris: Secondary | ICD-10-CM | POA: Diagnosis not present

## 2019-07-23 DIAGNOSIS — E782 Mixed hyperlipidemia: Secondary | ICD-10-CM | POA: Diagnosis not present

## 2019-07-23 DIAGNOSIS — E1142 Type 2 diabetes mellitus with diabetic polyneuropathy: Secondary | ICD-10-CM | POA: Diagnosis not present

## 2019-07-23 DIAGNOSIS — R0600 Dyspnea, unspecified: Secondary | ICD-10-CM

## 2019-07-23 NOTE — Patient Instructions (Signed)

## 2019-07-23 NOTE — Progress Notes (Signed)
Cardiology Office Note:    Date:  07/23/2019   ID:  Lisa Olson, DOB Jul 23, 1960, MRN [id number]  PCP:  [name], MD  Cardiologist:  Buford Dresser, MD  Referring MD: [name], MD   CC: follow up.  History of Present Illness:    Lisa Olson is a 59 y.o. 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 I initially saw her 06/03/19 as a new consult at the request of [name], MD for the evaluation and management of chest pain.  Today: Reviewed note from Coletta Memos from 07/01/19, at which time plan was for medical management of abnormal stress test.  Reviewed her medication list. Has occasional headaches, not sure if it is from her eye issues or imdur, but she thinks more related to her eye issues.   No more discomfort in her chest. Breathing is stable, though she wears two masks and a shield whenever she is out, which limits her some.  Brought her blood pressure cuff today. She has seen numbers [id number] systolic at home.Cuff checked here, appears to be too small based on the index markers. 163/80 on cuff today, matches our numbers. Has never seen numbers this high before. Reports being stressed.   Denies chest pain, shortness of breath at rest or with normal exertion. No PND, orthopnea, LE edema or unexpected weight gain. No syncope or palpitations.  Past Medical History:  Diagnosis Date  . Diabetes mellitus without complication ([place])   . Hypertension   . Legally blind   . 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 EYE SURGERY    . LASIK    . RETINAL DETACHMENT SURGERY      Current Medications: Current Outpatient Medications on File Prior to Visit  Medication Sig  . Accu-Chek Softclix Lancets lancets Use as instructed  . amLODipine (NORVASC) 5 MG tablet Take 1 tablet (5 mg total) by mouth daily.  . Ascorbic  Acid (VITAMIN C ER PO) Take by mouth.  . Blood Glucose Monitoring Suppl (ACCU-CHEK AVIVA PLUS) w/Device KIT Use as directed  . carvedilol (COREG) 3.125 MG tablet Take 1 tablet (3.125 mg total) by mouth 2 (two) times daily with a meal.  . ferrous sulfate 325 (65 FE) MG tablet Take 1 tablet (325 mg total) by mouth daily with breakfast.  . furosemide (LASIX) 20 MG tablet Take 1 tablet (20 mg total) by mouth daily. Stop Hydrochlorothiazide  . glimepiride (AMARYL) 4 MG tablet Take 1 tablet (4 mg total) by mouth 2 (two) times daily.  Marland Kitchen glucose blood test strip USE 1 STRIP TO CHECK GLUCOSE THREE TIMES DAILY AS DIRECTED  . isosorbide mononitrate (IMDUR) 30 MG 24 hr tablet Take 1 tablet (30 mg total) by mouth daily.  Elmore Guise Devices (ACCU-CHEK SOFTCLIX) lancets Use as instructed  . lisinopril (ZESTRIL) 30 MG tablet Take 1 tablet (30 mg total) by mouth daily.  . metFORMIN (GLUCOPHAGE) 1000 MG tablet TAKE 1 TABLET BY MOUTH TWICE DAILY WITH A MEAL .  Marland Kitchen Multiple Vitamins-Minerals (MULTIVITAMIN WITH MINERALS) tablet Take 1 tablet by mouth daily.  . rosuvastatin (CRESTOR) 10 MG tablet Take 1 tablet (10 mg total) by mouth daily.   No current facility-administered medications on file prior to visit.     Allergies:   Patient has no known allergies.   Social History   Tobacco Use  .  Smoking status: Never Smoker  . Smokeless tobacco: Never Used  Substance Use Topics  . Alcohol use: Not on file  . Drug use: No    Family History: family history includes Diabetes in her father and mother; Hypertension in her sister.  ROS:   Please see the history of present illness.  Additional pertinent ROS unremarkable.   EKGs/Labs/Other Studies Reviewed:    The following studies were reviewed today: Nuclear stress 06/17/19  Nuclear stress EF: 70%.  The left ventricular ejection fraction is hyperdynamic (>65%).  There was no ST segment deviation noted during stress.  Findings consistent with ischemia.   This is an intermediate risk study.   Moderate, reversible, medium size defect in the apical septum and anteroseptum, and the mid anterior wall, that returns to normal at rest. Findings consistent with ischemia.   Echo 09/04/18 1. The left ventricle has normal systolic function with an ejection  fraction of 60-65%. The cavity size was normal. There is mildly increased  left ventricular wall thickness. Left ventricular diastolic Doppler  parameters are consistent with impaired  relaxation. Indeterminate filling pressures The E/e' is 8-15.  2. The right ventricle has normal systolic function. The cavity was  normal. There is no increase in right ventricular wall thickness.  3. The mitral valve is abnormal. Mild thickening of the mitral valve  leaflet. There is mild mitral annular calcification present. No evidence  of mitral valve stenosis.  4. The tricuspid valve is grossly normal.  5. The aortic valve is abnormal. Mild sclerosis of the aortic valve. No  stenosis of the aortic valve.  6. The aortic root and ascending aorta are normal in size and structure.   SUMMARY    Technically difficult study. Reduced echo windows. LVEF 60-65%, mild  LVH, normal wall motion, grade 1 DD, indeterminate LV filling  pressure, normal biatrial size, trivial MR, normal IVC, no  pericardial effusion.   EKG:  EKG is personally reviewed.  The ekg ordered 06/03/19 demonstrates NSR, nonspecific inferolateral t wave flattening/inversion  Recent Labs: 05/25/2019: ALT 14; BNP 14.7; BUN 18; Creatinine, Ser 0.91; Hemoglobin 10.8; Platelets 270; Potassium 4.2; Sodium 138  Recent Lipid Panel    Component Value Date/Time   CHOL 175 05/25/2019 1530   TRIG 211 (H) 05/25/2019 1530   HDL 42 05/25/2019 1530   CHOLHDL 4.2 05/25/2019 1530   CHOLHDL 4.7 05/14/2016 1607   VLDL 44 (H) 05/14/2016 1607   LDLCALC 97 05/25/2019 1530    Physical Exam:    VS:  BP (!) 164/90   Pulse 88   Ht '5\' 8"'$  (1.727 m)   Wt 231  lb (104.8 kg)   SpO2 96%   BMI 35.12 kg/m     Wt Readings from Last 3 Encounters:  07/23/19 231 lb (104.8 kg)  07/01/19 231 lb 12.8 oz (105.1 kg)  06/16/19 232 lb (105.2 kg)    GEN: Well nourished, well developed in no acute distress HEENT: Normal, moist mucous membranes NECK: No JVD CARDIAC: regular rhythm, normal S1 and S2, no rubs or gallops. No murmur. VASCULAR: Radial and DP pulses 2+ bilaterally. No carotid bruits RESPIRATORY:  Clear to auscultation without rales, wheezing or rhonchi  ABDOMEN: Soft, non-tender, non-distended MUSCULOSKELETAL:  Ambulates independently SKIN: Warm and dry, no edema NEUROLOGIC:  Alert and oriented x 3. Visually impaired. PSYCHIATRIC:  Normal affect   ASSESSMENT:    1. Abnormal stress test   2. DOE (dyspnea on exertion)   3. Coronary artery disease involving native coronary  artery of native heart without angina pectoris   4. Mixed hyperlipidemia   5. Type 2 diabetes mellitus with complication, without long-term current use of insulin ([place])   6. Essential hypertension   7. Cardiac risk counseling    PLAN:    Nonspecific chest pain, dyspnea on exertion: Lexiscan with moderate reversible , consistent with CAD -prefers medical management -tolerating imdur, no further chest discomfort -changed to rosuvastatin, recheck lipids/LFTs in 3 mos  Mixed hyperlipidemia: Ldl 97, TG 211 -changed to rosuvastatin, as above -goal LDL <70 given abnormal stress test  Hypertension: goal <130/80. Elevated today -home BP cuff matches office -she reports being very stressed today, numbers at home well controlled. -continue amlodipine '5mg'$ , carvedilol 3.125 mg BID, lisinopril 30 mg daily, imdur 30 mg daily  Type II diabetes: A1c 7.8, on metformin and glimeperide. Not on insulin -complicated by neuropathy and retinopathy, legally blind -consider SGLT2i/GLP1RA.   Cardiac risk counseling and prevention recommendations: -recommend heart  healthy/Mediterranean diet, with whole grains, fruits, vegetable, fish, lean meats, nuts, and olive oil. Limit salt. -limited exercise ability -recommend avoidance of tobacco products. Avoid excess alcohol. -ASCVD risk score: The 10-year ASCVD risk score Mikey Bussing DC Brooke Bonito., et al., 2013) is: 30.6%   Values used to calculate the score:     Age: 20 years     Sex: Female     Is Non-Hispanic African American: Yes     Diabetic: Yes     Tobacco smoker: No     Systolic Blood Pressure: 096 mmHg     Is BP treated: Yes     HDL Cholesterol: 42 mg/dL     Total Cholesterol: 175 mg/dL    Plan for follow up: 3 mos, recheck lipids/LFTs at that time  Buford Dresser, MD, PhD [place]  [facility]    Medication Adjustments/Labs and Tests Ordered: Current medicines are reviewed at length with the patient today.  Concerns regarding medicines are outlined above.  No orders of the defined types were placed in this encounter.  No orders of the defined types were placed in this encounter.   Patient Instructions  Medication Instructions:  Your Physician recommend you continue on your current medication as directed.    *If you need a refill on your cardiac medications before your next appointment, please call your pharmacy*   Lab Work: None   Testing/Procedures: None   Follow-Up: At [facility], you and your health needs are our priority.  As part of our continuing mission to provide you with exceptional heart care, we have created designated Provider Care Teams.  These Care Teams include your primary Cardiologist (physician) and Advanced Practice Providers (APPs -  Physician Assistants and Nurse Practitioners) who all work together to provide you with the care you need, when you need it.  We recommend signing up for the patient portal called "MyChart".  Sign up information is provided on this After Visit Summary.  MyChart is used to connect with patients for Virtual Visits  (Telemedicine).  Patients are able to view lab/test results, encounter notes, upcoming appointments, etc.  Non-urgent messages can be sent to your provider as well.   To learn more about what you can do with MyChart, go to [email or web address].    Your next appointment:   3 month(s)  The format for your next appointment:   In Person  Provider:   Buford Dresser, MD       Signed, Buford Dresser, MD PhD 07/23/2019 12:14 PM    [place]  [facility] 

## 2019-07-24 DIAGNOSIS — E1142 Type 2 diabetes mellitus with diabetic polyneuropathy: Secondary | ICD-10-CM | POA: Diagnosis not present

## 2019-07-27 DIAGNOSIS — E1142 Type 2 diabetes mellitus with diabetic polyneuropathy: Secondary | ICD-10-CM | POA: Diagnosis not present

## 2019-07-28 DIAGNOSIS — E1142 Type 2 diabetes mellitus with diabetic polyneuropathy: Secondary | ICD-10-CM | POA: Diagnosis not present

## 2019-07-29 DIAGNOSIS — E1142 Type 2 diabetes mellitus with diabetic polyneuropathy: Secondary | ICD-10-CM | POA: Diagnosis not present

## 2019-07-30 DIAGNOSIS — E1142 Type 2 diabetes mellitus with diabetic polyneuropathy: Secondary | ICD-10-CM | POA: Diagnosis not present

## 2019-07-31 DIAGNOSIS — E1142 Type 2 diabetes mellitus with diabetic polyneuropathy: Secondary | ICD-10-CM | POA: Diagnosis not present

## 2019-08-03 DIAGNOSIS — E1142 Type 2 diabetes mellitus with diabetic polyneuropathy: Secondary | ICD-10-CM | POA: Diagnosis not present

## 2019-08-04 DIAGNOSIS — E1142 Type 2 diabetes mellitus with diabetic polyneuropathy: Secondary | ICD-10-CM | POA: Diagnosis not present

## 2019-08-05 DIAGNOSIS — E1142 Type 2 diabetes mellitus with diabetic polyneuropathy: Secondary | ICD-10-CM | POA: Diagnosis not present

## 2019-08-06 DIAGNOSIS — E1142 Type 2 diabetes mellitus with diabetic polyneuropathy: Secondary | ICD-10-CM | POA: Diagnosis not present

## 2019-08-07 DIAGNOSIS — E1142 Type 2 diabetes mellitus with diabetic polyneuropathy: Secondary | ICD-10-CM | POA: Diagnosis not present

## 2019-08-10 DIAGNOSIS — E1142 Type 2 diabetes mellitus with diabetic polyneuropathy: Secondary | ICD-10-CM | POA: Diagnosis not present

## 2019-08-11 DIAGNOSIS — E1142 Type 2 diabetes mellitus with diabetic polyneuropathy: Secondary | ICD-10-CM | POA: Diagnosis not present

## 2019-08-12 DIAGNOSIS — H20011 Primary iridocyclitis, right eye: Secondary | ICD-10-CM | POA: Diagnosis not present

## 2019-08-12 DIAGNOSIS — E1142 Type 2 diabetes mellitus with diabetic polyneuropathy: Secondary | ICD-10-CM | POA: Diagnosis not present

## 2019-08-13 DIAGNOSIS — E1142 Type 2 diabetes mellitus with diabetic polyneuropathy: Secondary | ICD-10-CM | POA: Diagnosis not present

## 2019-08-14 DIAGNOSIS — E1142 Type 2 diabetes mellitus with diabetic polyneuropathy: Secondary | ICD-10-CM | POA: Diagnosis not present

## 2019-08-17 DIAGNOSIS — E1142 Type 2 diabetes mellitus with diabetic polyneuropathy: Secondary | ICD-10-CM | POA: Diagnosis not present

## 2019-08-18 DIAGNOSIS — E1142 Type 2 diabetes mellitus with diabetic polyneuropathy: Secondary | ICD-10-CM | POA: Diagnosis not present

## 2019-08-19 DIAGNOSIS — E1142 Type 2 diabetes mellitus with diabetic polyneuropathy: Secondary | ICD-10-CM | POA: Diagnosis not present

## 2019-08-20 DIAGNOSIS — E1142 Type 2 diabetes mellitus with diabetic polyneuropathy: Secondary | ICD-10-CM | POA: Diagnosis not present

## 2019-08-21 DIAGNOSIS — E1142 Type 2 diabetes mellitus with diabetic polyneuropathy: Secondary | ICD-10-CM | POA: Diagnosis not present

## 2019-08-24 DIAGNOSIS — E1142 Type 2 diabetes mellitus with diabetic polyneuropathy: Secondary | ICD-10-CM | POA: Diagnosis not present

## 2019-08-25 DIAGNOSIS — E1142 Type 2 diabetes mellitus with diabetic polyneuropathy: Secondary | ICD-10-CM | POA: Diagnosis not present

## 2019-08-26 DIAGNOSIS — E1142 Type 2 diabetes mellitus with diabetic polyneuropathy: Secondary | ICD-10-CM | POA: Diagnosis not present

## 2019-08-27 DIAGNOSIS — E1142 Type 2 diabetes mellitus with diabetic polyneuropathy: Secondary | ICD-10-CM | POA: Diagnosis not present

## 2019-08-28 DIAGNOSIS — E1142 Type 2 diabetes mellitus with diabetic polyneuropathy: Secondary | ICD-10-CM | POA: Diagnosis not present

## 2019-08-31 DIAGNOSIS — E1142 Type 2 diabetes mellitus with diabetic polyneuropathy: Secondary | ICD-10-CM | POA: Diagnosis not present

## 2019-09-01 DIAGNOSIS — E1142 Type 2 diabetes mellitus with diabetic polyneuropathy: Secondary | ICD-10-CM | POA: Diagnosis not present

## 2019-09-02 DIAGNOSIS — E1142 Type 2 diabetes mellitus with diabetic polyneuropathy: Secondary | ICD-10-CM | POA: Diagnosis not present

## 2019-09-02 DIAGNOSIS — E113593 Type 2 diabetes mellitus with proliferative diabetic retinopathy without macular edema, bilateral: Secondary | ICD-10-CM | POA: Diagnosis not present

## 2019-09-02 DIAGNOSIS — H20011 Primary iridocyclitis, right eye: Secondary | ICD-10-CM | POA: Diagnosis not present

## 2019-09-02 DIAGNOSIS — H25813 Combined forms of age-related cataract, bilateral: Secondary | ICD-10-CM | POA: Diagnosis not present

## 2019-09-03 DIAGNOSIS — E1142 Type 2 diabetes mellitus with diabetic polyneuropathy: Secondary | ICD-10-CM | POA: Diagnosis not present

## 2019-09-04 DIAGNOSIS — E1142 Type 2 diabetes mellitus with diabetic polyneuropathy: Secondary | ICD-10-CM | POA: Diagnosis not present

## 2019-09-14 DIAGNOSIS — E1142 Type 2 diabetes mellitus with diabetic polyneuropathy: Secondary | ICD-10-CM | POA: Diagnosis not present

## 2019-09-15 DIAGNOSIS — E1142 Type 2 diabetes mellitus with diabetic polyneuropathy: Secondary | ICD-10-CM | POA: Diagnosis not present

## 2019-09-16 ENCOUNTER — Ambulatory Visit: Payer: Medicaid Other | Admitting: Podiatry

## 2019-09-16 ENCOUNTER — Other Ambulatory Visit: Payer: Self-pay

## 2019-09-16 DIAGNOSIS — M79674 Pain in right toe(s): Secondary | ICD-10-CM

## 2019-09-16 DIAGNOSIS — E1142 Type 2 diabetes mellitus with diabetic polyneuropathy: Secondary | ICD-10-CM | POA: Diagnosis not present

## 2019-09-16 DIAGNOSIS — B351 Tinea unguium: Secondary | ICD-10-CM | POA: Diagnosis not present

## 2019-09-16 DIAGNOSIS — M79675 Pain in left toe(s): Secondary | ICD-10-CM

## 2019-09-16 DIAGNOSIS — E119 Type 2 diabetes mellitus without complications: Secondary | ICD-10-CM

## 2019-09-16 NOTE — Progress Notes (Signed)
This patient returns to my office for at risk foot care.  This patient requires this care by a professional since this patient will be at risk due to having type 2 diabetes.    This patient is unable to cut nails herself since the patient cannot reach her nails.These nails are painful walking and wearing shoes.  This patient presents for at risk foot care today.  General Appearance  Alert, conversant and in no acute stress.  Vascular  Dorsalis pedis and posterior tibial  pulses are  Weakly palpable  bilaterally.  Capillary return is within normal limits  bilaterally. Temperature is within normal limits  bilaterally.  Neurologic  Senn-Weinstein monofilament wire test within normal limits  bilaterally. Muscle power within normal limits bilaterally.  Nails Thick disfigured discolored nails with subungual debris  from hallux to fifth toes bilaterally. No evidence of bacterial infection or drainage bilaterally.  Orthopedic  No limitations of motion  feet .  No crepitus or effusions noted.  No bony pathology or digital deformities noted. Mild  HAV  B/L.  Skin  normotropic skin with no porokeratosis noted bilaterally.  No signs of infections or ulcers noted.     Onychomycosis  Pain in right toes  Pain in left toes  Consent was obtained for treatment procedures.   Mechanical debridement of nails 1-5  bilaterally performed with a nail nipper.  Filed with dremel without incident. Patient to make an appointment with the pedorthist about diabetic shoes.   Return office visit  3 months                    Told patient to return for periodic foot care and evaluation due to potential at risk complications.   Helane Gunther DPM

## 2019-09-18 ENCOUNTER — Other Ambulatory Visit: Payer: Self-pay | Admitting: Internal Medicine

## 2019-09-18 NOTE — Telephone Encounter (Signed)
Requested Prescriptions  Pending Prescriptions Disp Refills  . glucose blood test strip [Pharmacy Med Name: Accu-Chek Aviva Plus In Vitro Strip] 100 each 11    Sig: USE 1 STRIP TO CHECK GLUCOSE THREE TIMES DAILY AS DIRECTED     Endocrinology: Diabetes - Testing Supplies Passed - 09/18/2019  3:43 PM      Passed - Valid encounter within last 12 months    Recent Outpatient Visits          3 months ago Controlled type 2 diabetes mellitus with complication, without long-term current use of insulin (HCC)   [place] [facility] [place], Gavin Pound B, MD   9 months ago Controlled type 2 diabetes mellitus with complication, without long-term current use of insulin (HCC)   [place] [facility] Marcine Matar, MD   1 year ago Essential hypertension   [place] [facility] Jonah Blue B, MD   1 year ago Uncontrolled type 2 diabetes mellitus with complication, without long-term current use of insulin [facility])   [place] [facility] Marcine Matar, MD   1 year ago Essential hypertension   [place] [facility] Marcine Matar, MD      Future Appointments            In 1 month Jodelle Red, MD [facility] [place], [facility]

## 2019-09-24 ENCOUNTER — Telehealth: Payer: Self-pay | Admitting: Podiatry

## 2019-09-24 NOTE — Telephone Encounter (Signed)
Pt called and left number to call her back.  I returned call and she is asking if we can fax the paperwork(RX/office note) to [place] in [place]. I told pt I would have to talk with Dr Stacie Acres and let her know tomorrow.

## 2019-10-19 DIAGNOSIS — E1142 Type 2 diabetes mellitus with diabetic polyneuropathy: Secondary | ICD-10-CM | POA: Diagnosis not present

## 2019-10-20 DIAGNOSIS — E1142 Type 2 diabetes mellitus with diabetic polyneuropathy: Secondary | ICD-10-CM | POA: Diagnosis not present

## 2019-10-21 DIAGNOSIS — E1142 Type 2 diabetes mellitus with diabetic polyneuropathy: Secondary | ICD-10-CM | POA: Diagnosis not present

## 2019-10-22 DIAGNOSIS — E1142 Type 2 diabetes mellitus with diabetic polyneuropathy: Secondary | ICD-10-CM | POA: Diagnosis not present

## 2019-10-23 DIAGNOSIS — E1142 Type 2 diabetes mellitus with diabetic polyneuropathy: Secondary | ICD-10-CM | POA: Diagnosis not present

## 2019-10-26 DIAGNOSIS — E1142 Type 2 diabetes mellitus with diabetic polyneuropathy: Secondary | ICD-10-CM | POA: Diagnosis not present

## 2019-10-27 ENCOUNTER — Telehealth: Payer: Self-pay

## 2019-10-27 DIAGNOSIS — E1142 Type 2 diabetes mellitus with diabetic polyneuropathy: Secondary | ICD-10-CM | POA: Diagnosis not present

## 2019-10-27 NOTE — Telephone Encounter (Signed)
Updated PCS ICD 10 codes faxed to [facility]

## 2019-10-28 ENCOUNTER — Encounter: Payer: Self-pay | Admitting: Cardiology

## 2019-10-28 ENCOUNTER — Ambulatory Visit (INDEPENDENT_AMBULATORY_CARE_PROVIDER_SITE_OTHER): Payer: Medicaid Other | Admitting: Cardiology

## 2019-10-28 ENCOUNTER — Other Ambulatory Visit: Payer: Self-pay

## 2019-10-28 VITALS — BP 134/84 | HR 60 | Ht 68.0 in | Wt 231.8 lb

## 2019-10-28 DIAGNOSIS — I10 Essential (primary) hypertension: Secondary | ICD-10-CM

## 2019-10-28 DIAGNOSIS — E782 Mixed hyperlipidemia: Secondary | ICD-10-CM

## 2019-10-28 DIAGNOSIS — I2510 Atherosclerotic heart disease of native coronary artery without angina pectoris: Secondary | ICD-10-CM | POA: Diagnosis not present

## 2019-10-28 DIAGNOSIS — Z7189 Other specified counseling: Secondary | ICD-10-CM

## 2019-10-28 DIAGNOSIS — E118 Type 2 diabetes mellitus with unspecified complications: Secondary | ICD-10-CM

## 2019-10-28 DIAGNOSIS — E1142 Type 2 diabetes mellitus with diabetic polyneuropathy: Secondary | ICD-10-CM | POA: Diagnosis not present

## 2019-10-28 NOTE — Progress Notes (Signed)
Cardiology Office Note:    Date:  10/28/2019   ID:  [name], DOB [birth year], MRN [id number]  PCP:  [name], MD  Cardiologist:  Buford Dresser, MD  Referring MD: [name], MD   CC: follow up.  History of Present Illness:    Lisa Olson is a 59 y.o. 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 I initially saw her 06/03/19 as a new consult at the request of [name], MD for the evaluation and management of chest pain.  Today: Walking more, not short of breath any longer. No chest pain.  ROS positive for leg cramps first thing in the morning when she wakes up, especially inner thighs. Improves with mustard. Nonexertional.  Denies chest pain, shortness of breath at rest or with normal exertion. No PND, orthopnea, LE edema or unexpected weight gain. No syncope or palpitations.  Has received Covid vaccine. Has been very cautious around other people, tries to remain safe. Blood pressures well controlled at home, no log today.  Past Medical History:  Diagnosis Date  . Diabetes mellitus without complication ([place])   . Hypertension   . Legally blind   . 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 EYE SURGERY    . LASIK    . RETINAL DETACHMENT SURGERY      Current Medications: Current Outpatient Medications on File Prior to Visit  Medication Sig  . Accu-Chek Softclix Lancets lancets Use as instructed  . Ascorbic Acid (VITAMIN C ER PO) Take by mouth.  . Blood Glucose Monitoring Suppl (ACCU-CHEK AVIVA PLUS) w/Device KIT Use as directed  . carvedilol (COREG) 3.125 MG tablet Take 1 tablet (3.125 mg total) by mouth 2 (two) times daily with a meal.  . ferrous sulfate 325 (65 FE) MG tablet Take 1 tablet (325 mg total) by mouth daily with breakfast.  . furosemide (LASIX) 20 MG tablet Take 1 tablet (20 mg total) by  mouth daily. Stop Hydrochlorothiazide  . glimepiride (AMARYL) 4 MG tablet Take 1 tablet (4 mg total) by mouth 2 (two) times daily.  Marland Kitchen glucose blood test strip USE 1 STRIP TO CHECK GLUCOSE THREE TIMES DAILY AS DIRECTED  . isosorbide mononitrate (IMDUR) 30 MG 24 hr tablet Take 1 tablet (30 mg total) by mouth daily.  Elmore Guise Devices (ACCU-CHEK SOFTCLIX) lancets Use as instructed  . lisinopril (ZESTRIL) 30 MG tablet Take 1 tablet (30 mg total) by mouth daily.  . metFORMIN (GLUCOPHAGE) 1000 MG tablet TAKE 1 TABLET BY MOUTH TWICE DAILY WITH A MEAL .  Marland Kitchen Multiple Vitamins-Minerals (MULTIVITAMIN WITH MINERALS) tablet Take 1 tablet by mouth daily.  . rosuvastatin (CRESTOR) 10 MG tablet Take 1 tablet (10 mg total) by mouth daily.   No current facility-administered medications on file prior to visit.     Allergies:   Patient has no known allergies.   Social History   Tobacco Use  . Smoking status: Never Smoker  . Smokeless tobacco: Never Used  Vaping Use  . Vaping Use: Never used  Substance Use Topics  . Alcohol use: Not on file  . Drug use: No    Family History: family history includes Diabetes in her father and mother; Hypertension in her sister.  ROS:   Please see the history of present illness.  Additional pertinent ROS unremarkable.   EKGs/Labs/Other Studies Reviewed:  The following studies were reviewed today: Nuclear stress 06/17/19  Nuclear stress EF: 70%.  The left ventricular ejection fraction is hyperdynamic (>65%).  There was no ST segment deviation noted during stress.  Findings consistent with ischemia.  This is an intermediate risk study.   Moderate, reversible, medium size defect in the apical septum and anteroseptum, and the mid anterior wall, that returns to normal at rest. Findings consistent with ischemia.   Echo 09/04/18 1. The left ventricle has normal systolic function with an ejection  fraction of 60-65%. The cavity size was normal. There is mildly  increased  left ventricular wall thickness. Left ventricular diastolic Doppler  parameters are consistent with impaired  relaxation. Indeterminate filling pressures The E/e' is 8-15.  2. The right ventricle has normal systolic function. The cavity was  normal. There is no increase in right ventricular wall thickness.  3. The mitral valve is abnormal. Mild thickening of the mitral valve  leaflet. There is mild mitral annular calcification present. No evidence  of mitral valve stenosis.  4. The tricuspid valve is grossly normal.  5. The aortic valve is abnormal. Mild sclerosis of the aortic valve. No  stenosis of the aortic valve.  6. The aortic root and ascending aorta are normal in size and structure.   SUMMARY    Technically difficult study. Reduced echo windows. LVEF 60-65%, mild  LVH, normal wall motion, grade 1 DD, indeterminate LV filling  pressure, normal biatrial size, trivial MR, normal IVC, no  pericardial effusion.   EKG:  EKG is personally reviewed.  The ekg ordered 06/03/19 demonstrates NSR, nonspecific inferolateral t wave flattening/inversion  Recent Labs: 05/25/2019: ALT 14; BNP 14.7; BUN 18; Creatinine, Ser 0.91; Hemoglobin 10.8; Platelets 270; Potassium 4.2; Sodium 138  Recent Lipid Panel    Component Value Date/Time   CHOL 175 05/25/2019 1530   TRIG 211 (H) 05/25/2019 1530   HDL 42 05/25/2019 1530   CHOLHDL 4.2 05/25/2019 1530   CHOLHDL 4.7 05/14/2016 1607   VLDL 44 (H) 05/14/2016 1607   LDLCALC 97 05/25/2019 1530    Physical Exam:    VS:  BP 134/84 (BP Location: Left Arm, Patient Position: Sitting, Cuff Size: Large)   Pulse 60   Ht '5\' 8"'$  (1.727 m)   Wt 231 lb 12.8 oz (105.1 kg)   BMI 35.25 kg/m     Wt Readings from Last 3 Encounters:  10/28/19 231 lb 12.8 oz (105.1 kg)  07/23/19 231 lb (104.8 kg)  07/01/19 231 lb 12.8 oz (105.1 kg)    GEN: Well nourished, well developed in no acute distress HEENT: Normal, moist mucous membranes NECK: No  JVD CARDIAC: regular rhythm, normal S1 and S2, no rubs or gallops. No murmur. VASCULAR: Radial and DP pulses 2+ bilaterally. No carotid bruits RESPIRATORY:  Clear to auscultation without rales, wheezing or rhonchi  ABDOMEN: Soft, non-tender, non-distended MUSCULOSKELETAL:  Ambulates independently SKIN: Warm and dry, no edema NEUROLOGIC:  Alert and oriented x 3. Legally blind. PSYCHIATRIC:  Normal affect   ASSESSMENT:    1. Coronary artery disease involving native coronary artery of native heart without angina pectoris   2. Mixed hyperlipidemia   3. Essential hypertension   4. Type 2 diabetes mellitus with complication, without long-term current use of insulin (HCC)   5. Cardiac risk counseling   6. Counseling on health promotion and disease prevention    PLAN:    Nonspecific chest pain, dyspnea on exertion: now resolved -Lexiscan with moderate reversible , consistent with CAD -  discussed options, elected medical management and doing well with this. -tolerating imdur, no further chest discomfort -tolerating rosuvastatin  Mixed hyperlipidemia: Ldl 97, TG 211 -goal LDL <70 given abnormal stress test -tolerating change to rosuvastatin.  -recheck lipids at follow up if not done prior  Hypertension: goal <130/80. Just above goal today -home BP cuff matches office, checked previously -reports home numbers well controlled, but no log today -no longer on amlodipine 5 mg daily. Unclear when this was stopped. -continue carvedilol 3.125 mg BID, lisinopril 30 mg daily, imdur 30 mg daily -on furosemide 20 mg daily, no longer on thiazide  Type II diabetes: A1c 7.8 per KPN, on metformin and glimeperide. Not on insulin -complicated by neuropathy and retinopathy, legally blind -consider SGLT2i/GLP1RA given presumed CAD  Cardiac risk counseling and prevention recommendations: -recommend heart healthy/Mediterranean diet, with whole grains, fruits, vegetable, fish, lean meats, nuts, and  olive oil. Limit salt. -limited exercise ability -recommend avoidance of tobacco products. Avoid excess alcohol. -ASCVD risk score: The 10-year ASCVD risk score Mikey Bussing DC Brooke Bonito., et al., 2013) is: 18.2%   Values used to calculate the score:     Age: 34 years     Sex: Female     Is Non-Hispanic African American: Yes     Diabetic: Yes     Tobacco smoker: No     Systolic Blood Pressure: 509 mmHg     Is BP treated: Yes     HDL Cholesterol: 42 mg/dL     Total Cholesterol: 175 mg/dL    Plan for follow up: 6 mos or sooner PRN  Buford Dresser, MD, PhD [place]  [facility]    Medication Adjustments/Labs and Tests Ordered: Current medicines are reviewed at length with the patient today.  Concerns regarding medicines are outlined above.  No orders of the defined types were placed in this encounter.  No orders of the defined types were placed in this encounter.   Patient Instructions  Medication Instructions:  Your Physician recommend you continue on your current medication as directed.    *If you need a refill on your cardiac medications before your next appointment, please call your pharmacy*   Lab Work: None    Testing/Procedures: None   Follow-Up: At [facility], you and your health needs are our priority.  As part of our continuing mission to provide you with exceptional heart care, we have created designated Provider Care Teams.  These Care Teams include your primary Cardiologist (physician) and Advanced Practice Providers (APPs -  Physician Assistants and Nurse Practitioners) who all work together to provide you with the care you need, when you need it.  We recommend signing up for the patient portal called "MyChart".  Sign up information is provided on this After Visit Summary.  MyChart is used to connect with patients for Virtual Visits (Telemedicine).  Patients are able to view lab/test results, encounter notes, upcoming appointments, etc.  Non-urgent  messages can be sent to your provider as well.   To learn more about what you can do with MyChart, go to [email or web address].    Your next appointment:   6 month(s)  The format for your next appointment:   In Person  Provider:   Buford Dresser, MD       Signed, Buford Dresser, MD PhD 10/28/2019  [place]

## 2019-10-28 NOTE — Patient Instructions (Signed)

## 2019-10-29 ENCOUNTER — Encounter: Payer: Self-pay | Admitting: Cardiology

## 2019-10-29 DIAGNOSIS — E1142 Type 2 diabetes mellitus with diabetic polyneuropathy: Secondary | ICD-10-CM | POA: Diagnosis not present

## 2019-10-30 DIAGNOSIS — E1142 Type 2 diabetes mellitus with diabetic polyneuropathy: Secondary | ICD-10-CM | POA: Diagnosis not present

## 2019-11-02 DIAGNOSIS — E1142 Type 2 diabetes mellitus with diabetic polyneuropathy: Secondary | ICD-10-CM | POA: Diagnosis not present

## 2019-11-03 DIAGNOSIS — E1142 Type 2 diabetes mellitus with diabetic polyneuropathy: Secondary | ICD-10-CM | POA: Diagnosis not present

## 2019-11-04 DIAGNOSIS — E1142 Type 2 diabetes mellitus with diabetic polyneuropathy: Secondary | ICD-10-CM | POA: Diagnosis not present

## 2019-11-05 ENCOUNTER — Telehealth: Payer: Self-pay

## 2019-11-05 DIAGNOSIS — E1142 Type 2 diabetes mellitus with diabetic polyneuropathy: Secondary | ICD-10-CM | POA: Diagnosis not present

## 2019-11-05 NOTE — Telephone Encounter (Signed)
Updated ICD 10 codes faxed to [employer]. The patient now has [facility] managed medicaid plan who oversees her PCS.    Call placed to the patient who stated that she continues to receive 10 hours PCS/week.

## 2019-11-06 DIAGNOSIS — E1142 Type 2 diabetes mellitus with diabetic polyneuropathy: Secondary | ICD-10-CM | POA: Diagnosis not present

## 2019-11-09 DIAGNOSIS — E1142 Type 2 diabetes mellitus with diabetic polyneuropathy: Secondary | ICD-10-CM | POA: Diagnosis not present

## 2019-11-10 DIAGNOSIS — E1142 Type 2 diabetes mellitus with diabetic polyneuropathy: Secondary | ICD-10-CM | POA: Diagnosis not present

## 2019-11-11 DIAGNOSIS — E1142 Type 2 diabetes mellitus with diabetic polyneuropathy: Secondary | ICD-10-CM | POA: Diagnosis not present

## 2019-11-12 DIAGNOSIS — E1142 Type 2 diabetes mellitus with diabetic polyneuropathy: Secondary | ICD-10-CM | POA: Diagnosis not present

## 2019-11-13 DIAGNOSIS — E1142 Type 2 diabetes mellitus with diabetic polyneuropathy: Secondary | ICD-10-CM | POA: Diagnosis not present

## 2019-11-16 DIAGNOSIS — E1142 Type 2 diabetes mellitus with diabetic polyneuropathy: Secondary | ICD-10-CM | POA: Diagnosis not present

## 2019-11-17 DIAGNOSIS — E1142 Type 2 diabetes mellitus with diabetic polyneuropathy: Secondary | ICD-10-CM | POA: Diagnosis not present

## 2019-11-18 DIAGNOSIS — E1142 Type 2 diabetes mellitus with diabetic polyneuropathy: Secondary | ICD-10-CM | POA: Diagnosis not present

## 2019-11-19 DIAGNOSIS — E1142 Type 2 diabetes mellitus with diabetic polyneuropathy: Secondary | ICD-10-CM | POA: Diagnosis not present

## 2019-11-20 DIAGNOSIS — E1142 Type 2 diabetes mellitus with diabetic polyneuropathy: Secondary | ICD-10-CM | POA: Diagnosis not present

## 2019-11-23 DIAGNOSIS — E1142 Type 2 diabetes mellitus with diabetic polyneuropathy: Secondary | ICD-10-CM | POA: Diagnosis not present

## 2019-11-24 DIAGNOSIS — E1142 Type 2 diabetes mellitus with diabetic polyneuropathy: Secondary | ICD-10-CM | POA: Diagnosis not present

## 2019-11-25 DIAGNOSIS — E1142 Type 2 diabetes mellitus with diabetic polyneuropathy: Secondary | ICD-10-CM | POA: Diagnosis not present

## 2019-11-26 DIAGNOSIS — E1142 Type 2 diabetes mellitus with diabetic polyneuropathy: Secondary | ICD-10-CM | POA: Diagnosis not present

## 2019-11-27 DIAGNOSIS — E1142 Type 2 diabetes mellitus with diabetic polyneuropathy: Secondary | ICD-10-CM | POA: Diagnosis not present

## 2019-11-30 DIAGNOSIS — E1142 Type 2 diabetes mellitus with diabetic polyneuropathy: Secondary | ICD-10-CM | POA: Diagnosis not present

## 2019-12-01 ENCOUNTER — Telehealth: Payer: Self-pay

## 2019-12-01 DIAGNOSIS — E1142 Type 2 diabetes mellitus with diabetic polyneuropathy: Secondary | ICD-10-CM | POA: Diagnosis not present

## 2019-12-01 NOTE — Telephone Encounter (Signed)
Call placed to Laura/[employer] regarding PCS # [phone].  She confirmed that the patient continues to received personal care services since the changeover to managed medicaid.

## 2019-12-02 DIAGNOSIS — E1142 Type 2 diabetes mellitus with diabetic polyneuropathy: Secondary | ICD-10-CM | POA: Diagnosis not present

## 2019-12-03 DIAGNOSIS — E1142 Type 2 diabetes mellitus with diabetic polyneuropathy: Secondary | ICD-10-CM | POA: Diagnosis not present

## 2019-12-04 DIAGNOSIS — E1142 Type 2 diabetes mellitus with diabetic polyneuropathy: Secondary | ICD-10-CM | POA: Diagnosis not present

## 2019-12-07 DIAGNOSIS — E1142 Type 2 diabetes mellitus with diabetic polyneuropathy: Secondary | ICD-10-CM | POA: Diagnosis not present

## 2019-12-08 DIAGNOSIS — E1142 Type 2 diabetes mellitus with diabetic polyneuropathy: Secondary | ICD-10-CM | POA: Diagnosis not present

## 2019-12-09 DIAGNOSIS — E1142 Type 2 diabetes mellitus with diabetic polyneuropathy: Secondary | ICD-10-CM | POA: Diagnosis not present

## 2019-12-10 DIAGNOSIS — E1142 Type 2 diabetes mellitus with diabetic polyneuropathy: Secondary | ICD-10-CM | POA: Diagnosis not present

## 2019-12-11 DIAGNOSIS — E1142 Type 2 diabetes mellitus with diabetic polyneuropathy: Secondary | ICD-10-CM | POA: Diagnosis not present

## 2019-12-14 ENCOUNTER — Telehealth: Payer: Self-pay | Admitting: Internal Medicine

## 2019-12-14 DIAGNOSIS — E1142 Type 2 diabetes mellitus with diabetic polyneuropathy: Secondary | ICD-10-CM | POA: Diagnosis not present

## 2019-12-14 NOTE — Telephone Encounter (Signed)
Copied from CRM [id number]. Topic: General - Other >> Dec 11, 2019  3:37 PM Marylen Ponto wrote: Reason for CRM: Pt requests to speak with someone regarding her account. Pt stated she recently made a payment and then received a call stating she has a balance and  requested payment. Pt request call back

## 2019-12-14 NOTE — Telephone Encounter (Signed)
I return Pt call and LVM to contact the Billing dept. At [phone] since her at [facility] we don't have one

## 2019-12-15 DIAGNOSIS — E1142 Type 2 diabetes mellitus with diabetic polyneuropathy: Secondary | ICD-10-CM | POA: Diagnosis not present

## 2019-12-16 ENCOUNTER — Ambulatory Visit: Payer: Medicaid Other | Admitting: Podiatry

## 2019-12-16 DIAGNOSIS — E1142 Type 2 diabetes mellitus with diabetic polyneuropathy: Secondary | ICD-10-CM | POA: Diagnosis not present

## 2019-12-17 DIAGNOSIS — E1142 Type 2 diabetes mellitus with diabetic polyneuropathy: Secondary | ICD-10-CM | POA: Diagnosis not present

## 2019-12-18 DIAGNOSIS — E1142 Type 2 diabetes mellitus with diabetic polyneuropathy: Secondary | ICD-10-CM | POA: Diagnosis not present

## 2019-12-21 DIAGNOSIS — E1142 Type 2 diabetes mellitus with diabetic polyneuropathy: Secondary | ICD-10-CM | POA: Diagnosis not present

## 2019-12-22 DIAGNOSIS — E1142 Type 2 diabetes mellitus with diabetic polyneuropathy: Secondary | ICD-10-CM | POA: Diagnosis not present

## 2019-12-23 DIAGNOSIS — E1142 Type 2 diabetes mellitus with diabetic polyneuropathy: Secondary | ICD-10-CM | POA: Diagnosis not present

## 2019-12-24 DIAGNOSIS — E1142 Type 2 diabetes mellitus with diabetic polyneuropathy: Secondary | ICD-10-CM | POA: Diagnosis not present

## 2019-12-25 DIAGNOSIS — E1142 Type 2 diabetes mellitus with diabetic polyneuropathy: Secondary | ICD-10-CM | POA: Diagnosis not present

## 2019-12-28 DIAGNOSIS — E1142 Type 2 diabetes mellitus with diabetic polyneuropathy: Secondary | ICD-10-CM | POA: Diagnosis not present

## 2019-12-29 DIAGNOSIS — E1142 Type 2 diabetes mellitus with diabetic polyneuropathy: Secondary | ICD-10-CM | POA: Diagnosis not present

## 2019-12-30 DIAGNOSIS — E1142 Type 2 diabetes mellitus with diabetic polyneuropathy: Secondary | ICD-10-CM | POA: Diagnosis not present

## 2019-12-31 DIAGNOSIS — E1142 Type 2 diabetes mellitus with diabetic polyneuropathy: Secondary | ICD-10-CM | POA: Diagnosis not present

## 2020-01-01 DIAGNOSIS — E1142 Type 2 diabetes mellitus with diabetic polyneuropathy: Secondary | ICD-10-CM | POA: Diagnosis not present

## 2020-01-04 DIAGNOSIS — E1142 Type 2 diabetes mellitus with diabetic polyneuropathy: Secondary | ICD-10-CM | POA: Diagnosis not present

## 2020-01-05 DIAGNOSIS — E1142 Type 2 diabetes mellitus with diabetic polyneuropathy: Secondary | ICD-10-CM | POA: Diagnosis not present

## 2020-01-06 DIAGNOSIS — E1142 Type 2 diabetes mellitus with diabetic polyneuropathy: Secondary | ICD-10-CM | POA: Diagnosis not present

## 2020-01-07 ENCOUNTER — Other Ambulatory Visit: Payer: Self-pay | Admitting: Internal Medicine

## 2020-01-07 DIAGNOSIS — E118 Type 2 diabetes mellitus with unspecified complications: Secondary | ICD-10-CM

## 2020-01-07 DIAGNOSIS — E1142 Type 2 diabetes mellitus with diabetic polyneuropathy: Secondary | ICD-10-CM | POA: Diagnosis not present

## 2020-01-08 DIAGNOSIS — E1142 Type 2 diabetes mellitus with diabetic polyneuropathy: Secondary | ICD-10-CM | POA: Diagnosis not present

## 2020-01-11 DIAGNOSIS — E1142 Type 2 diabetes mellitus with diabetic polyneuropathy: Secondary | ICD-10-CM | POA: Diagnosis not present

## 2020-01-12 DIAGNOSIS — E1142 Type 2 diabetes mellitus with diabetic polyneuropathy: Secondary | ICD-10-CM | POA: Diagnosis not present

## 2020-01-13 DIAGNOSIS — E1142 Type 2 diabetes mellitus with diabetic polyneuropathy: Secondary | ICD-10-CM | POA: Diagnosis not present

## 2020-01-14 DIAGNOSIS — E1142 Type 2 diabetes mellitus with diabetic polyneuropathy: Secondary | ICD-10-CM | POA: Diagnosis not present

## 2020-01-26 ENCOUNTER — Other Ambulatory Visit: Payer: Self-pay | Admitting: Internal Medicine

## 2020-01-26 DIAGNOSIS — I10 Essential (primary) hypertension: Secondary | ICD-10-CM

## 2020-01-26 NOTE — Telephone Encounter (Signed)
Called pt and LM on VM to call office to schedule appt. Requested Prescriptions  Pending Prescriptions Disp Refills  . lisinopril (ZESTRIL) 30 MG tablet [Pharmacy Med Name: Lisinopril 30 MG Oral Tablet] 30 tablet 0    Sig: Take 1 tablet by mouth once daily     Cardiovascular:  ACE Inhibitors Failed - 01/26/2020  5:15 PM      Failed - Cr in normal range and within 180 days    Creat  Date Value Ref Range Status  02/13/2016 0.93 0.50 - 1.05 mg/dL Final    Comment:      For patients > or = 60 years of age: The upper reference limit for Creatinine is approximately 13% higher for people identified as African-American.      Creatinine, Ser  Date Value Ref Range Status  05/25/2019 0.91 0.57 - 1.00 mg/dL Final   Creatinine, Urine  Date Value Ref Range Status  05/14/2016 227 20 - 320 mg/dL Final         Failed - K in normal range and within 180 days    Potassium  Date Value Ref Range Status  05/25/2019 4.2 3.5 - 5.2 mmol/L Final         Failed - Valid encounter within last 6 months    Recent Outpatient Visits          8 months ago Controlled type 2 diabetes mellitus with complication, without long-term current use of insulin (HCC)   [place] [facility] [place], Gavin Pound B, MD   1 year ago Controlled type 2 diabetes mellitus with complication, without long-term current use of insulin (HCC)   [place] [facility] Marcine Matar, MD   1 year ago Essential hypertension   [place] [facility] Jonah Blue B, MD   1 year ago Uncontrolled type 2 diabetes mellitus with complication, without long-term current use of insulin [facility])   [place] [facility] Marcine Matar, MD   1 year ago Essential hypertension    [place] [facility] Marcine Matar, MD             Passed - Patient is not pregnant      Passed - Last BP in normal range    BP Readings from Last 1  Encounters:  10/28/19 134/84

## 2020-02-22 ENCOUNTER — Telehealth: Payer: Self-pay | Admitting: Internal Medicine

## 2020-02-22 DIAGNOSIS — I10 Essential (primary) hypertension: Secondary | ICD-10-CM

## 2020-02-22 DIAGNOSIS — E118 Type 2 diabetes mellitus with unspecified complications: Secondary | ICD-10-CM

## 2020-02-22 MED ORDER — GLIMEPIRIDE 4 MG PO TABS: 4.0000 mg | ORAL_TABLET | Freq: Two times a day (BID) | ORAL | 1 refills | Status: DC

## 2020-02-22 MED ORDER — METFORMIN HCL 1000 MG PO TABS: 1000.0000 mg | ORAL_TABLET | Freq: Two times a day (BID) | ORAL | 1 refills | Status: DC

## 2020-02-22 MED ORDER — LISINOPRIL 30 MG PO TABS: 30.0000 mg | ORAL_TABLET | Freq: Every day | ORAL | 1 refills | Status: DC

## 2020-02-22 NOTE — Telephone Encounter (Signed)
Pt request refill  metFORMIN (GLUCOPHAGE) 1000 MG tablet lisinopril (ZESTRIL) 30 MG tablet glimepiride (AMARYL) 4 MG tablet  [id number] - [place], [place] - [address] Phone:  [phone]  Fax:  [phone]      Pt made appt with her PCP first available is Apr 14, 2020.

## 2020-02-24 ENCOUNTER — Other Ambulatory Visit: Payer: Self-pay | Admitting: Internal Medicine

## 2020-02-24 DIAGNOSIS — I10 Essential (primary) hypertension: Secondary | ICD-10-CM

## 2020-02-25 ENCOUNTER — Other Ambulatory Visit: Payer: Self-pay

## 2020-02-25 DIAGNOSIS — E118 Type 2 diabetes mellitus with unspecified complications: Secondary | ICD-10-CM

## 2020-02-25 DIAGNOSIS — I10 Essential (primary) hypertension: Secondary | ICD-10-CM

## 2020-02-25 MED ORDER — GLIMEPIRIDE 4 MG PO TABS: 4.0000 mg | ORAL_TABLET | Freq: Two times a day (BID) | ORAL | 1 refills | Status: DC

## 2020-02-25 MED ORDER — LISINOPRIL 30 MG PO TABS: 30.0000 mg | ORAL_TABLET | Freq: Every day | ORAL | 1 refills | Status: DC

## 2020-02-25 MED ORDER — METFORMIN HCL 1000 MG PO TABS: 1000.0000 mg | ORAL_TABLET | Freq: Two times a day (BID) | ORAL | 1 refills | Status: DC

## 2020-02-25 NOTE — Telephone Encounter (Signed)
Pts medications were sent to the wrong pharmacy / Pt asked for Rx to be sent to [facility] on [address]   Metformin Glimepiride and  Lisinopril

## 2020-02-25 NOTE — Telephone Encounter (Signed)
Rxs has been resent to [facility] on [address]

## 2020-03-01 ENCOUNTER — Encounter: Payer: Self-pay | Admitting: Podiatry

## 2020-03-01 ENCOUNTER — Ambulatory Visit (INDEPENDENT_AMBULATORY_CARE_PROVIDER_SITE_OTHER): Payer: Medicaid Other | Admitting: Podiatry

## 2020-03-01 ENCOUNTER — Other Ambulatory Visit: Payer: Self-pay

## 2020-03-01 DIAGNOSIS — M79674 Pain in right toe(s): Secondary | ICD-10-CM

## 2020-03-01 DIAGNOSIS — M79675 Pain in left toe(s): Secondary | ICD-10-CM | POA: Diagnosis not present

## 2020-03-01 DIAGNOSIS — E119 Type 2 diabetes mellitus without complications: Secondary | ICD-10-CM

## 2020-03-01 DIAGNOSIS — B351 Tinea unguium: Secondary | ICD-10-CM | POA: Diagnosis not present

## 2020-03-01 NOTE — Progress Notes (Signed)
This patient returns to my office for at risk foot care.  This patient requires this care by a professional since this patient will be at risk due to having type 2 diabetes.    This patient is unable to cut nails herself since the patient cannot reach her nails.These nails are painful walking and wearing shoes.  This patient presents for at risk foot care today.  General Appearance  Alert, conversant and in no acute stress.  Vascular  Dorsalis pedis and posterior tibial  pulses are  Weakly palpable  bilaterally.  Capillary return is within normal limits  bilaterally. Temperature is within normal limits  bilaterally.  Neurologic  Senn-Weinstein monofilament wire test within normal limits  bilaterally. Muscle power within normal limits bilaterally.  Nails Thick disfigured discolored nails with subungual debris  from hallux to fifth toes bilaterally. No evidence of bacterial infection or drainage bilaterally.  Orthopedic  No limitations of motion  feet .  No crepitus or effusions noted.  No bony pathology or digital deformities noted. Mild  HAV  B/L.  Skin  normotropic skin with no porokeratosis noted bilaterally.  No signs of infections or ulcers noted.     Onychomycosis  Pain in right toes  Pain in left toes  Consent was obtained for treatment procedures.   Mechanical debridement of nails 1-5  bilaterally performed with a nail nipper.  Filed with dremel without incident.    Return office visit  3 months                    Told patient to return for periodic foot care and evaluation due to potential at risk complications.   Tondalaya Perren DPM  

## 2020-04-12 ENCOUNTER — Other Ambulatory Visit: Payer: Self-pay | Admitting: Internal Medicine

## 2020-04-12 DIAGNOSIS — Z1231 Encounter for screening mammogram for malignant neoplasm of breast: Secondary | ICD-10-CM

## 2020-04-14 ENCOUNTER — Ambulatory Visit: Payer: Medicaid Other | Attending: Internal Medicine | Admitting: Internal Medicine

## 2020-04-14 ENCOUNTER — Other Ambulatory Visit: Payer: Self-pay | Admitting: Pharmacist Clinician (PhC)/ Clinical Pharmacy Specialist

## 2020-04-14 ENCOUNTER — Encounter: Payer: Self-pay | Admitting: Internal Medicine

## 2020-04-14 ENCOUNTER — Other Ambulatory Visit: Payer: Self-pay

## 2020-04-14 VITALS — BP 160/84 | HR 90 | Temp 98.3°F | Resp 16 | Ht 68.0 in | Wt 231.4 lb

## 2020-04-14 DIAGNOSIS — Z1211 Encounter for screening for malignant neoplasm of colon: Secondary | ICD-10-CM | POA: Diagnosis not present

## 2020-04-14 DIAGNOSIS — I10 Essential (primary) hypertension: Secondary | ICD-10-CM

## 2020-04-14 DIAGNOSIS — E118 Type 2 diabetes mellitus with unspecified complications: Secondary | ICD-10-CM | POA: Diagnosis not present

## 2020-04-14 DIAGNOSIS — E669 Obesity, unspecified: Secondary | ICD-10-CM

## 2020-04-14 DIAGNOSIS — I2510 Atherosclerotic heart disease of native coronary artery without angina pectoris: Secondary | ICD-10-CM

## 2020-04-14 DIAGNOSIS — E113541 Type 2 diabetes mellitus with proliferative diabetic retinopathy with combined traction retinal detachment and rhegmatogenous retinal detachment, right eye: Secondary | ICD-10-CM

## 2020-04-14 DIAGNOSIS — E1169 Type 2 diabetes mellitus with other specified complication: Secondary | ICD-10-CM | POA: Insufficient documentation

## 2020-04-14 DIAGNOSIS — E785 Hyperlipidemia, unspecified: Secondary | ICD-10-CM

## 2020-04-14 DIAGNOSIS — Z2821 Immunization not carried out because of patient refusal: Secondary | ICD-10-CM

## 2020-04-14 DIAGNOSIS — H20011 Primary iridocyclitis, right eye: Secondary | ICD-10-CM | POA: Diagnosis not present

## 2020-04-14 LAB — GLUCOSE, POCT (MANUAL RESULT ENTRY): POC Glucose: 273 mg/dl — AB (ref 70–99)

## 2020-04-14 LAB — POCT GLYCOSYLATED HEMOGLOBIN (HGB A1C): HbA1c, POC (controlled diabetic range): 8.1 % — AB (ref 0.0–7.0)

## 2020-04-14 MED ORDER — CARVEDILOL 3.125 MG PO TABS: 3.1250 mg | ORAL_TABLET | Freq: Two times a day (BID) | ORAL | 3 refills | Status: DC

## 2020-04-14 MED ORDER — ROSUVASTATIN CALCIUM 10 MG PO TABS: 10.0000 mg | ORAL_TABLET | Freq: Every day | ORAL | 3 refills | Status: DC

## 2020-04-14 MED ORDER — ISOSORBIDE MONONITRATE ER 30 MG PO TB24: 30.0000 mg | ORAL_TABLET | Freq: Every day | ORAL | 3 refills | Status: DC

## 2020-04-14 NOTE — Patient Instructions (Signed)
Your blood pressure is not at goal.  Please take your medicines as soon as you return home today.  Your A1c today is 8.1.  Our goal is to get you less than 7.  I recommend taking the glimepiride 4 mg twice a day.  Continue Metformin.  When you go to see your cardiologist next month, please take all of your medicines with you so that they can do medication reconciliation with you on that visit.  You will get the Cologuard kit tests in the mail.  This is the screening test for colon cancer.  Once you get it please follow the instructions and then send it back via [employer].  This will be of no additional charge to you to mail it back.

## 2020-04-14 NOTE — Progress Notes (Signed)
Patient ID: Lisa Olson, female    DOB: 04-18-1960  MRN: [id number]  CC: Diabetes and Hypertension   Subjective: Lisa Olson is a 60 y.o. female who presents for chronic ds management Her concerns today include:  Patient with history ofDMtype II with associated peripheral neuropathy and retinopathy,legally blind in both eyes, HTN, HL, CAD (medical management), ACD  NW:[id number] 2 shots of Pfizer vaccine and plans to get booster.  Declines flu shot.  Reports she never received the cologuard test that was ordered last year  CAD/HTN: Saw Dr. Harrell Gave since last visit for evaluation of chest pain and dyspnea on exertion.  Had an abnormal stress test which suggested that she may have a blockage in one of her coronary vessels.  Patient opted for medical management rather than further invasive testing.  She is supposed to be on Crestor, carvedilol, isosorbide, lisinopril, furosemide. Reports compliance with medications.  Did not take meds as yet for the morning but plans to take them as soon as she returns home. No CP.  Some SOB when walking with mask on.  Not getting out to walk much any more due to weather.  We have had 2-3 small snow and she is afraid of slipping on black ice.  No LE edema, PND, orthopnea has follow-up appointment with cardiology on 05/13/2020  DIABETES TYPE 2 Last A1C:   Results for orders placed or performed in visit on 04/14/20  POCT glucose (manual entry)  Result Value Ref Range   POC Glucose 273 (A) 70 - 99 mg/dl  POCT glycosylated hemoglobin (Hb A1C)  Result Value Ref Range   Hemoglobin A1C     HbA1c POC (<> result, manual entry)     HbA1c, POC (prediabetic range)     HbA1c, POC (controlled diabetic range) 8.1 (A) 0.0 - 7.0 %    Med Adherence:  [x]  Yes taking Metformin BID and Amaryl.  She is on Amaryl 4 mg in the morning and half a tablet in the evening. Medication side effects:  []  Yes    []  No Home Monitoring?  [x]  Yes  3-4 x/day before means Home glucose  results range: BS 150-160.  Occasional 200 if she miss taking meds.  Did not bring meds with her Diet Adherence: [x]  Yes "I am watching meal sizes, I'm watching what I drink." Exercise: []  Yes    [x]  No Hypoglycemic episodes?: [x]  Yes occasionally in 70s Numbness of the feet? [x]  Yes    []  No Retinopathy hx? [x]  Yes    []  No Last eye exam: 06/2019 with Dr. Coralyn Pear.  Has appt with Dr. Schuyler Amor today.  I have given her my card so that she can have Dr. Carolynn Sayers send Lisa Olson his report after he sees her tomorrow. Comments: She saw her podiatrist Dr. Prudence Davidson last month and had her toenails clipped.  Patient Active Problem List   Diagnosis Date Noted  . Pain due to onychomycosis of toenails of both feet 09/16/2019  . Diabetes mellitus without complication ([place]) 65/78/4696  . Influenza vaccine refused 11/27/2018  . 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 Legal blindness 07/25/2017  . Type 2 diabetes mellitus with retinopathy of both eyes, with long-term current use of insulin ([place]) 02/14/2016  . Essential hypertension 02/14/2016  . Onychomycosis due to dermatophyte 03/25/2013  . Hallux valgus, acquired 03/25/2013     Current Outpatient Medications on File Prior to Visit  Medication Sig Dispense Refill  . Accu-Chek Softclix Lancets lancets Use as instructed 100 each 12  . Ascorbic Acid (VITAMIN C ER PO) Take by mouth.    . Blood Glucose Monitoring Suppl (ACCU-CHEK AVIVA PLUS) w/Device KIT Use as directed 1 kit 0  . carvedilol (COREG) 3.125 MG tablet Take 1 tablet (3.125 mg total) by mouth 2 (two) times daily with a meal. 60 tablet 6  . ferrous sulfate 325 (65 FE) MG tablet Take 1 tablet (325 mg total) by mouth daily with breakfast. 100 tablet 3  . furosemide (LASIX) 20 MG tablet Take 1 tablet (20 mg total) by mouth daily. Stop Hydrochlorothiazide 30 tablet 6  .  glimepiride (AMARYL) 4 MG tablet Take 1 tablet (4 mg total) by mouth 2 (two) times daily. 60 tablet 1  . glucose blood test strip USE 1 STRIP TO CHECK GLUCOSE THREE TIMES DAILY AS DIRECTED 100 each 11  . isosorbide mononitrate (IMDUR) 30 MG 24 hr tablet Take 1 tablet (30 mg total) by mouth daily. 90 tablet 3  . Lancet Devices (ACCU-CHEK SOFTCLIX) lancets Use as instructed 1 each 0  . lisinopril (ZESTRIL) 30 MG tablet Take 1 tablet (30 mg total) by mouth daily. 30 tablet 1  . metFORMIN (GLUCOPHAGE) 1000 MG tablet Take 1 tablet (1,000 mg total) by mouth 2 (two) times daily with a meal. TAKE 1 TABLET BY MOUTH TWICE DAILY WITH A MEAL 60 tablet 1  . Multiple Vitamins-Minerals (MULTIVITAMIN WITH MINERALS) tablet Take 1 tablet by mouth daily.    . rosuvastatin (CRESTOR) 10 MG tablet Take 1 tablet (10 mg total) by mouth daily. 90 tablet 3   No current facility-administered medications on file prior to visit.    No Known Allergies  Social History   Socioeconomic History  . Marital status: Single    Spouse name: Not on file  . Number of children: Not on file  . Years of education: Not on file  . Highest education level: Not on file  Occupational History  . Not on file  Tobacco Use  . Smoking status: Never Smoker  . Smokeless tobacco: Never Used  Vaping Use  . Vaping Use: Never used  Substance and Sexual Activity  . Alcohol use: Not on file  . Drug use: No  . Sexual activity: Not on file  Other Topics Concern  . Not on file  Social History Narrative  . Not on file   Social Determinants of Health   Financial Resource Strain: Not on file  Food Insecurity: Not on file  Transportation Needs: Not on file  Physical Activity: Not on file  Stress: Not on file  Social Connections: Not on file  Intimate Partner Violence: Not on file    Family History  Problem Relation Age of Onset  . Diabetes Mother   . Diabetes Father   . Hypertension Sister     Past Surgical History:  Procedure  Laterality Date  . EYE SURGERY    . LASIK    . RETINAL DETACHMENT SURGERY      ROS: Review of Systems Negative except as stated above  PHYSICAL EXAM: BP (!) 160/84   Pulse 90   Temp 98.3 F (36.8 C)   Resp 16   Ht 5' 8"  (1.727 m)   Wt 231 lb 6.4 oz (105 kg)   SpO2 98%   BMI 35.18 kg/m   Wt Readings from Last 3 Encounters:  04/14/20 231 lb 6.4 oz (105 kg)  10/28/19 231 lb  12.8 oz (105.1 kg)  07/23/19 231 lb (104.8 kg)    Physical Exam  General appearance - alert, well appearing, and in no distress Mental status - normal mood, behavior, speech, dress, motor activity, and thought processes Neck - supple, no significant adenopathy Chest - clear to auscultation, no wheezes, rales or rhonchi, symmetric air entry Heart - normal rate, regular rhythm, normal S1, S2, no murmurs, rubs, clicks or gallops Extremities -trace to 1+ lower extremity edema.   CMP Latest Ref Rng & Units 05/25/2019 06/03/2018 07/25/2017  Glucose 65 - 99 mg/dL 122(H) 202(H) 112(H)  BUN 8 - 27 mg/dL 18 23 14   Creatinine 0.57 - 1.00 mg/dL 0.91 0.88 0.72  Sodium 134 - 144 mmol/L 138 136 140  Potassium 3.5 - 5.2 mmol/L 4.2 4.4 4.1  Chloride 96 - 106 mmol/L 100 99 101  CO2 20 - 29 mmol/L 28 21 26   Calcium 8.7 - 10.3 mg/dL 9.1 9.0 9.3  Total Protein 6.0 - 8.5 g/dL 7.1 6.7 7.3  Total Bilirubin 0.0 - 1.2 mg/dL 0.3 0.3 <0.2  Alkaline Phos 39 - 117 IU/L 70 70 76  AST 0 - 40 IU/L 17 20 15   ALT 0 - 32 IU/L 14 17 14    Lipid Panel     Component Value Date/Time   CHOL 175 05/25/2019 1530   TRIG 211 (H) 05/25/2019 1530   HDL 42 05/25/2019 1530   CHOLHDL 4.2 05/25/2019 1530   CHOLHDL 4.7 05/14/2016 1607   VLDL 44 (H) 05/14/2016 1607   LDLCALC 97 05/25/2019 1530    CBC    Component Value Date/Time   WBC 5.5 05/25/2019 1530   WBC SEE NOTE 02/13/2016 1746   RBC 4.20 05/25/2019 1530   RBC CANCELED 02/13/2016 1746   HGB 10.8 (L) 05/25/2019 1530   HCT 33.3 (L) 05/25/2019 1530   PLT 270 05/25/2019 1530   MCV  79 05/25/2019 1530   MCH 25.7 (L) 05/25/2019 1530   MCH CANCELED 02/13/2016 1746   MCHC 32.4 05/25/2019 1530   MCHC CANCELED 02/13/2016 1746   RDW 12.8 05/25/2019 1530   LYMPHSABS CANCELED 02/13/2016 1746   MONOABS CANCELED 02/13/2016 1746   EOSABS CANCELED 02/13/2016 1746   BASOSABS CANCELED 02/13/2016 1746    ASSESSMENT AND PLAN: 1. Type 2 diabetes mellitus with obesity (HCC) A1c not at goal.  Continue Metformin.  I recommend increasing Amaryl to 4 mg twice a day.  She will let me know if she starts having episodes of hypoglycemia with blood sugars less than 80.  Encouraged her to continue healthy eating habits and to move as much as she can. - POCT glucose (manual entry) - POCT glycosylated hemoglobin (Hb A1C) - CBC - Comprehensive metabolic panel - Lipid panel - Microalbumin / creatinine urine ratio  2. Coronary artery disease involving native coronary artery of native heart without angina pectoris Stable.  Continue current medications including Lipitor, isosorbide and carvedilol  3. Essential hypertension Not at goal but she has not taken medicines as yet for the morning.  She will take them as soon as she returns home.  She sees her cardiology next month and blood pressure can be rechecked at that time.  I also told her to carry all of her medicines with her to that visit so that medication reconciliation can be done.  4. Hyperlipidemia associated with type 2 diabetes mellitus (HCC) Continue Crestor  5. 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 Carolynn Sayers  6. Influenza vaccine refused Recommended.  Patient declined.  7. Screening for colon cancer She is still wanting to do colon cancer screening with a Cologuard.  I have resubmitted the order. - Cologuard     Patient was given the opportunity to ask questions.   Patient verbalized understanding of the plan and was able to repeat key elements of the plan.   Orders Placed This Encounter  Procedures  . CBC  . Comprehensive metabolic panel  . Lipid panel  . Microalbumin / creatinine urine ratio  . Cologuard  . POCT glucose (manual entry)  . POCT glycosylated hemoglobin (Hb A1C)     Requested Prescriptions    No prescriptions requested or ordered in this encounter    Return in about 4 months (around 08/12/2020).  Karle Plumber, MD, FACP

## 2020-04-15 ENCOUNTER — Telehealth: Payer: Self-pay

## 2020-04-15 LAB — CBC
Hematocrit: 36.1 % (ref 34.0–46.6)
Hemoglobin: 11 g/dL — ABNORMAL LOW (ref 11.1–15.9)
MCH: 24.6 pg — ABNORMAL LOW (ref 26.6–33.0)
MCHC: 30.5 g/dL — ABNORMAL LOW (ref 31.5–35.7)
MCV: 81 fL (ref 79–97)
Platelets: 288 10*3/uL (ref 150–450)
RBC: 4.47 x10E6/uL (ref 3.77–5.28)
RDW: 12.8 % (ref 11.7–15.4)
WBC: 4.9 10*3/uL (ref 3.4–10.8)

## 2020-04-15 LAB — COMPREHENSIVE METABOLIC PANEL
ALT: 20 IU/L (ref 0–32)
AST: 15 IU/L (ref 0–40)
Albumin/Globulin Ratio: 1.2 (ref 1.2–2.2)
Albumin: 4.1 g/dL (ref 3.8–4.9)
Alkaline Phosphatase: 77 IU/L (ref 44–121)
BUN/Creatinine Ratio: 17 (ref 12–28)
BUN: 14 mg/dL (ref 8–27)
Bilirubin Total: 0.2 mg/dL (ref 0.0–1.2)
CO2: 26 mmol/L (ref 20–29)
Calcium: 9.2 mg/dL (ref 8.7–10.3)
Chloride: 99 mmol/L (ref 96–106)
Creatinine, Ser: 0.84 mg/dL (ref 0.57–1.00)
GFR calc Af Amer: 87 mL/min/{1.73_m2} (ref >59)
GFR calc non Af Amer: 76 mL/min/{1.73_m2} (ref >59)
Globulin, Total: 3.3 g/dL (ref 1.5–4.5)
Glucose: 240 mg/dL — ABNORMAL HIGH (ref 65–99)
Potassium: 4.3 mmol/L (ref 3.5–5.2)
Sodium: 138 mmol/L (ref 134–144)
Total Protein: 7.4 g/dL (ref 6.0–8.5)

## 2020-04-15 LAB — LIPID PANEL
Chol/HDL Ratio: 4.9 ratio — ABNORMAL HIGH (ref 0.0–4.4)
Cholesterol, Total: 197 mg/dL (ref 100–199)
HDL: 40 mg/dL (ref >39)
LDL Chol Calc (NIH): 117 mg/dL — ABNORMAL HIGH (ref 0–99)
Triglycerides: 229 mg/dL — ABNORMAL HIGH (ref 0–149)
VLDL Cholesterol Cal: 40 mg/dL (ref 5–40)

## 2020-04-15 LAB — MICROALBUMIN / CREATININE URINE RATIO
Creatinine, Urine: 186.2 mg/dL
Microalb/Creat Ratio: 18 mg/g creat (ref 0–29)
Microalbumin, Urine: 34.1 ug/mL

## 2020-04-15 NOTE — Telephone Encounter (Signed)
Contacted pt to go over lab results pt didn't answer lvm  

## 2020-04-15 NOTE — Progress Notes (Signed)
Let patient know that she has a mild but stable anemia.  Kidney and liver function tests are good.  LDL cholesterol is 117 with goal being less than 70.  High cholesterol can increase her risk for heart attack and strokes.  Please confirm that she had been taking the rosuvastatin also known as Crestor consistently for the past 2 months.  If she has been taking it every day for the past 2 months then we need to increase the dose from 10 mg daily to 20 mg daily to get her cholesterol better.  Please let me know

## 2020-04-24 DIAGNOSIS — Z1211 Encounter for screening for malignant neoplasm of colon: Secondary | ICD-10-CM | POA: Diagnosis not present

## 2020-04-24 LAB — COLOGUARD: Cologuard: NEGATIVE

## 2020-04-30 LAB — COLOGUARD
COLOGUARD: NEGATIVE
Cologuard: NEGATIVE

## 2020-04-30 LAB — EXTERNAL GENERIC LAB PROCEDURE: COLOGUARD: NEGATIVE

## 2020-05-04 ENCOUNTER — Telehealth: Payer: Self-pay | Admitting: Internal Medicine

## 2020-05-04 ENCOUNTER — Other Ambulatory Visit: Payer: Self-pay

## 2020-05-04 NOTE — Telephone Encounter (Signed)
Contacted pt and made aware  

## 2020-05-11 DIAGNOSIS — H2141 Pupillary membranes, right eye: Secondary | ICD-10-CM | POA: Diagnosis not present

## 2020-05-11 DIAGNOSIS — H20011 Primary iridocyclitis, right eye: Secondary | ICD-10-CM | POA: Diagnosis not present

## 2020-05-13 ENCOUNTER — Other Ambulatory Visit: Payer: Self-pay

## 2020-05-13 ENCOUNTER — Ambulatory Visit (INDEPENDENT_AMBULATORY_CARE_PROVIDER_SITE_OTHER): Payer: Medicaid Other | Admitting: Cardiology

## 2020-05-13 ENCOUNTER — Ambulatory Visit: Payer: Medicaid Other | Admitting: Cardiology

## 2020-05-13 ENCOUNTER — Encounter: Payer: Self-pay | Admitting: Cardiology

## 2020-05-13 VITALS — BP 142/80 | HR 80 | Ht 68.0 in | Wt 233.6 lb

## 2020-05-13 DIAGNOSIS — E782 Mixed hyperlipidemia: Secondary | ICD-10-CM

## 2020-05-13 DIAGNOSIS — I10 Essential (primary) hypertension: Secondary | ICD-10-CM | POA: Diagnosis not present

## 2020-05-13 DIAGNOSIS — R9439 Abnormal result of other cardiovascular function study: Secondary | ICD-10-CM

## 2020-05-13 DIAGNOSIS — E118 Type 2 diabetes mellitus with unspecified complications: Secondary | ICD-10-CM | POA: Diagnosis not present

## 2020-05-13 DIAGNOSIS — Z7189 Other specified counseling: Secondary | ICD-10-CM

## 2020-05-13 NOTE — Patient Instructions (Signed)

## 2020-05-13 NOTE — Progress Notes (Signed)
Cardiology Office Note:    Date:  05/13/2020   ID:  Lisa Olson, DOB [birth year], MRN [id number]  PCP:  [name], MD  Cardiologist:  Buford Dresser, MD  Referring MD: [name], MD   CC: follow up.  History of Present Illness:    Lisa Olson is a 60 y.o. 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 I initially saw her 06/03/19 as a new consult at the request of [name], MD for the evaluation and management of chest pain.  Today: Doing well from a heart perspective.  Has had more leg cramping recently, bilateral legs. Worse with laying down at night, doesn't bother her at all during the day. Better if she gets up and walks around at night. Helps when she takes mustard. Recent CMP was normal.  Has had both Covid shots, remains very cautious.  We discussed her recent labs. Admits that she doesn't take her statin all the time as it can "make me crazy." Discussed goals for cholesterol, options for improving numbers. Feels that rosuvastatin is better than atorvastatin but still has some issues. She will work on taking statin more routinely, has been cutting back on fatty and rich foods.  Discussed aspirin, but she has had bleeding behind her eyes on aspirin before. Given her limited residual sight, discussed pros/cons, elects not to pursue aspirin.  No chest pain, breathing is good. Tolerating medications without issue. Brings BP machine, notes that BP well controlled without high or low numbers.  Past Medical History:  Diagnosis Date  . Diabetes mellitus without complication ([place])   . Hypertension   . Legally blind   . 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 EYE SURGERY    . LASIK    . RETINAL DETACHMENT SURGERY      Current Medications: Current Outpatient Medications on File Prior to Visit  Medication  Sig  . Accu-Chek Softclix Lancets lancets Use as instructed  . Ascorbic Acid (VITAMIN C ER PO) Take by mouth.  . Blood Glucose Monitoring Suppl (ACCU-CHEK AVIVA PLUS) w/Device KIT Use as directed  . carvedilol (COREG) 3.125 MG tablet Take 1 tablet (3.125 mg total) by mouth 2 (two) times daily with a meal.  . ferrous sulfate 325 (65 FE) MG tablet Take 1 tablet (325 mg total) by mouth daily with breakfast.  . furosemide (LASIX) 20 MG tablet Take 1 tablet (20 mg total) by mouth daily. Stop Hydrochlorothiazide  . glimepiride (AMARYL) 4 MG tablet Take 1 tablet (4 mg total) by mouth 2 (two) times daily.  Marland Kitchen glucose blood test strip USE 1 STRIP TO CHECK GLUCOSE THREE TIMES DAILY AS DIRECTED  . isosorbide mononitrate (IMDUR) 30 MG 24 hr tablet Take 1 tablet (30 mg total) by mouth daily.  Elmore Guise Devices (ACCU-CHEK SOFTCLIX) lancets Use as instructed  . lisinopril (ZESTRIL) 30 MG tablet Take 1 tablet (30 mg total) by mouth daily.  . Multiple Vitamins-Minerals (MULTIVITAMIN WITH MINERALS) tablet Take 1 tablet by mouth daily.  . rosuvastatin (CRESTOR) 10 MG tablet Take 1 tablet (10 mg total) by mouth daily.  . DUREZOL 0.05 % EMUL Place 1 drop into the right eye 2 (two) times daily.  . metFORMIN (GLUCOPHAGE) 1000 MG tablet Take 1 tablet (1,000 mg total) by mouth 2 (two) times daily with a meal. TAKE 1 TABLET BY MOUTH TWICE  DAILY WITH A MEAL  . timolol (TIMOPTIC) 0.5 % ophthalmic solution 1 drop every morning.   No current facility-administered medications on file prior to visit.     Allergies:   Patient has no known allergies.   Social History   Tobacco Use  . Smoking status: Never Smoker  . Smokeless tobacco: Never Used  Vaping Use  . Vaping Use: Never used  Substance Use Topics  . Drug use: No    Family History: family history includes Diabetes in her father and mother; Hypertension in her sister.  ROS:   Please see the history of present illness.  Additional pertinent ROS  unremarkable.   EKGs/Labs/Other Studies Reviewed:    The following studies were reviewed today: Nuclear stress 06/17/19  Nuclear stress EF: 70%.  The left ventricular ejection fraction is hyperdynamic (>65%).  There was no ST segment deviation noted during stress.  Findings consistent with ischemia.  This is an intermediate risk study.   Moderate, reversible, medium size defect in the apical septum and anteroseptum, and the mid anterior wall, that returns to normal at rest. Findings consistent with ischemia.   Echo 09/04/18 1. The left ventricle has normal systolic function with an ejection  fraction of 60-65%. The cavity size was normal. There is mildly increased  left ventricular wall thickness. Left ventricular diastolic Doppler  parameters are consistent with impaired  relaxation. Indeterminate filling pressures The E/e' is 8-15.  2. The right ventricle has normal systolic function. The cavity was  normal. There is no increase in right ventricular wall thickness.  3. The mitral valve is abnormal. Mild thickening of the mitral valve  leaflet. There is mild mitral annular calcification present. No evidence  of mitral valve stenosis.  4. The tricuspid valve is grossly normal.  5. The aortic valve is abnormal. Mild sclerosis of the aortic valve. No  stenosis of the aortic valve.  6. The aortic root and ascending aorta are normal in size and structure.   SUMMARY    Technically difficult study. Reduced echo windows. LVEF 60-65%, mild  LVH, normal wall motion, grade 1 DD, indeterminate LV filling  pressure, normal biatrial size, trivial MR, normal IVC, no  pericardial effusion.   EKG:  EKG is personally reviewed.  The ekg ordered 06/03/19 demonstrates NSR, nonspecific inferolateral t wave flattening/inversion  Recent Labs: 05/25/2019: BNP 14.7 04/14/2020: ALT 20; BUN 14; Creatinine, Ser 0.84; Hemoglobin 11.0; Platelets 288; Potassium 4.3; Sodium 138  Recent Lipid Panel     Component Value Date/Time   CHOL 197 04/14/2020 1402   TRIG 229 (H) 04/14/2020 1402   HDL 40 04/14/2020 1402   CHOLHDL 4.9 (H) 04/14/2020 1402   CHOLHDL 4.7 05/14/2016 1607   VLDL 44 (H) 05/14/2016 1607   LDLCALC 117 (H) 04/14/2020 1402    Physical Exam:    VS:  BP (!) 142/80   Pulse 80   Ht $R'5\' 8"'Si$  (1.727 m)   Wt 233 lb 9.6 oz (106 kg)   SpO2 99%   BMI 35.52 kg/m     Wt Readings from Last 3 Encounters:  05/13/20 233 lb 9.6 oz (106 kg)  04/14/20 231 lb 6.4 oz (105 kg)  10/28/19 231 lb 12.8 oz (105.1 kg)    GEN: Well nourished, well developed in no acute distress HEENT: Normal, moist mucous membranes NECK: No JVD CARDIAC: regular rhythm, normal S1 and S2, no rubs or gallops. No murmur. VASCULAR: Radial and DP pulses 2+ bilaterally. No carotid bruits RESPIRATORY:  Clear to  auscultation without rales, wheezing or rhonchi  ABDOMEN: Soft, non-tender, non-distended MUSCULOSKELETAL:  Ambulates independently SKIN: Warm and dry, no edema NEUROLOGIC:  Alert and oriented x 3. Legally blind PSYCHIATRIC:  Normal affect   ASSESSMENT:    1. Essential hypertension   2. Mixed hyperlipidemia   3. Type 2 diabetes mellitus with complication, without long-term current use of insulin ([place])   4. Abnormal stress test   5. Cardiac risk counseling    PLAN:    Nonspecific chest pain, dyspnea on exertion: now resolved -Lexiscan with moderate reversible , consistent with CAD -discussed options, elected medical management and doing well with this. -tolerating imdur, no further chest discomfort -tolerating rosuvastatin, discussed dosing of this, below -no aspirin given prior retinal hemorrhages  Mixed hyperlipidemia:  -lipids 04/14/20 show LDL 117, above goal -goal LDL <70 given abnormal stress test -tolerating change to rosuvastatin, but not taking routinely -we discussed at length. She will work on taking this regularly and we will recheck lipids. She will call me if she cannot tolerate  taking daily. Did discuss that we may need to increase dose if not at goal  Hypertension: goal <130/80. Above goal today -home BP cuff matches office, checked previously -reports home numbers are well controlled. Brought BP cuff today but no log available -discussed options for management. After shared decision making, she wishes to continue current regimen and reassess at follow up -continue carvedilol 3.125 mg BID, lisinopril 30 mg daily, imdur 30 mg daily -on furosemide 20 mg daily  Type II diabetes: A1c 8.1 per [facility], on metformin and glimeperide. Not on insulin -complicated by neuropathy and retinopathy, legally blind -consider SGLT2i/GLP1RA given presumed CAD. Does not wish to change at this time, continue to address  Cardiac risk counseling and prevention recommendations: -recommend heart healthy/Mediterranean diet, with whole grains, fruits, vegetable, fish, lean meats, nuts, and olive oil. Limit salt. -limited exercise ability -recommend avoidance of tobacco products. Avoid excess alcohol. -ASCVD risk score: The 10-year ASCVD risk score Mikey Bussing DC Brooke Bonito., et al., 2013) is: 24.6%   Values used to calculate the score:     Age: 75 years     Sex: Female     Is Non-Hispanic African American: Yes     Diabetic: Yes     Tobacco smoker: No     Systolic Blood Pressure: 676 mmHg     Is BP treated: Yes     HDL Cholesterol: 40 mg/dL     Total Cholesterol: 197 mg/dL    Plan for follow up: 6 mos or sooner PRN  Buford Dresser, MD, PhD [place]  [facility]    Medication Adjustments/Labs and Tests Ordered: Current medicines are reviewed at length with the patient today.  Concerns regarding medicines are outlined above.  No orders of the defined types were placed in this encounter.  No orders of the defined types were placed in this encounter.   Patient Instructions  Medication Instructions:  Your Physician recommend you continue on your current medication as directed.     *If you need a refill on your cardiac medications before your next appointment, please call your pharmacy*   Lab Work: None  Testing/Procedures: None   Follow-Up: At [facility], you and your health needs are our priority.  As part of our continuing mission to provide you with exceptional heart care, we have created designated Provider Care Teams.  These Care Teams include your primary Cardiologist (physician) and Advanced Practice Providers (APPs -  Physician Assistants and Nurse Practitioners)  who all work together to provide you with the care you need, when you need it.  We recommend signing up for the patient portal called "MyChart".  Sign up information is provided on this After Visit Summary.  MyChart is used to connect with patients for Virtual Visits (Telemedicine).  Patients are able to view lab/test results, encounter notes, upcoming appointments, etc.  Non-urgent messages can be sent to your provider as well.   To learn more about what you can do with MyChart, go to [email or web address].    Your next appointment:   6 month(s)  The format for your next appointment:   In Person  Provider:   Buford Dresser, MD     Signed, Buford Dresser, MD PhD 05/13/2020  [place]

## 2020-05-15 ENCOUNTER — Encounter: Payer: Self-pay | Admitting: Cardiology

## 2020-05-19 NOTE — Progress Notes (Shared)
[facility] Note  05/23/2020     CHIEF COMPLAINT Patient presents for No chief complaint on file.   HISTORY OF PRESENT ILLNESS: Lisa Olson is a 60 y.o. female who presents to the clinic today for:   Pt states she is delayed to follow up due to being out of town and covid, she states she is back bc her PCP has been asking her if she has been to the eye dr, pt states she has adapted to her vision, she states she is on metformin and glimeperide, pt states she is registered with services for the blind, but with covid, they have not been much help, pt has not had cataract sx or seen Dr. Katy Fitch since her last visit here, pt did see Dr. Manuella Ghazi for a second opinion, she states he agreed with Dr. Dairl Ponder assessment of her vision, pt states she is not interested in having sx at this time   Referring physician: [name], MD [address],  [place] [id number]  HISTORICAL INFORMATION:   Selected notes from the MEDICAL RECORD NUMBER Referred by Dr. Wyatt Portela for DM exam LEE: 05.22.19 (S. Groat) [BCVA: OD: HM OS: HM]  Ocular Hx-Cataract OU, Diabetic Retinopathy OU, Retinal Vein Occlusion PMH-DM (last A1C 7.2, taking Metformin), HTN    CURRENT MEDICATIONS: Current Outpatient Medications (Ophthalmic Drugs)  Medication Sig  . DUREZOL 0.05 % EMUL Place 1 drop into the right eye 2 (two) times daily.  . timolol (TIMOPTIC) 0.5 % ophthalmic solution 1 drop every morning.   No current facility-administered medications for this visit. (Ophthalmic Drugs)   Current Outpatient Medications (Other)  Medication Sig  . Accu-Chek Softclix Lancets lancets Use as instructed  . Ascorbic Acid (VITAMIN C ER PO) Take by mouth.  . Blood Glucose Monitoring Suppl (ACCU-CHEK AVIVA PLUS) w/Device KIT Use as directed  . carvedilol (COREG) 3.125 MG tablet Take 1 tablet (3.125 mg total) by mouth 2 (two) times daily with a meal.  . ferrous sulfate 325 (65 FE) MG tablet Take 1  tablet (325 mg total) by mouth daily with breakfast.  . furosemide (LASIX) 20 MG tablet Take 1 tablet (20 mg total) by mouth daily. Stop Hydrochlorothiazide  . glimepiride (AMARYL) 4 MG tablet Take 1 tablet (4 mg total) by mouth 2 (two) times daily.  Marland Kitchen glucose blood test strip USE 1 STRIP TO CHECK GLUCOSE THREE TIMES DAILY AS DIRECTED  . isosorbide mononitrate (IMDUR) 30 MG 24 hr tablet Take 1 tablet (30 mg total) by mouth daily.  Elmore Guise Devices (ACCU-CHEK SOFTCLIX) lancets Use as instructed  . lisinopril (ZESTRIL) 30 MG tablet Take 1 tablet (30 mg total) by mouth daily.  . metFORMIN (GLUCOPHAGE) 1000 MG tablet Take 1 tablet (1,000 mg total) by mouth 2 (two) times daily with a meal. TAKE 1 TABLET BY MOUTH TWICE DAILY WITH A MEAL  . Multiple Vitamins-Minerals (MULTIVITAMIN WITH MINERALS) tablet Take 1 tablet by mouth daily.  . rosuvastatin (CRESTOR) 10 MG tablet Take 1 tablet (10 mg total) by mouth daily.   No current facility-administered medications for this visit. (Other)      REVIEW OF SYSTEMS:    ALLERGIES No Known Allergies  PAST MEDICAL HISTORY Past Medical History:  Diagnosis Date  . Diabetes mellitus without complication ([place])   . Hypertension   . Legally blind   . 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  EYE SURGERY    . LASIK    . RETINAL DETACHMENT SURGERY      FAMILY HISTORY Family History  Problem Relation Age of Onset  . Diabetes Mother   . Diabetes Father   . Hypertension Sister     SOCIAL HISTORY Social History   Tobacco Use  . Smoking status: Never Smoker  . Smokeless tobacco: Never Used  Vaping Use  . Vaping Use: Never used  Substance Use Topics  . Drug use: No         OPHTHALMIC EXAM:  Not recorded     IMAGING AND PROCEDURES  Imaging and Procedures for _0 @           ASSESSMENT/PLAN:    ICD-10-CM   1. 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 Macular hole of right eye  H35.341   3. Retinal detachment, tractional, right eye  H33.41   4. Stable proliferative diabetic retinopathy of left eye associated with type 2 diabetes mellitus ([place])  A63.0160   5. Retinal edema  H35.81   6. Low vision, both eyes  H54.3     1-3. 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D  - pt is delayed to follow up due to being out of town / covid  - pt with long standing history of low vision / legal blindness  - onset of vision loss ~2015 in [place] -- underwent PRP laser OD, PPV OS   - today, reports no acute change in vision OD, but on exam has persistent complete macular detachment with macular hole -- wolf-jaw fibrosis emanating from the disc  - periphery attached with good PRP in place  - exam relatively stable compared to last visit in 2019  - discussed findings, very guarded prognosis, and treatment options  - given longstanding VA of HM OD, unlikely that anatomic reattachment of retina will yield significantly improved vision  - discussed possibility of surgery making vision worse  - pt wishes to monitor for now  - f/u 9-12 months, sooner prn, to monitor for any acute changes in symptoms or exam  4. Stable PDR OS  - s/p PPV and laser PRP OS ~2015 in [place]  - significant macular atrophy OS  - no active disease at this time  - room for PRP fill in peripherally if needed  - monitor  5. Mild non-central retinal edema OS  - monitor  6. Legal blindness OU  - VA HM OU  - long-standing -- onset ~2015  - etiology severe PDR as above  - worked at [employer] for the [employer] Ordered this visit:  No orders of the defined types were placed in this encounter.      No follow-ups on file.  There are no Patient Instructions on file for this visit.  This document serves as  a record of services personally performed by Gardiner Sleeper, MD, PhD. It was created on their behalf by Leeann Must, [place], an ophthalmic technician. The creation of this record is the provider's dictation and/or activities during the visit.    Electronically signed by: Leeann Must, COA _1 @ 10:21 AM   Abbreviations: M myopia (nearsighted); A astigmatism; H hyperopia (farsighted); P presbyopia; Mrx spectacle prescription;  CTL contact lenses; OD right eye; OS left eye; OU both eyes  XT exotropia; ET esotropia; PEK punctate epithelial keratitis; PEE punctate epithelial  erosions; DES dry eye syndrome; MGD meibomian gland dysfunction; ATs artificial tears; PFAT's preservative free artificial tears; [place] nuclear sclerotic cataract; PSC posterior subcapsular cataract; ERM epi-retinal membrane; PVD posterior vitreous detachment; RD retinal detachment; DM diabetes mellitus; DR diabetic retinopathy; NPDR non-proliferative diabetic retinopathy; PDR proliferative diabetic retinopathy; CSME clinically significant macular edema; DME diabetic macular edema; dbh dot blot hemorrhages; CWS cotton wool spot; POAG primary open angle glaucoma; C/D cup-to-disc ratio; HVF humphrey visual field; GVF goldmann visual field; OCT optical coherence tomography; IOP intraocular pressure; BRVO Branch retinal vein occlusion; CRVO central retinal vein occlusion; CRAO central retinal artery occlusion; BRAO branch retinal artery occlusion; RT retinal tear; SB scleral buckle; PPV pars plana vitrectomy; VH Vitreous hemorrhage; PRP panretinal laser photocoagulation; IVK intravitreal kenalog; VMT vitreomacular traction; MH Macular hole;  NVD neovascularization of the disc; NVE neovascularization elsewhere; AREDS age related eye disease study; ARMD age related macular degeneration; POAG primary open angle glaucoma; EBMD epithelial/anterior basement membrane dystrophy; ACIOL anterior chamber intraocular lens; IOL intraocular lens; PCIOL  posterior chamber intraocular lens; Phaco/IOL phacoemulsification with intraocular lens placement; [place] photorefractive keratectomy; LASIK laser assisted in situ keratomileusis; HTN hypertension; DM diabetes mellitus; COPD chronic obstructive pulmonary disease

## 2020-05-20 ENCOUNTER — Telehealth: Payer: Self-pay | Admitting: Internal Medicine

## 2020-05-20 DIAGNOSIS — E1169 Type 2 diabetes mellitus with other specified complication: Secondary | ICD-10-CM

## 2020-05-20 MED ORDER — ACCU-CHEK GUIDE VI STRP: ORAL_STRIP | 2 refills | Status: DC

## 2020-05-20 MED ORDER — ACCU-CHEK GUIDE ME W/DEVICE KIT: PACK | 0 refills | Status: DC

## 2020-05-20 MED ORDER — ACCU-CHEK FASTCLIX LANCETS MISC: 2 refills | Status: DC

## 2020-05-20 NOTE — Telephone Encounter (Signed)
Franky Macho could you assist with this

## 2020-05-20 NOTE — Telephone Encounter (Signed)
Copied from CRM [id number]. Topic: General - Other >> May 20, 2020 10:46 AM Darron Doom wrote: Reason for CRM: [facility] called in to inform Dr Laural Benes that patient need an Rx for a new glucometer they are no longer paying for previous test strips since the meter brand is discontinued and insurance refuses to pay for the strips. Would like an Rx sent in for an Accu-Chek Guide Meter, test strips, softclix lancets and device. Please call Pharmacy with questions

## 2020-05-20 NOTE — Telephone Encounter (Signed)
Rx sent 

## 2020-05-23 ENCOUNTER — Encounter (INDEPENDENT_AMBULATORY_CARE_PROVIDER_SITE_OTHER): Payer: Medicaid Other | Admitting: Ophthalmology

## 2020-05-23 DIAGNOSIS — E113552 Type 2 diabetes mellitus with stable proliferative diabetic retinopathy, left eye: Secondary | ICD-10-CM

## 2020-05-23 DIAGNOSIS — H3341 Traction detachment of retina, right eye: Secondary | ICD-10-CM

## 2020-05-23 DIAGNOSIS — H543 Unqualified visual loss, both eyes: Secondary | ICD-10-CM

## 2020-05-23 DIAGNOSIS — H3581 Retinal edema: Secondary | ICD-10-CM

## 2020-05-23 DIAGNOSIS — H35341 Macular cyst, hole, or pseudohole, right eye: Secondary | ICD-10-CM

## 2020-05-23 DIAGNOSIS — E113541 Type 2 diabetes mellitus with proliferative diabetic retinopathy with combined traction retinal detachment and rhegmatogenous retinal detachment, right eye: Secondary | ICD-10-CM

## 2020-05-23 NOTE — Progress Notes (Addendum)
[facility] Clinic Note  05/24/2020     CHIEF COMPLAINT Patient presents for Retina Follow Up   HISTORY OF PRESENT ILLNESS: Lisa Olson is a 60 y.o. female who presents to the clinic today for:   HPI    Retina Follow Up    Patient presents with  Diabetic Retinopathy.  In right eye.  Severity is severe.  Duration of 11 months.  I, the attending physician,  performed the HPI with the patient and updated documentation appropriately.          Comments    Patient here for 11 months for retina follow up for PDR OU. Patient states vision the same. No eye pain. Uses drops for pressure ou and pain.        Last edited by Rennis Chris, MD on 05/25/2020 12:34 AM. (History)    pt saw Dr. Sherryll Burger in July, she states he suggested she get cataract sx first if she wants to do anything to help her vision, but pt states she talked to Dr. Dione Booze and he told her that the risks were bigger than the benefits for, pt is using timolol once a day in the mornings in both eyes, she is also using Durezol once a day OD in the afternoons for eye pain, pt states she follows with Dr. Dione Booze every 1-2 months bc they want her to be able to come off the Durezol eventually   Referring physician: Olivia Canter, MD [address] [place],  [place] [address]  HISTORICAL INFORMATION:   Selected notes from the MEDICAL RECORD NUMBER Referred by Dr. Fabian Sharp for DM exam LEE: 05.22.19 (S. Groat) [BCVA: OD: HM OS: HM]  Ocular Hx-Cataract OU, Diabetic Retinopathy OU, Retinal Vein Occlusion PMH-DM (last A1C 7.2, taking Metformin), HTN   CURRENT MEDICATIONS: Current Outpatient Medications (Ophthalmic Drugs)  Medication Sig  . timolol (TIMOPTIC) 0.5 % ophthalmic solution 1 drop every morning.  . DUREZOL 0.05 % EMUL Place 1 drop into the right eye 2 (two) times daily.   No current facility-administered medications for this visit. (Ophthalmic Drugs)   Current Outpatient Medications (Other)   Medication Sig  . Accu-Chek FastClix Lancets MISC Use to check blood sugar once daily. E11.69  . Ascorbic Acid (VITAMIN C ER PO) Take by mouth.  . Blood Glucose Monitoring Suppl (ACCU-CHEK GUIDE ME) w/Device KIT Use to check blood sugar once daily. E11.69  . carvedilol (COREG) 3.125 MG tablet Take 1 tablet (3.125 mg total) by mouth 2 (two) times daily with a meal.  . ferrous sulfate 325 (65 FE) MG tablet Take 1 tablet (325 mg total) by mouth daily with breakfast.  . furosemide (LASIX) 20 MG tablet Take 1 tablet (20 mg total) by mouth daily. Stop Hydrochlorothiazide  . glimepiride (AMARYL) 4 MG tablet Take 1 tablet (4 mg total) by mouth 2 (two) times daily.  Marland Kitchen glucose blood (ACCU-CHEK GUIDE) test strip Use to check blood sugar once daily. E11.69  . isosorbide mononitrate (IMDUR) 30 MG 24 hr tablet Take 1 tablet (30 mg total) by mouth daily.  Marland Kitchen lisinopril (ZESTRIL) 30 MG tablet Take 1 tablet (30 mg total) by mouth daily.  . Multiple Vitamins-Minerals (MULTIVITAMIN WITH MINERALS) tablet Take 1 tablet by mouth daily.  . rosuvastatin (CRESTOR) 10 MG tablet Take 1 tablet (10 mg total) by mouth daily.  . metFORMIN (GLUCOPHAGE) 1000 MG tablet Take 1 tablet (1,000 mg total) by mouth 2 (two) times daily with a meal.  TAKE 1 TABLET BY MOUTH TWICE DAILY WITH A MEAL   No current facility-administered medications for this visit. (Other)      REVIEW OF SYSTEMS: ROS    Positive for: Musculoskeletal, Endocrine, Eyes   Negative for: Constitutional, Gastrointestinal, Neurological, Skin, Genitourinary, HENT, Cardiovascular, Respiratory, Psychiatric, Allergic/Imm, Heme/Lymph   Last edited by Roselee Nova D, COT on 05/24/2020  1:42 PM. (History)       ALLERGIES No Known Allergies  PAST MEDICAL HISTORY Past Medical History:  Diagnosis Date  . Diabetes mellitus without complication ([place])   . Hypertension   . Legally blind   . 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 EYE SURGERY    . LASIK    . RETINAL DETACHMENT SURGERY      FAMILY HISTORY Family History  Problem Relation Age of Onset  . Diabetes Mother   . Diabetes Father   . Hypertension Sister     SOCIAL HISTORY Social History   Tobacco Use  . Smoking status: Never Smoker  . Smokeless tobacco: Never Used  Vaping Use  . Vaping Use: Never used  Substance Use Topics  . Drug use: No         OPHTHALMIC EXAM:  Base Eye Exam    Visual Acuity (Snellen - Linear)      Right Left   Dist [place] HM CF at face       Tonometry (Tonopen, 1:51 PM)      Right Left   Pressure 13 22       Pupils      Dark Light Shape React APD   Right 2 1 Round Minimal None   Left 2 1 Round Minimal None       Visual Fields      Left Right   Restrictions Total superior temporal, inferior temporal, superior nasal, inferior nasal deficiencies Total superior temporal, inferior temporal, superior nasal, inferior nasal deficiencies       Extraocular Movement      Right Left    Full, Ortho Full, Ortho       Neuro/Psych    Oriented x3: Yes   Mood/Affect: Normal       Dilation    Both eyes: 1.0% Mydriacyl, 2.5% Phenylephrine @ 1:51 PM        Slit Lamp and Fundus Exam    Slit Lamp Exam      Right Left   Lids/Lashes Dermatochalasis - upper lid, mild Meibomian gland dysfunction Mild Meibomian gland dysfunction   Conjunctiva/Sclera Mild Melanosis, mild inferior Conjunctivochalasis Nasal and Temporal Pinguecula, Melanosis   Cornea Mild Arcus Mild Arcus   Anterior Chamber Shallow with IK touch temporally deep, clear, narrow temporal angle   Iris almost 360 Posterior synechiae with pupil size approx 13m, anterior bowing Round and dilated to 6.074m No NVI   Lens 3+ Nuclear sclerosis with brunescence, 2+ Cortical cataract, 3+ Posterior subcapsular cataract, Vacuoles 2-3+ Nuclear sclerosis, 2+ Cortical cataract, 2+ Posterior subcapsular cataract, Vacuoles   Vitreous Mild  Vitreous syneresis Mild Vitreous syneresis       Fundus Exam      Right Left   Disc No view mild Pallor, fibrosis, temporal PPA   C/D Ratio  0.2   Macula No view Flat, atrophic, fibrosis and pigmented scarring temporal half of macula   Vessels No view Severe Vascular attenuation   Periphery No view Attached, 360 PRP, fibrosis nasal to disc, room for PRP  fill-in superiorly          IMAGING AND PROCEDURES  Imaging and Procedures for @TODAY @  OCT, Retina - OU - Both Eyes       Right Eye Quality was poor (uninterruptable ). Findings include preretinal fibrosis (No view).   Left Eye Quality was good. Central Foveal Thickness: 193. Progression has been stable. Findings include abnormal foveal contour, epiretinal membrane, lamellar hole, outer retinal atrophy, retinal drusen , subretinal hyper-reflective material, intraretinal fluid (Sub-retinal fibrosis/SRHM temporal macula caught on widefield).   Notes *Images captured and stored on drive  Diagnosis / Impression:  OD: no view OS: diffuse retinal atrophy, ERM with lamellar hole  Clinical management:  See below  Abbreviations: NFP - Normal foveal profile. CME - cystoid macular edema. PED - pigment epithelial detachment. IRF - intraretinal fluid. SRF - subretinal fluid. EZ - ellipsoid zone. ERM - epiretinal membrane. ORA - outer retinal atrophy. ORT - outer retinal tubulation. SRHM - subretinal hyper-reflective material                  ASSESSMENT/PLAN:    ICD-10-CM   1. 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 Macular hole of right eye  H35.341   3. Retinal detachment, tractional, right eye  H33.41   4. Posterior synechiae of iris, right  H21.541   5. Stable proliferative diabetic retinopathy of left eye associated with type 2 diabetes mellitus ([place])  W62.0355   6. Retinal edema   H35.81 OCT, Retina - OU - Both Eyes  7. Low vision, both eyes  H54.3     1-4. 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D  - pt to lost to f/u since 7.3.2019  - pt with long standing history of low vision / legal blindness  - onset of vision loss ~2015 in [place] -- underwent PRP laser OD, PPV OS   - today, reports no acute change in vision OD, but prior exam showed persistent complete macular detachment with macular hole -- wolf-jaw fibrosis emanating from the disc; periphery attached with good PRP in place  - discussed findings, very guarded prognosis, and treatment options  - given longstanding VA of HM OD, unlikely that anatomic reattachment of retina will yield significantly improved vision  - discussed possibility of surgery making vision worse  - pt wishes to monitor for now  - anterior exam shows almost complete posterior synechiae of the iris with significant anterior bowing of iris -- may benefit from laser PI -- will refer back to Dr. Katy Fitch for evaluation  - start atropine BID OD  - increase Durezol to BID OD  - continue timolol BID OU  - f/u here in 1 year, sooner prn, to monitor for any acute changes in symptoms or exam  5. Stable PDR OS  - s/p PPV and laser PRP OS ~2015 in [place]  - significant macular atrophy OS  - no active disease at this time  - room for PRP fill in peripherally if needed  - monitor  6. Mild non-central retinal edema OS  - monitor  7. Legal blindness OU  - VA HM OU  - long-standing -- onset ~2015  - etiology severe PDR as above  - worked at [employer] for the [employer] Ordered this visit:  No orders of the defined types were placed in this encounter.  Return in about 1 year (around 05/24/2021) for f/u PDR OU, DFE, OCT.  There are no Patient Instructions on file for this visit.   Explained the diagnoses, plan, and follow up with the patient and they expressed understanding.  Patient expressed  understanding of the importance of proper follow up care.   This document serves as a record of services personally performed by Gardiner Sleeper, MD, PhD. It was created on their behalf by Estill Bakes, COT an ophthalmic technician. The creation of this record is the provider's dictation and/or activities during the visit.    Electronically signed by: Estill Bakes, COT 3.7.22 @ 12:45 AM   This document serves as a record of services personally performed by Gardiner Sleeper, MD, PhD. It was created on their behalf by San Jetty. Owens Shark, OA an ophthalmic technician. The creation of this record is the provider's dictation and/or activities during the visit.    Electronically signed by: San Jetty. Marguerita Merles 03.08.2022 12:45 AM  Gardiner Sleeper, M.D., Ph.D. Diseases & Surgery of the Retina and [place] 05/24/2020   I have reviewed the above documentation for accuracy and completeness, and I agree with the above. Gardiner Sleeper, M.D., Ph.D. 05/25/20 12:45 AM   Abbreviations: M myopia (nearsighted); A astigmatism; H hyperopia (farsighted); P presbyopia; Mrx spectacle prescription;  CTL contact lenses; OD right eye; OS left eye; OU both eyes  XT exotropia; ET esotropia; PEK punctate epithelial keratitis; PEE punctate epithelial erosions; DES dry eye syndrome; MGD meibomian gland dysfunction; ATs artificial tears; PFAT's preservative free artificial tears; [place] nuclear sclerotic cataract; PSC posterior subcapsular cataract; ERM epi-retinal membrane; PVD posterior vitreous detachment; RD retinal detachment; DM diabetes mellitus; DR diabetic retinopathy; NPDR non-proliferative diabetic retinopathy; PDR proliferative diabetic retinopathy; CSME clinically significant macular edema; DME diabetic macular edema; dbh dot blot hemorrhages; CWS cotton wool spot; POAG primary open angle glaucoma; C/D cup-to-disc ratio; HVF humphrey visual field; GVF goldmann visual field; OCT optical  coherence tomography; IOP intraocular pressure; BRVO Branch retinal vein occlusion; CRVO central retinal vein occlusion; CRAO central retinal artery occlusion; BRAO branch retinal artery occlusion; RT retinal tear; SB scleral buckle; PPV pars plana vitrectomy; VH Vitreous hemorrhage; PRP panretinal laser photocoagulation; IVK intravitreal kenalog; VMT vitreomacular traction; MH Macular hole;  NVD neovascularization of the disc; NVE neovascularization elsewhere; AREDS age related eye disease study; ARMD age related macular degeneration; POAG primary open angle glaucoma; EBMD epithelial/anterior basement membrane dystrophy; ACIOL anterior chamber intraocular lens; IOL intraocular lens; PCIOL posterior chamber intraocular lens; Phaco/IOL phacoemulsification with intraocular lens placement; [place] photorefractive keratectomy; LASIK laser assisted in situ keratomileusis; HTN hypertension; DM diabetes mellitus; COPD chronic obstructive pulmonary disease

## 2020-05-24 ENCOUNTER — Telehealth: Payer: Self-pay | Admitting: Internal Medicine

## 2020-05-24 ENCOUNTER — Ambulatory Visit (INDEPENDENT_AMBULATORY_CARE_PROVIDER_SITE_OTHER): Payer: Medicaid Other | Admitting: Ophthalmology

## 2020-05-24 ENCOUNTER — Other Ambulatory Visit: Payer: Self-pay

## 2020-05-24 ENCOUNTER — Encounter (INDEPENDENT_AMBULATORY_CARE_PROVIDER_SITE_OTHER): Payer: Self-pay | Admitting: Ophthalmology

## 2020-05-24 DIAGNOSIS — H21541 Posterior synechiae (iris), right eye: Secondary | ICD-10-CM | POA: Diagnosis not present

## 2020-05-24 DIAGNOSIS — H35341 Macular cyst, hole, or pseudohole, right eye: Secondary | ICD-10-CM | POA: Diagnosis not present

## 2020-05-24 DIAGNOSIS — H3581 Retinal edema: Secondary | ICD-10-CM

## 2020-05-24 DIAGNOSIS — E113541 Type 2 diabetes mellitus with proliferative diabetic retinopathy with combined traction retinal detachment and rhegmatogenous retinal detachment, right eye: Secondary | ICD-10-CM

## 2020-05-24 DIAGNOSIS — H3341 Traction detachment of retina, right eye: Secondary | ICD-10-CM | POA: Diagnosis not present

## 2020-05-24 DIAGNOSIS — E113552 Type 2 diabetes mellitus with stable proliferative diabetic retinopathy, left eye: Secondary | ICD-10-CM | POA: Diagnosis not present

## 2020-05-24 DIAGNOSIS — H543 Unqualified visual loss, both eyes: Secondary | ICD-10-CM | POA: Diagnosis not present

## 2020-05-24 NOTE — Telephone Encounter (Signed)
Received paperwork and placed in provider folder

## 2020-05-24 NOTE — Telephone Encounter (Signed)
Received  [id number] Form to be filled out by Dr. Laural Benes and sent back. Placed form in PCP box

## 2020-05-25 ENCOUNTER — Encounter (INDEPENDENT_AMBULATORY_CARE_PROVIDER_SITE_OTHER): Payer: Self-pay | Admitting: Ophthalmology

## 2020-05-25 NOTE — Telephone Encounter (Signed)
Patient called and we advised she needs a virtual visit to discuss PCS services. Offered her an appointment Monday 3/14 with [place] and patient stated it would not work. I asked patient to stay on brief hold and the call was disconnected.

## 2020-05-26 ENCOUNTER — Ambulatory Visit: Payer: Medicaid Other

## 2020-05-30 ENCOUNTER — Encounter: Payer: Self-pay | Admitting: Family Medicine

## 2020-05-30 ENCOUNTER — Other Ambulatory Visit: Payer: Self-pay

## 2020-05-30 ENCOUNTER — Ambulatory Visit: Payer: Medicaid Other | Attending: Family Medicine | Admitting: Family Medicine

## 2020-05-30 DIAGNOSIS — E11319 Type 2 diabetes mellitus with unspecified diabetic retinopathy without macular edema: Secondary | ICD-10-CM

## 2020-05-30 NOTE — Progress Notes (Signed)
Virtual Visit via Telephone Note  I connected with Lisa Olson, on 05/30/2020 at 4:15 PM by telephone due to the COVID-19 pandemic and verified that I am speaking with the correct person using two identifiers.   Consent: I discussed the limitations, risks, security and privacy concerns of performing an evaluation and management service by telephone and the availability of in person appointments. I also discussed with the patient that there may be a patient responsible charge related to this service. The patient expressed understanding and agreed to proceed.   Location of Patient: Home  Location of Provider: Clinic   Persons participating in Telemedicine visit: Theodis Sato Longfellow-student Dr. Margarita Rana     History of Present Illness: 60 year old female patient of Dr. Wynetta Emery with a history of type 2 diabetes mellitus, diabetic retinopathy, hyperlipidemia, coronary artery disease seen for this visit to complete PCS form.   She has PCS services - 10 hours/ week through [facility] and would like to keep her hours. Needs assistance with bathing , dressing, food preparation, light housekeeping. She is continent of her bladder and bowel. Uses Cane for ambulation. She had a visit for follow-up with her PCP for chronic disease management 6 weeks ago.  She uses a magnifying glass to check her blood sugar readings and her glucometer and checks her blood pressure at home with a blood pressure monitor. Last BP was 118/63 She has no chest pain or dyspnea.  Past Medical History:  Diagnosis Date  . Diabetes mellitus without complication ([place])   . Hypertension   . Legally blind   . 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 Accu-Chek FastClix Lancets MISC Use to check blood sugar once daily. E11.69 100 each 2  . Ascorbic Acid (VITAMIN C ER PO) Take by mouth.    .  Blood Glucose Monitoring Suppl (ACCU-CHEK GUIDE ME) w/Device KIT Use to check blood sugar once daily. E11.69 1 kit 0  . carvedilol (COREG) 3.125 MG tablet Take 1 tablet (3.125 mg total) by mouth 2 (two) times daily with a meal. 180 tablet 3  . DUREZOL 0.05 % EMUL Place 1 drop into the right eye 2 (two) times daily.    . ferrous sulfate 325 (65 FE) MG tablet Take 1 tablet (325 mg total) by mouth daily with breakfast. 100 tablet 3  . furosemide (LASIX) 20 MG tablet Take 1 tablet (20 mg total) by mouth daily. Stop Hydrochlorothiazide 30 tablet 6  . glimepiride (AMARYL) 4 MG tablet Take 1 tablet (4 mg total) by mouth 2 (two) times daily. 60 tablet 1  . glucose blood (ACCU-CHEK GUIDE) test strip Use to check blood sugar once daily. E11.69 100 each 2  . isosorbide mononitrate (IMDUR) 30 MG 24 hr tablet Take 1 tablet (30 mg total) by mouth daily. 90 tablet 3  . lisinopril (ZESTRIL) 30 MG tablet Take 1 tablet (30 mg total) by mouth daily. 30 tablet 1  . metFORMIN (GLUCOPHAGE) 1000 MG tablet Take 1 tablet (1,000 mg total) by mouth 2 (two) times daily with a meal. TAKE 1 TABLET BY MOUTH TWICE DAILY WITH A MEAL 60 tablet 1  . Multiple Vitamins-Minerals (MULTIVITAMIN WITH MINERALS) tablet Take 1 tablet by mouth daily.    . rosuvastatin (CRESTOR) 10 MG tablet Take 1 tablet (10 mg total) by mouth daily. 90 tablet 3  . timolol (TIMOPTIC) 0.5 % ophthalmic solution  1 drop every morning.     No current facility-administered medications on file prior to visit.    ROS: See HPI  Observations/Objective: Awake, alert, and 2x3 Not in acute distress  Assessment and Plan: 1. Diabetic retinopathy of both eyes associated with type 2 diabetes mellitus, macular edema presence unspecified, unspecified retinopathy severity ([place]) Completed form for PCS services Continue diabetic regimen Followed by ophthalmology for management of diabetic retinopathy Counseled on Diabetic diet, my plate method, 275 minutes of moderate  intensity exercise/week Blood sugar logs with fasting goals of 80-120 mg/dl, random of less than 180 and in the event of sugars less than 60 mg/dl or greater than 400 mg/dl encouraged to notify the clinic. Advised on the need for annual eye exams, annual foot exams, Pneumonia vaccine.   Follow Up Instructions: Follow-up with PCP for chronic disease management   I discussed the assessment and treatment plan with the patient. The patient was provided an opportunity to ask questions and all were answered. The patient agreed with the plan and demonstrated an understanding of the instructions.   The patient was advised to call back or seek an in-person evaluation if the symptoms worsen or if the condition fails to improve as anticipated.     I provided 11 minutes total of non-face-to-face time during this encounter.   Charlott Rakes, MD, FAAFP. [facility] [place], [place]   05/30/2020, 4:15 PM

## 2020-05-31 ENCOUNTER — Telehealth: Payer: Self-pay

## 2020-05-31 NOTE — Telephone Encounter (Signed)
Contacted pt to to see if she is requesting additional hours for pcs pt didn't answer left a detailed vm asking pt to give me a call

## 2020-05-31 NOTE — Telephone Encounter (Signed)
Pt called to confirm that she does NOT want any additional hours. Pt felt very insulted by Dr. Alvis Lemmings, she states that she lacks bedside manner. She is insulted that Dr. Alvis Lemmings would not disclose her country of origin with her.   Best contact: [phone]

## 2020-06-06 DIAGNOSIS — H2141 Pupillary membranes, right eye: Secondary | ICD-10-CM | POA: Diagnosis not present

## 2020-06-06 DIAGNOSIS — H20011 Primary iridocyclitis, right eye: Secondary | ICD-10-CM | POA: Diagnosis not present

## 2020-06-08 ENCOUNTER — Ambulatory Visit: Payer: Medicaid Other | Admitting: Podiatry

## 2020-06-08 ENCOUNTER — Other Ambulatory Visit: Payer: Self-pay

## 2020-06-08 ENCOUNTER — Encounter: Payer: Self-pay | Admitting: Podiatry

## 2020-06-08 DIAGNOSIS — E119 Type 2 diabetes mellitus without complications: Secondary | ICD-10-CM

## 2020-06-08 DIAGNOSIS — M2010 Hallux valgus (acquired), unspecified foot: Secondary | ICD-10-CM | POA: Diagnosis not present

## 2020-06-08 DIAGNOSIS — B351 Tinea unguium: Secondary | ICD-10-CM

## 2020-06-08 DIAGNOSIS — M79675 Pain in left toe(s): Secondary | ICD-10-CM | POA: Diagnosis not present

## 2020-06-08 DIAGNOSIS — M79674 Pain in right toe(s): Secondary | ICD-10-CM

## 2020-06-08 NOTE — Progress Notes (Signed)
This patient returns to my office for at risk foot care.  This patient requires this care by a professional since this patient will be at risk due to having type 2 diabetes.    This patient is unable to cut nails herself since the patient cannot reach her nails.These nails are painful walking and wearing shoes.  This patient presents for at risk foot care today.  General Appearance  Alert, conversant and in no acute stress.  Vascular  Dorsalis pedis and posterior tibial  pulses are  Weakly palpable  bilaterally.  Capillary return is within normal limits  bilaterally. Temperature is within normal limits  bilaterally.  Neurologic  Senn-Weinstein monofilament wire test within normal limits  bilaterally. Muscle power within normal limits bilaterally.  Nails Thick disfigured discolored nails with subungual debris  from hallux to fifth toes bilaterally. No evidence of bacterial infection or drainage bilaterally.  Orthopedic  No limitations of motion  feet .  No crepitus or effusions noted.  No bony pathology or digital deformities noted. Mild  HAV  B/L.  Skin  normotropic skin with no porokeratosis noted bilaterally.  No signs of infections or ulcers noted.     Onychomycosis  Pain in right toes  Pain in left toes  Consent was obtained for treatment procedures.   Mechanical debridement of nails 1-5  bilaterally performed with a nail nipper.  Filed with dremel without incident.    Return office visit  3 months                    Told patient to return for periodic foot care and evaluation due to potential at risk complications.   Cortlyn Cannell DPM  

## 2020-06-15 ENCOUNTER — Other Ambulatory Visit: Payer: Self-pay | Admitting: Internal Medicine

## 2020-06-15 ENCOUNTER — Telehealth: Payer: Self-pay | Admitting: Internal Medicine

## 2020-06-15 DIAGNOSIS — E118 Type 2 diabetes mellitus with unspecified complications: Secondary | ICD-10-CM

## 2020-06-15 DIAGNOSIS — E11319 Type 2 diabetes mellitus with unspecified diabetic retinopathy without macular edema: Secondary | ICD-10-CM

## 2020-06-15 DIAGNOSIS — I10 Essential (primary) hypertension: Secondary | ICD-10-CM

## 2020-06-15 NOTE — Telephone Encounter (Signed)
Will forward to provider  

## 2020-06-15 NOTE — Telephone Encounter (Signed)
Copied from CRM [id number]. Topic: General - Inquiry >> Jun 14, 2020  1:37 PM Daphine Deutscher D wrote: Reason for CRM: Pt called asking if Dr. Laural Benes could get her a referral to an eye doctor.  She thinks she has inflamation in her eye.  CB#  [phone]

## 2020-06-18 ENCOUNTER — Telehealth: Payer: Self-pay | Admitting: Internal Medicine

## 2020-06-18 NOTE — Telephone Encounter (Signed)
-----   Message from Dionne Bucy sent at 06/17/2020  1:35 PM EDT ----- Regarding: RE: Ophthalmology referral [facility] Consult date 06/23/2020 01:00 PM Seen By Thana Farr  ----- Message ----- From: Marcine Matar, MD Sent: 06/16/2020   8:13 AM EDT To: Dionne Bucy Subject: Ophthalmology referral                         Please try to get her in.

## 2020-06-27 DIAGNOSIS — H401134 Primary open-angle glaucoma, bilateral, indeterminate stage: Secondary | ICD-10-CM | POA: Diagnosis not present

## 2020-06-27 DIAGNOSIS — E113552 Type 2 diabetes mellitus with stable proliferative diabetic retinopathy, left eye: Secondary | ICD-10-CM | POA: Diagnosis not present

## 2020-06-27 DIAGNOSIS — E113521 Type 2 diabetes mellitus with proliferative diabetic retinopathy with traction retinal detachment involving the macula, right eye: Secondary | ICD-10-CM | POA: Diagnosis not present

## 2020-06-27 DIAGNOSIS — H2513 Age-related nuclear cataract, bilateral: Secondary | ICD-10-CM | POA: Diagnosis not present

## 2020-06-30 DIAGNOSIS — Z8669 Personal history of other diseases of the nervous system and sense organs: Secondary | ICD-10-CM | POA: Insufficient documentation

## 2020-06-30 DIAGNOSIS — H401134 Primary open-angle glaucoma, bilateral, indeterminate stage: Secondary | ICD-10-CM | POA: Insufficient documentation

## 2020-07-01 IMAGING — MG DIGITAL SCREENING BILAT W/ TOMO W/ CAD
4 series · 4 of 16 positions shown · non-contrast
Comparison: Previous exam(s).

CLINICAL DATA: Screening.

EXAM:
DIGITAL SCREENING BILATERAL MAMMOGRAM WITH TOMO AND CAD

[L CC synth-2D]
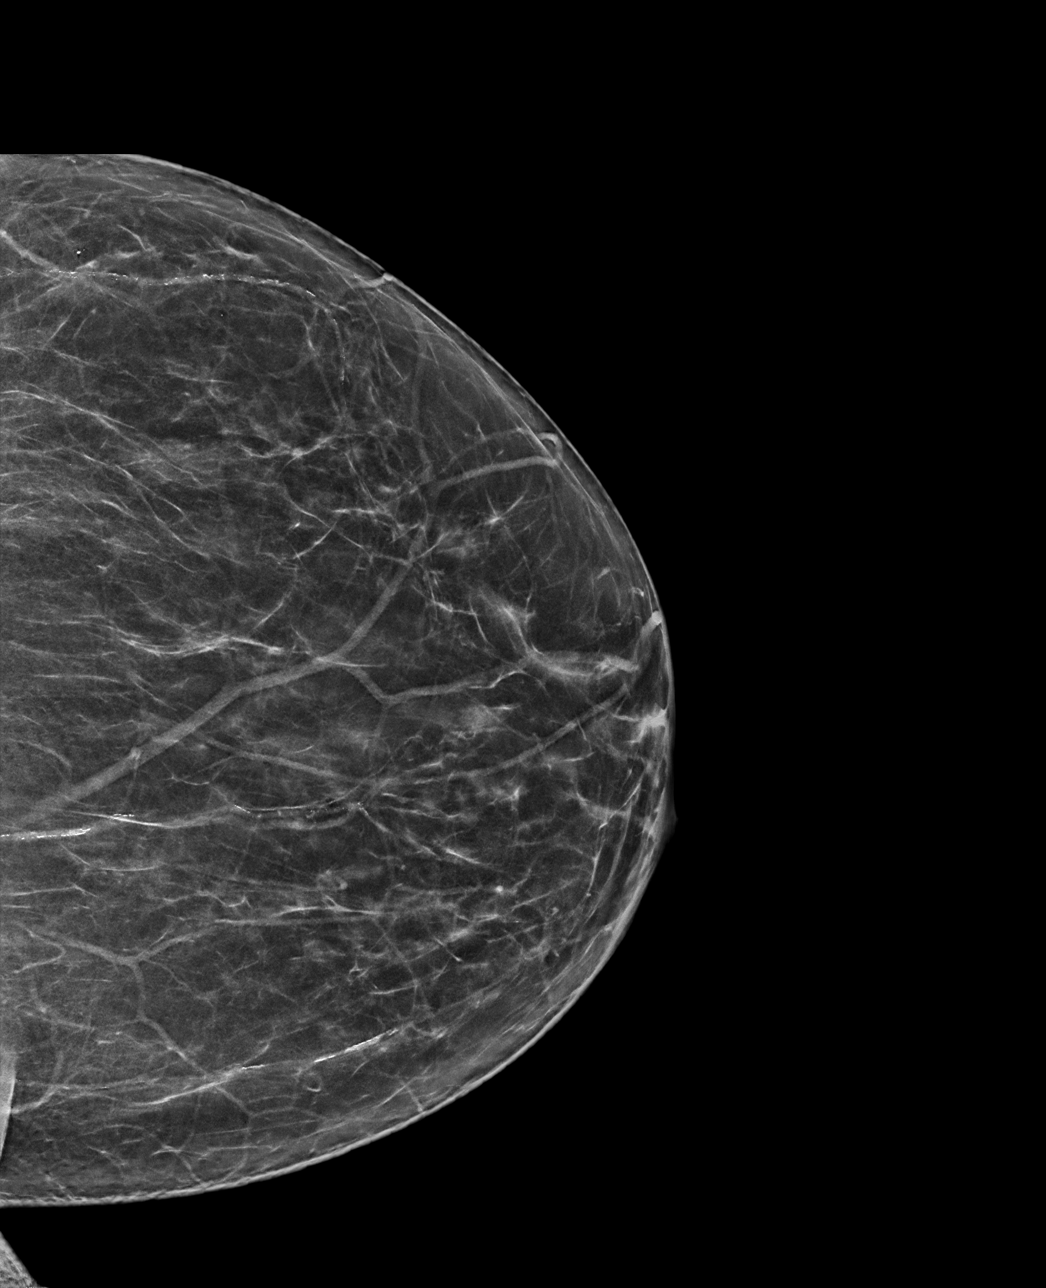

[R CC tomo · tomo slice 33/64.0]
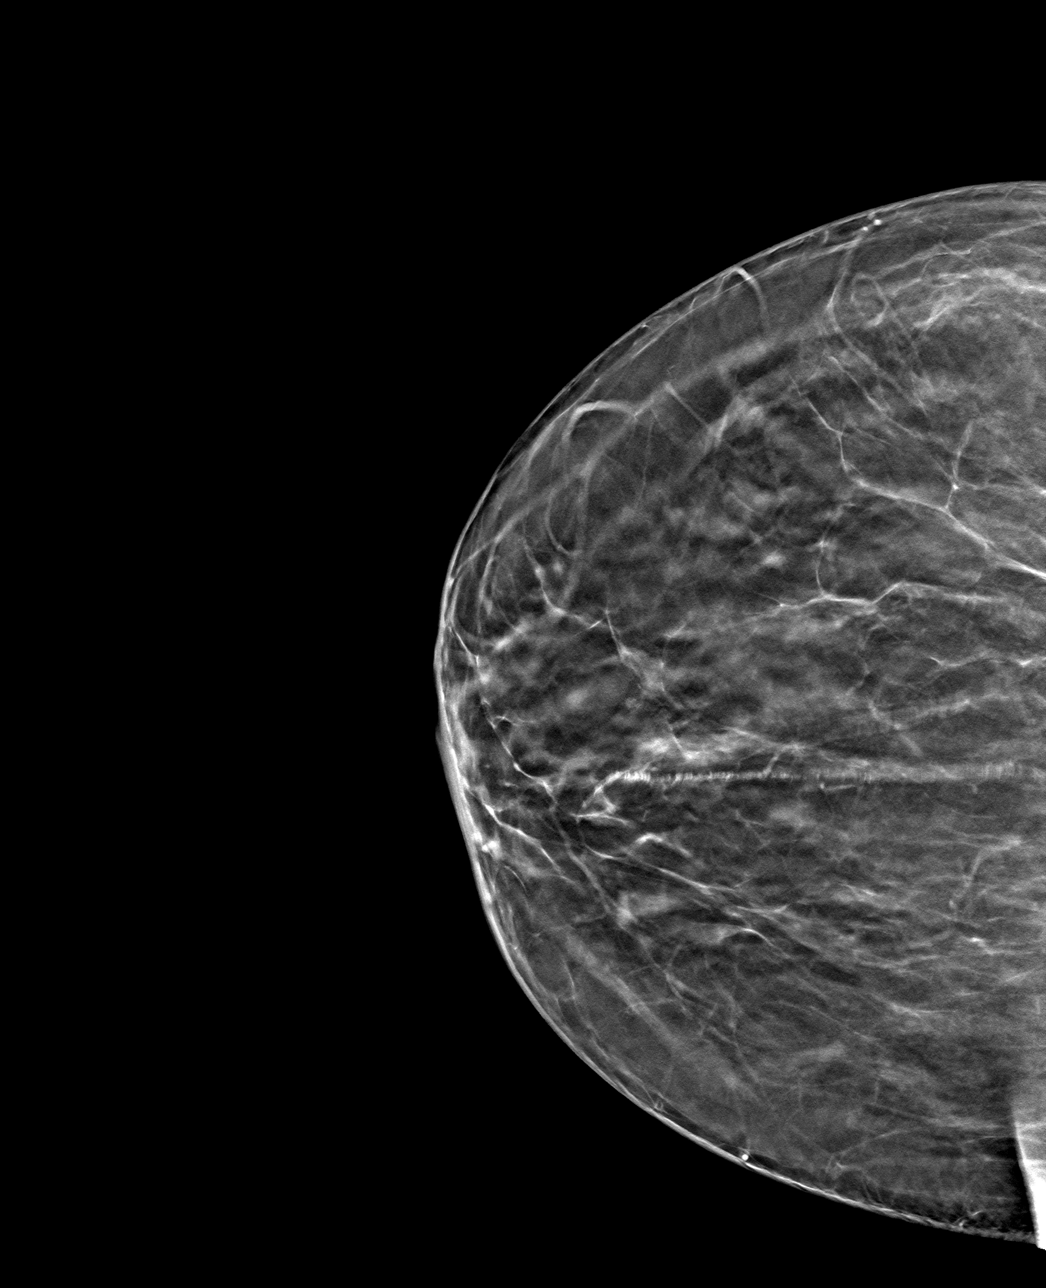

[L CC tomo · tomo slice 34/67.0]
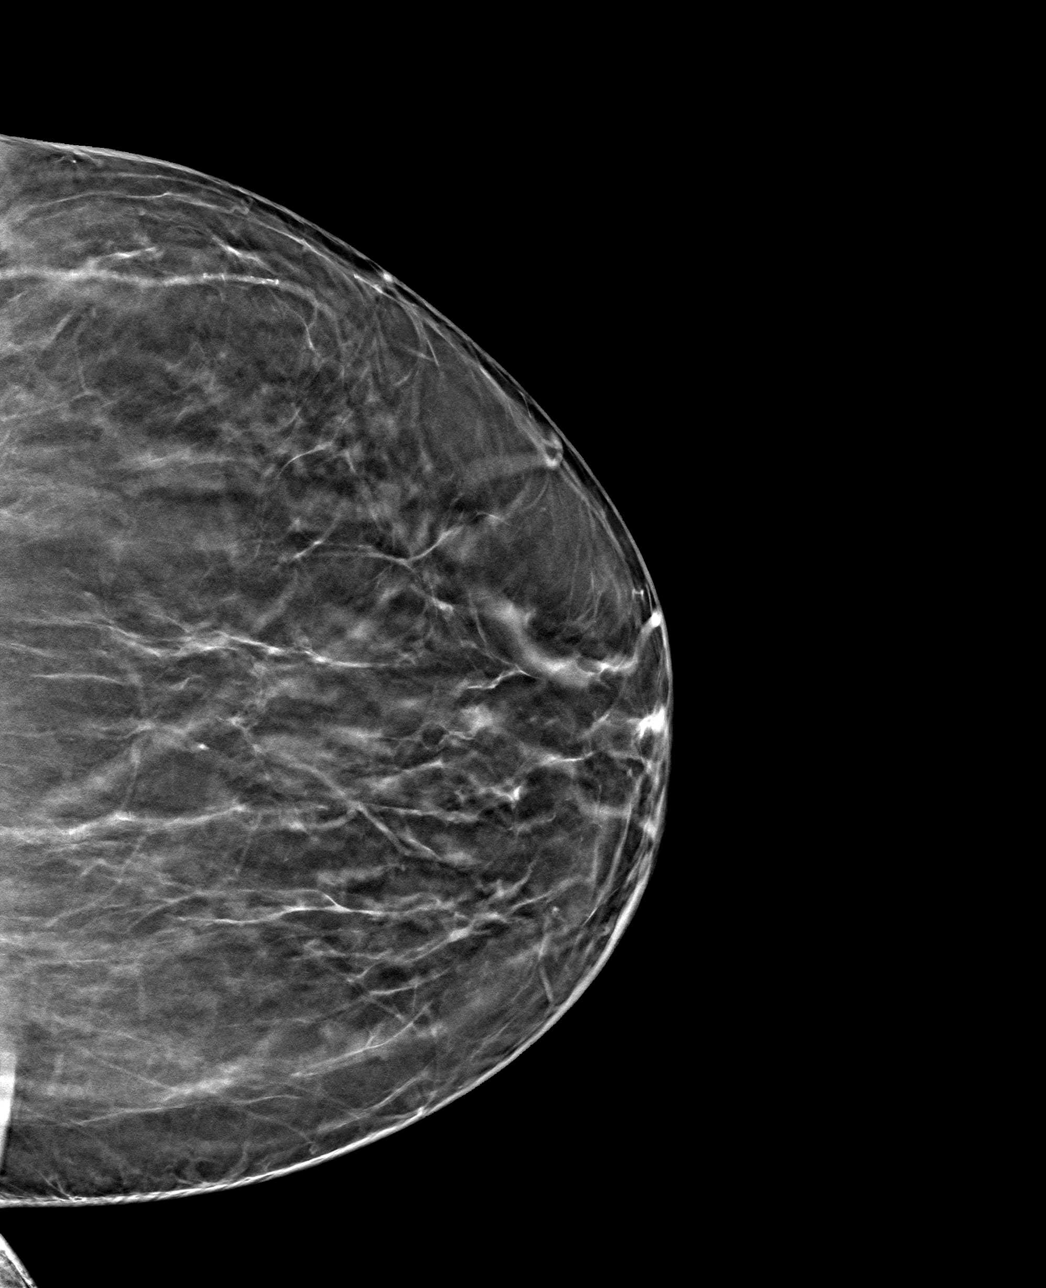

[R MLO tomo · tomo slice 47/94.0]
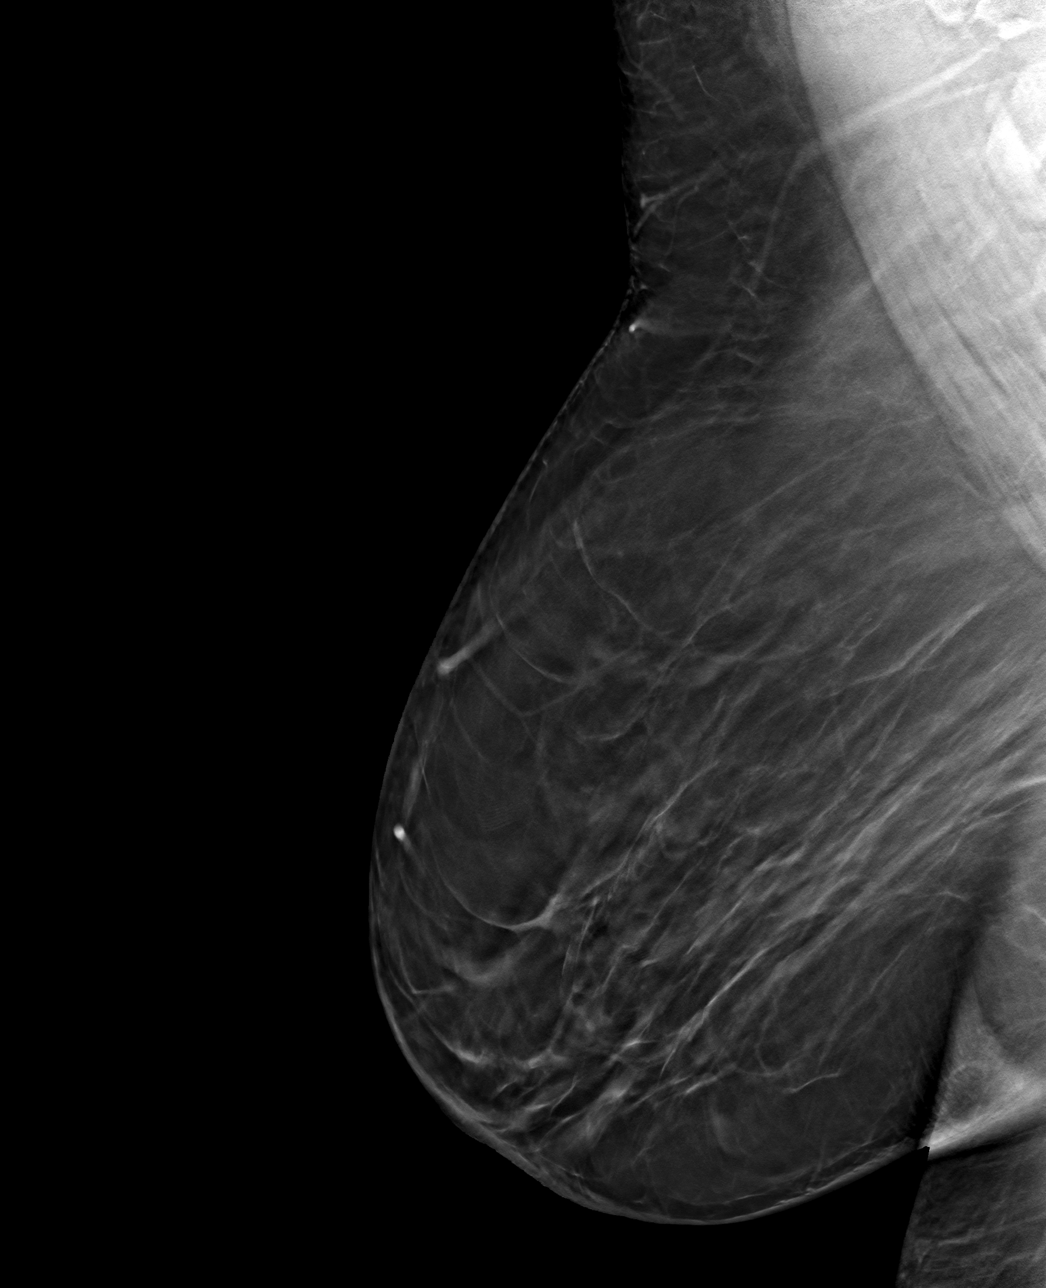

[4 of 16 positions shown; findings below may reference images not displayed]

ACR Breast Density Category b: There are scattered areas of
fibroglandular density.
FINDINGS: There are no findings suspicious for malignancy. Images were
processed with CAD.
IMPRESSION: No mammographic evidence of malignancy. A result letter of this
screening mammogram will be mailed directly to the patient.

RECOMMENDATION:
Screening mammogram in one year. (Code:[id number])

BI-RADS CATEGORY  1: Negative.

## 2020-07-07 ENCOUNTER — Other Ambulatory Visit: Payer: Self-pay | Admitting: Internal Medicine

## 2020-07-07 ENCOUNTER — Telehealth: Payer: Self-pay

## 2020-07-07 DIAGNOSIS — E118 Type 2 diabetes mellitus with unspecified complications: Secondary | ICD-10-CM

## 2020-07-07 NOTE — Telephone Encounter (Signed)
This CM spoke to Tawny Hopping, [employer]/Admin who stated that the patient has been re-authorized for PCS from 05/17/2020-12/16/2020.

## 2020-07-13 ENCOUNTER — Telehealth: Payer: Self-pay

## 2020-07-13 NOTE — Patient Outreach (Signed)
Care Coordination  07/13/2020  Lisa Olson 1960-03-23 [id number]   Medicaid Managed Care   Unsuccessful Outreach Note  07/13/2020 Name: Lisa Olson MRN: [id number] DOB: 08/21/1960  Referred by: Marcine Matar, MD Reason for referral : High Risk Managed Medicaid (MM Screen unsuccessful Telephone Outreach)   Third unsuccessful telephone outreach was attempted today. The patient was referred to the case management team for assistance with care management and care coordination. The patient's primary care provider has been notified of our unsuccessful attempts to make or maintain contact with the patient. The care management team is pleased to engage with this patient at any time in the future should he/she be interested in assistance from the care management team.   Follow Up Plan: The patient has been provided with contact information for the care management team and has been advised to call with any health related questions or concerns.   Gus Puma, BSW, [place] [facility]  [place]  High Risk Managed Medicaid Team

## 2020-07-13 NOTE — Patient Instructions (Signed)
Visit Information  Ms. Dorita Sciara  - as a part of your Medicaid benefit, you are eligible for care management and care coordination services at no cost or copay. I was unable to reach you by phone today but would be happy to help you with your health related needs. Please feel free to call me @ [phone].     Gus Puma, BSW, [place] [facility]  [place]  High Risk Managed Medicaid Team

## 2020-07-15 ENCOUNTER — Ambulatory Visit: Payer: Medicaid Other

## 2020-08-17 ENCOUNTER — Telehealth: Payer: Self-pay | Admitting: Internal Medicine

## 2020-08-17 NOTE — Telephone Encounter (Signed)
..   Medicaid Managed Care   Unsuccessful Outreach Note  08/17/2020 Name: Lisa Olson MRN: [id number] DOB: 1960-04-08  Referred by: Marcine Matar, MD Reason for referral : High Risk Managed Medicaid (Attempted to reach Ms.Maclellan today to get her scheduled with the [facility] team. I keft my name and number for her to return my call.)   An unsuccessful telephone outreach was attempted today. The patient was referred to the case management team for assistance with care management and care coordination.   Follow Up Plan: The care management team will reach out to the patient again over the next 7-14 days.  Weston Settle Care Guide, High Risk Medicaid Managed Care Embedded Care Coordination [facility] [place]  [facility]

## 2020-08-29 ENCOUNTER — Other Ambulatory Visit: Payer: Self-pay | Admitting: *Deleted

## 2020-08-29 NOTE — Patient Outreach (Signed)
Care Coordination  08/29/2020  [name] 21-Nov-1960 [id number]   Medicaid Managed Care   Unsuccessful Outreach Note  08/29/2020 Name: Lisa Olson MRN: [id number] DOB: [birth year]  Referred by: Marcine Matar, MD Reason for referral : High Risk Managed Medicaid (Unsuccessful RNCM initial outreach)   An unsuccessful telephone outreach was attempted today. The patient was referred to the case management team for assistance with care management and care coordination.   Follow Up Plan: A HIPAA compliant phone message was left for the patient providing contact information and requesting a return call.  The care management team will reach out to the patient again over the next 14 days.   Estanislado Emms RN, BSN [place]  [facility]

## 2020-08-29 NOTE — Patient Instructions (Signed)
Visit Information  [name]  - as a part of your Medicaid benefit, you are eligible for care management and care coordination services at no cost or copay. I was unable to reach you by phone today but would be happy to help you with your health related needs. Please feel free to call me @ [phone] or to reschedule, call Victorino Dike @ [phone].   A member of the Managed Medicaid care management team will reach out to you again over the next 7-14 days.   Estanislado Emms RN, BSN [place]  [facility]

## 2020-09-13 ENCOUNTER — Ambulatory Visit: Payer: Medicaid Other | Admitting: Podiatry

## 2020-09-20 ENCOUNTER — Ambulatory Visit: Payer: Medicaid Other | Admitting: Podiatry

## 2020-09-26 ENCOUNTER — Other Ambulatory Visit: Payer: Self-pay | Admitting: Internal Medicine

## 2020-09-26 DIAGNOSIS — E669 Obesity, unspecified: Secondary | ICD-10-CM

## 2020-09-26 DIAGNOSIS — E1169 Type 2 diabetes mellitus with other specified complication: Secondary | ICD-10-CM

## 2020-10-04 ENCOUNTER — Other Ambulatory Visit: Payer: Self-pay | Admitting: Internal Medicine

## 2020-10-04 DIAGNOSIS — I10 Essential (primary) hypertension: Secondary | ICD-10-CM

## 2020-10-04 DIAGNOSIS — E1169 Type 2 diabetes mellitus with other specified complication: Secondary | ICD-10-CM

## 2020-10-04 NOTE — Telephone Encounter (Signed)
Requested medication (s) are due for refill today: no  Requested medication (s) are on the active medication list: yes   Last refill:  09/30/2020  Future visit scheduled:no  Notes to clinic:  Patient is due for follow up on chronic management  Review for refills   Requested Prescriptions  Pending Prescriptions Disp Refills   lisinopril (ZESTRIL) 30 MG tablet [Pharmacy Med Name: Lisinopril 30 MG Oral Tablet] 30 tablet 0    Sig: Take 1 tablet by mouth once daily      Cardiovascular:  ACE Inhibitors Failed - 10/04/2020 10:04 AM      Failed - Last BP in normal range    BP Readings from Last 1 Encounters:  05/13/20 (!) 142/80          Passed - Cr in normal range and within 180 days    Creat  Date Value Ref Range Status  02/13/2016 0.93 0.50 - 1.05 mg/dL Final    Comment:      For patients > or = 61 years of age: The upper reference limit for Creatinine is approximately 13% higher for people identified as African-American.      Creatinine, Ser  Date Value Ref Range Status  04/14/2020 0.84 0.57 - 1.00 mg/dL Final   Creatinine, Urine  Date Value Ref Range Status  05/14/2016 227 20 - 320 mg/dL Final          Passed - K in normal range and within 180 days    Potassium  Date Value Ref Range Status  04/14/2020 4.3 3.5 - 5.2 mmol/L Final          Passed - Patient is not pregnant      Passed - Valid encounter within last 6 months    Recent Outpatient Visits           4 months ago Diabetic retinopathy of both eyes associated with type 2 diabetes mellitus, macular edema presence unspecified, unspecified retinopathy severity (HCC)   [facility] [place], [place], MD   5 months ago Type 2 diabetes mellitus with obesity (HCC)   [facility] [place], Gavin Pound B, MD   1 year ago Controlled type 2 diabetes mellitus with complication, without long-term current use of insulin (HCC)   [facility] [place], Gavin Pound B, MD   1 year ago Controlled type 2 diabetes mellitus with complication, without long-term current use of insulin (HCC)   [facility] Marcine Matar, MD   2 years ago Essential hypertension   [facility] Marcine Matar, MD                  glucose blood test strip [facility] Name: Accu-Chek Aviva Plus In Vitro Strip] 100 each 0    Sig: USE 1 STRIP TO CHECK GLUCOSE THREE TIMES DAILY AS DIRECTED      Endocrinology: Diabetes - Testing Supplies Passed - 10/04/2020 10:04 AM      Passed - Valid encounter within last 12 months    Recent Outpatient Visits           4 months ago Diabetic retinopathy of both eyes associated with type 2 diabetes mellitus, macular edema presence unspecified, unspecified retinopathy severity (HCC)   [facility] [place], [place], MD   5 months ago Type 2 diabetes mellitus with obesity [facility])   [facility] [place],  Binnie Rail, MD   1 year ago Controlled type 2 diabetes mellitus with complication, without long-term current use of insulin [facility])   [place] [facility] Jonah Blue B, MD   1 year ago Controlled type 2 diabetes mellitus with complication, without long-term current use of insulin [facility])   [place] [facility] Marcine Matar, MD   2 years ago Essential hypertension   [facility] Marcine Matar, MD

## 2020-10-14 ENCOUNTER — Ambulatory Visit (INDEPENDENT_AMBULATORY_CARE_PROVIDER_SITE_OTHER): Payer: Medicaid Other | Admitting: Podiatry

## 2020-10-14 ENCOUNTER — Encounter: Payer: Self-pay | Admitting: Podiatry

## 2020-10-14 ENCOUNTER — Other Ambulatory Visit: Payer: Self-pay

## 2020-10-14 DIAGNOSIS — M2010 Hallux valgus (acquired), unspecified foot: Secondary | ICD-10-CM

## 2020-10-14 DIAGNOSIS — B351 Tinea unguium: Secondary | ICD-10-CM

## 2020-10-14 DIAGNOSIS — E119 Type 2 diabetes mellitus without complications: Secondary | ICD-10-CM

## 2020-10-14 DIAGNOSIS — M79675 Pain in left toe(s): Secondary | ICD-10-CM | POA: Diagnosis not present

## 2020-10-14 DIAGNOSIS — M79674 Pain in right toe(s): Secondary | ICD-10-CM

## 2020-10-14 NOTE — Progress Notes (Signed)
This patient returns to my office for at risk foot care.  This patient requires this care by a professional since this patient will be at risk due to having type 2 diabetes.    This patient is unable to cut nails herself since the patient cannot reach her nails.These nails are painful walking and wearing shoes.  This patient presents for at risk foot care today.  General Appearance  Alert, conversant and in no acute stress.  Vascular  Dorsalis pedis and posterior tibial  pulses are  Weakly palpable  bilaterally.  Capillary return is within normal limits  bilaterally. Temperature is within normal limits  bilaterally.  Neurologic  Senn-Weinstein monofilament wire test within normal limits  bilaterally. Muscle power within normal limits bilaterally.  Nails Thick disfigured discolored nails with subungual debris  from hallux to fifth toes bilaterally. No evidence of bacterial infection or drainage bilaterally.  Orthopedic  No limitations of motion  feet .  No crepitus or effusions noted.  No bony pathology or digital deformities noted. Mild  HAV  B/L.  Skin  normotropic skin with no porokeratosis noted bilaterally.  No signs of infections or ulcers noted.     Onychomycosis  Pain in right toes  Pain in left toes  Consent was obtained for treatment procedures.   Mechanical debridement of nails 1-5  bilaterally performed with a nail nipper.  Filed with dremel without incident.    Return office visit  3 months                    Told patient to return for periodic foot care and evaluation due to potential at risk complications.   Damyan Corne DPM  

## 2020-10-21 DIAGNOSIS — H401134 Primary open-angle glaucoma, bilateral, indeterminate stage: Secondary | ICD-10-CM | POA: Diagnosis not present

## 2020-10-24 ENCOUNTER — Telehealth: Payer: Self-pay | Admitting: Internal Medicine

## 2020-10-24 NOTE — Telephone Encounter (Addendum)
Pt needs a letter stating she needs to wear a head cover of some sort due to her medical condition. (Hair falling out) when she takes her passport photo. Pt is going to get her passport and they request a letter in order to keep her head covering on.  Pt has already been down there to their office and was turned away. Pt states she is in a hurry to have this done.  Please call when ready.

## 2020-10-24 NOTE — Telephone Encounter (Signed)
Will forward to provider  

## 2020-10-25 NOTE — Telephone Encounter (Signed)
Patient called in asking to speak to Vonna Kotyk Dr Laural Benes assistant in reference to the letter she need for her passport. Say that the faster she get this letter the faster she can get her passport back in eight weeks. Please call  Ph# [phone]

## 2020-10-26 NOTE — Telephone Encounter (Signed)
Patient has been scheduled

## 2020-10-26 NOTE — Telephone Encounter (Signed)
See encounter

## 2020-10-26 NOTE — Telephone Encounter (Signed)
Phone call returned to patient today regarding her request for letter. Patient tells me that she has been having some hair loss at the front of the head for some time which she thinks is likely caused by her medications.  She recently took photos for passport application.  She wore a head scarf because she was too embarrassed about her bald spots.  She goes to the passport office later this week to carry her application.  She states there is a line on the application that if you were having to wear any head covering in the passport photo, she would need a letter from her doctor stating that it is medically necessary. I informed patient that I can write a letter stating that she has reported frontal hair loss and because of that decided to wear a head scarf in her photograph.  I cannot state directly in the letter that I have verified this since she has never brought it to my attention on previous visit.  Patient states she is willing to do an office visit for me to address it but she plans to return in her application this week.  I told patient that most of my appointments are 2 to 4 weeks out.  She still would like the appointment but states she could not wait that long to turn in her application.  She is agreeable to me writing the letter the way I stated above. Message sent to schedulers to get her in for routine f/u visit.

## 2020-10-27 ENCOUNTER — Other Ambulatory Visit: Payer: Self-pay | Admitting: *Deleted

## 2020-10-27 ENCOUNTER — Other Ambulatory Visit: Payer: Self-pay

## 2020-10-27 DIAGNOSIS — Z599 Problem related to housing and economic circumstances, unspecified: Secondary | ICD-10-CM

## 2020-10-27 NOTE — Patient Outreach (Signed)
Medicaid Managed Care   Nurse Care Manager Note  10/27/2020 Name:  Lisa Olson MRN:  [id number] DOB:  09-Apr-1960  Lisa Olson is an 60 y.o. year old female who is a primary patient of Lisa Pier, MD.  The [facility] Managed Care Coordination team was consulted for assistance with:    CAD HTN DMII  Lisa Olson was given information about Medicaid Managed Care Coordination team services today. Lisa Olson Patient agreed to services and verbal consent obtained.  Engaged with patient by telephone for initial visit in response to provider referral for case management and/or care coordination services.   Assessments/Interventions:  Review of past medical history, allergies, medications, health status, including review of consultants reports, laboratory and other test data, was performed as part of comprehensive evaluation and provision of chronic care management services.  SDOH (Social Determinants of Health) assessments and interventions performed: SDOH Interventions    Flowsheet Row Most Recent Value  SDOH Interventions   Housing Interventions Other (Comment)  [Referral for Care Guide for assistance with section 8]  Transportation Interventions Intervention Not Indicated       Care Plan  No Known Allergies  Medications Reviewed Today     Reviewed by Melissa Montane, RN (Registered Nurse) on 10/27/20 at 95  Med List Status: <None>   Medication Order Taking? Sig Documenting Provider Last Dose Status Informant  Accu-Chek FastClix Lancets MISC [id number] Yes Use to check blood sugar once daily. E11.69 Lisa Pier, MD Taking Active   Ascorbic Acid (VITAMIN C ER PO) [id number] Yes Take by mouth. [provider] Taking Active   Blood Glucose Monitoring Suppl (ACCU-CHEK GUIDE ME) w/Device KIT [id number] Yes Use to check blood sugar once daily. E11.69 Lisa Pier, MD Taking Active   carvedilol (COREG) 3.125 MG tablet [id number] Yes Take 1 tablet (3.125 mg total) by mouth 2 (two)  times daily with a meal. Buford Dresser, MD Taking Active   DUREZOL 0.05 % EMUL [id number] Yes Place 1 drop into the right eye 2 (two) times daily. [provider] Taking Active            Med Note ([name] A   Thu Oct 27, 2020 12:54 PM) Once daily  ferrous sulfate 325 (65 FE) MG tablet [id number] Yes Take 1 tablet (325 mg total) by mouth daily with breakfast. Lisa Pier, MD Taking Active            Med Note [name], [name] A   Thu Oct 27, 2020 12:58 PM) Taking at night  furosemide (LASIX) 20 MG tablet [id number] Yes Take 1 tablet (20 mg total) by mouth daily. Stop Hydrochlorothiazide Lisa Pier, MD Taking Active   glimepiride (AMARYL) 4 MG tablet [id number] Yes Take 1 tablet by mouth twice daily Lisa Pier, MD Taking Active   glucose blood test strip [id number] Yes USE 1 STRIP TO CHECK GLUCOSE THREE TIMES DAILY AS DIRECTED Lisa Pier, MD Taking Active   isosorbide mononitrate (IMDUR) 30 MG 24 hr tablet [id number] Yes Take 1 tablet (30 mg total) by mouth daily. Buford Dresser, MD Taking Active   latanoprost (XALATAN) 0.005 % ophthalmic solution [id number] Yes 1 drop at bedtime. [provider] Taking Active   lisinopril (ZESTRIL) 30 MG tablet [id number] Yes Take 1 tablet by mouth once daily Lisa Pier, MD Taking Active   metFORMIN (GLUCOPHAGE) 1000 MG tablet [id number] Yes TAKE 1 TABLET BY MOUTH TWICE DAILY WITH A MEAL Lisa Pier, MD Taking Active  Multiple Vitamins-Minerals (MULTIVITAMIN WITH MINERALS) tablet [id number] Yes Take 1 tablet by mouth daily. [provider] Taking Active   rosuvastatin (CRESTOR) 10 MG tablet [id number] Yes Take 1 tablet (10 mg total) by mouth daily. Buford Dresser, MD Taking Active   timolol (TIMOPTIC) 0.5 % ophthalmic solution [id number] Yes 1 drop every morning. 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, associated with type 2 diabetes mellitus ([place]) 05/22/2018   Nuclear sclerotic cataract of both eyes 02/11/2018   Retinal edema 02/11/2018   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 02/11/2018   Vitreous hemorrhage, right eye ([place]) 02/11/2018   Legal blindness 07/25/2017   Type 2 diabetes mellitus with retinopathy of both eyes, with long-term current use of insulin ([place]) 02/14/2016   Essential hypertension 02/14/2016   Disorder of skin or subcutaneous tissue 04/24/2013   Onychomycosis due to dermatophyte 03/25/2013   Hallux valgus, acquired 03/25/2013   Ulcer of other part of foot 03/25/2013    Conditions to be addressed/monitored per PCP order:  CAD, HTN, and DMII  Care Plan : RN Care Manager Plan of Care  Updates made by Melissa Montane, RN since 10/27/2020 12:00 AM     Problem: Disease management needs related to DMII, HTN and CAD      Long-Range Goal: Development of Plan of Care for needs related to DMII, HTN and CAD   Start Date: 10/27/2020  Expected End Date: 02/24/2021  Priority: High  Note:   Current Barriers:  Chronic Disease Management support and education needs related to CAD, HTN, and DMII-Lisa Olson manages her health with the assistance of her family. She is legally blind. She utilizes Medicaid transportation for medical appointments and [employer] for other errands. Lisa Olson has applied for housing assistance(section 8),  but is unsure if she is on the waiting list. She would like resources for food pantries. Lisa Olson checks her BS 3 x/day and takes all of her prescribed medication as directed. 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   RNCM Clinical Goal(s):  Patient will verbalize understanding of plan for management of CAD, HTN, and DMII work with community resource care guide to address needs related to Financial constraints related to housing and food insecurities    through collaboration with Consulting civil engineer, provider, and care team.  Work with [facility] Pharmacist for medication management  Interventions: Inter-disciplinary care team collaboration (see longitudinal plan of care) Evaluation of current treatment plan related to  self management and patient's adherence to plan as established by provider   CAD  (Status: New goal.) Assessed understanding of CAD diagnosis Medications reviewed including medications utilized in CAD treatment plan Provided education on importance of blood pressure control in management of CAD; Provided education on Importance of limiting foods high in cholesterol; Reviewed Importance of taking  all medications as prescribed Reviewed Importance of attending all scheduled provider appointments  Diabetes:  (Status: New goal.) Lab Results  Component Value Date   HGBA1C 8.1 (A) 04/14/2020  Assessed patient's understanding of A1c goal: <7% Provided education to patient about basic DM disease process; Reviewed medications with patient and discussed importance of medication adherence;        Discussed plans with patient for ongoing care management follow up and provided patient with direct contact information for care management team;      Reviewed scheduled/upcoming provider appointments including: PCP on 10/28/20;         Referral made to pharmacy team for assistance with medication management;       Referral made to community resources care guide  team for assistance with financial insecurities;      Review of patient status, including review of consultants reports, relevant laboratory and other test results, and medications completed;       Assessed social determinant of health barriers;         Hypertension: (Status: New goal.) Last practice recorded BP readings:  BP Readings from Last 3 Encounters:  05/13/20 (!) 142/80  04/14/20 (!) 160/84  10/28/19 134/84  Most recent eGFR/CrCl: No results found for: EGFR  No components found for: CRCL  Evaluation of current treatment plan related to hypertension self management and patient's adherence to plan as established by provider;   Provided education to patient re: stroke prevention, s/s of heart attack and stroke; Reviewed medications with patient and discussed importance of compliance;  Reviewed scheduled/upcoming provider appointments including:  Assessed social determinant of health barriers;   Patient Goals/Self-Care Activities: Patient will self administer medications as prescribed Patient will attend all scheduled provider appointments Patient will call pharmacy for medication refills Patient will call provider office for new concerns or questions Patient will work with Care Guide for financial and food resources Patient will work with MM Pharmacist for medication management       Follow Up:  Patient agrees to Care Plan and Follow-up.  Plan: The Managed Medicaid care management team will reach out to the patient again over the next 30 days.  Date/time of next scheduled RN care management/care coordination outreach:  11/28/20 @ 1pm  Lurena Joiner RN, BSN [place]  [employer] manager

## 2020-10-27 NOTE — Patient Instructions (Signed)
Visit Information  Ms. Avants was given information about Medicaid Managed Care team care coordination services as a part of their [place] Medicaid benefit. [name] verbally consented to engagement with the [facility] Managed Care team.   If you are experiencing a medical emergency, please call 911 or report to your local emergency department or urgent care.   If you have a non-emergency medical problem during routine business hours, please contact your provider's office and ask to speak with a nurse.   For questions related to your [facility], please call: [phone] or visit the homepage here: [email or web address]  If you would like to schedule transportation through your [facility], please call the following number at least 2 days in advance of your appointment: [phone].   Call the [facility] at [phone], at any time, 24 hours a day, 7 days a week. If you are in danger or need immediate medical attention call 911.  If you would like help to quit smoking, call 1-800-QUIT-NOW 515-567-2688) OR Espaol: 1-855-Djelo-Ya (8-325-498-2641) o para ms informacin haga clic aqu or Text READY to 200-400 to register via text  Ms. Hassell Done - following are the goals we discussed in your visit today:   Goals Addressed   None     Please see education materials related to DMII and HTN provided by MyChart link.  Patient verbalizes understanding of instructions provided today.   Telephone follow up appointment with Managed Medicaid care management team member scheduled for:11/28/20 @ [place] RN, [place] RN Care Coordinator   Following is a copy of your plan of care:  Patient Care Plan: RN Care Manager Plan of Care     Problem Identified: Disease management needs related to DMII, HTN and CAD       Long-Range Goal: Development of Plan of Care for needs related to DMII, HTN and CAD   Start Date: 10/27/2020  Expected End Date: 02/24/2021  Priority: High  Note:   Current Barriers:  Chronic Disease Management support and education needs related to CAD, HTN, and DMII-Ms. Langlois manages her health with the assistance of her family. She is legally blind. She utilizes Medicaid transportation for medical appointments and [employer] for other errands. Ms. Lagan has applied for housing assistance(section 8), but is unsure if she is on the waiting list. She would like resources for food pantries. Ms. Chancellor checks her BS 3 x/day and takes all of her prescribed medication as directed. 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   RNCM Clinical Goal(s):  Patient will verbalize understanding of plan for management of CAD, HTN, and DMII work with community resource care guide to address needs related to Financial constraints related to housing and food insecurities    through collaboration with Consulting civil engineer, provider, and care team.  Work with MM Pharmacist for medication management  Interventions: Inter-disciplinary care team collaboration (see longitudinal plan of care) Evaluation of current treatment plan related to  self management and patient's adherence to plan as established by provider   CAD  (Status: New goal.) Assessed understanding of CAD diagnosis Medications reviewed including medications utilized in CAD treatment plan Provided education on importance of blood pressure control in management of CAD; Provided education on Importance of limiting foods high in cholesterol; Reviewed Importance of taking all medications as prescribed Reviewed Importance of attending all scheduled provider appointments  Diabetes:  (  Status: New goal.) Lab Results  Component Value Date   HGBA1C 8.1 (A) 04/14/2020  Assessed patient's understanding of A1c goal: <7% Provided education  to patient about basic DM disease process; Reviewed medications with patient and discussed importance of medication adherence;        Discussed plans with patient for ongoing care management follow up and provided patient with direct contact information for care management team;      Reviewed scheduled/upcoming provider appointments including: PCP on 10/28/20;         Referral made to pharmacy team for assistance with medication management;       Referral made to community resources care guide team for assistance with financial insecurities;      Review of patient status, including review of consultants reports, relevant laboratory and other test results, and medications completed;       Assessed social determinant of health barriers;         Hypertension: (Status: New goal.) Last practice recorded BP readings:  BP Readings from Last 3 Encounters:  05/13/20 (!) 142/80  04/14/20 (!) 160/84  10/28/19 134/84  Most recent eGFR/CrCl: No results found for: EGFR  No components found for: CRCL  Evaluation of current treatment plan related to hypertension self management and patient's adherence to plan as established by provider;   Provided education to patient re: stroke prevention, s/s of heart attack and stroke; Reviewed medications with patient and discussed importance of compliance;  Reviewed scheduled/upcoming provider appointments including:  Assessed social determinant of health barriers;   Patient Goals/Self-Care Activities: Patient will self administer medications as prescribed Patient will attend all scheduled provider appointments Patient will call pharmacy for medication refills Patient will call provider office for new concerns or questions Patient will work with Care Guide for financial and food resources Patient will work with MM Pharmacist for medication management

## 2020-10-28 ENCOUNTER — Other Ambulatory Visit: Payer: Self-pay

## 2020-10-28 ENCOUNTER — Ambulatory Visit: Payer: Medicaid Other | Attending: Internal Medicine | Admitting: Internal Medicine

## 2020-10-28 VITALS — BP 126/77 | HR 78 | Ht 68.0 in | Wt 226.0 lb

## 2020-10-28 DIAGNOSIS — Z6834 Body mass index (BMI) 34.0-34.9, adult: Secondary | ICD-10-CM | POA: Insufficient documentation

## 2020-10-28 DIAGNOSIS — H548 Legal blindness, as defined in USA: Secondary | ICD-10-CM | POA: Insufficient documentation

## 2020-10-28 DIAGNOSIS — E1169 Type 2 diabetes mellitus with other specified complication: Secondary | ICD-10-CM

## 2020-10-28 DIAGNOSIS — E1159 Type 2 diabetes mellitus with other circulatory complications: Secondary | ICD-10-CM | POA: Insufficient documentation

## 2020-10-28 DIAGNOSIS — Z2821 Immunization not carried out because of patient refusal: Secondary | ICD-10-CM | POA: Diagnosis not present

## 2020-10-28 DIAGNOSIS — L659 Nonscarring hair loss, unspecified: Secondary | ICD-10-CM | POA: Insufficient documentation

## 2020-10-28 DIAGNOSIS — E1136 Type 2 diabetes mellitus with diabetic cataract: Secondary | ICD-10-CM | POA: Diagnosis not present

## 2020-10-28 DIAGNOSIS — I2510 Atherosclerotic heart disease of native coronary artery without angina pectoris: Secondary | ICD-10-CM | POA: Diagnosis not present

## 2020-10-28 DIAGNOSIS — I152 Hypertension secondary to endocrine disorders: Secondary | ICD-10-CM

## 2020-10-28 DIAGNOSIS — Z794 Long term (current) use of insulin: Secondary | ICD-10-CM | POA: Insufficient documentation

## 2020-10-28 DIAGNOSIS — Z114 Encounter for screening for human immunodeficiency virus [HIV]: Secondary | ICD-10-CM | POA: Insufficient documentation

## 2020-10-28 DIAGNOSIS — Z833 Family history of diabetes mellitus: Secondary | ICD-10-CM | POA: Diagnosis not present

## 2020-10-28 DIAGNOSIS — Z79899 Other long term (current) drug therapy: Secondary | ICD-10-CM | POA: Insufficient documentation

## 2020-10-28 DIAGNOSIS — E11621 Type 2 diabetes mellitus with foot ulcer: Secondary | ICD-10-CM | POA: Diagnosis not present

## 2020-10-28 DIAGNOSIS — E1142 Type 2 diabetes mellitus with diabetic polyneuropathy: Secondary | ICD-10-CM | POA: Diagnosis present

## 2020-10-28 DIAGNOSIS — I10 Essential (primary) hypertension: Secondary | ICD-10-CM | POA: Diagnosis not present

## 2020-10-28 DIAGNOSIS — E785 Hyperlipidemia, unspecified: Secondary | ICD-10-CM | POA: Diagnosis not present

## 2020-10-28 DIAGNOSIS — Z7984 Long term (current) use of oral hypoglycemic drugs: Secondary | ICD-10-CM | POA: Insufficient documentation

## 2020-10-28 DIAGNOSIS — E669 Obesity, unspecified: Secondary | ICD-10-CM | POA: Diagnosis not present

## 2020-10-28 DIAGNOSIS — Z1159 Encounter for screening for other viral diseases: Secondary | ICD-10-CM | POA: Insufficient documentation

## 2020-10-28 DIAGNOSIS — Z7901 Long term (current) use of anticoagulants: Secondary | ICD-10-CM | POA: Insufficient documentation

## 2020-10-28 LAB — POCT GLYCOSYLATED HEMOGLOBIN (HGB A1C): HbA1c, POC (controlled diabetic range): 7 % (ref 0.0–7.0)

## 2020-10-28 LAB — GLUCOSE, POCT (MANUAL RESULT ENTRY): POC Glucose: 149 mg/dl — AB (ref 70–99)

## 2020-10-28 NOTE — Progress Notes (Signed)
Patient ID: Lisa Olson, female    DOB: November 05, 1960  MRN: [id number]  CC: Diabetes   Subjective: Lisa Olson is a 60 y.o. female who presents for chronic ds management Her concerns today include:  Patient with history of DM type II with associated peripheral neuropathy and retinopathy, legally blind in both eyes, HTN, HL, CAD (medical management), ACD  Pt c/o hair loss frontal area with thinning x few yrs.  Does not use chemical relaxers.  Uses permanent coloring in hair once a yr.  Last done 07/2020.  She thinks thinning of the hair is likely due to her medications.  DM: Results for orders placed or performed in visit on 10/28/20  POCT glucose (manual entry)  Result Value Ref Range   POC Glucose 149 (A) 70 - 99 mg/dl  POCT glycosylated hemoglobin (Hb A1C)  Result Value Ref Range   Hemoglobin A1C     HbA1c POC (<> result, manual entry)     HbA1c, POC (prediabetic range)     HbA1c, POC (controlled diabetic range) 7.0 0.0 - 7.0 %   Taking Amaryl and Metformin Checking BS 2-3x/day.  Gives range 120-140s Doing good with eating habits.  Eating less bread.  Wgh down 5 lbs from last visit Still walks every other day.  HYPERTENSION/CAD Saw Dr. Harrell Gave since last visit with me.  She encouraged her to take Crestor consistently as LDL ws not at goal  She has tarted doing so and has changed eating habits.  She would like to have cholesterol level rechecked today. Currently taking: see medication list Med Adherence: [x]  Yes.  She confirms taking lisinopril, isosorbide, carvedilol, furosemide and Crestor Medication side effects: []  Yes    [x]  No Adherence with salt restriction: []  Yes    []  No Home Monitoring?: []  Yes    []  No Monitoring Frequency:  Home BP results range:  SOB? []  Yes    [x]  No Chest Pain?: []  Yes    [x]  No Leg swelling?: []  Yes    [x]  No Headaches?: []  Yes    [x]  No Dizziness? []  Yes    [x]  No Comments: not on ASA.  Pt states it increases bleeding at back of her eyes  assoc with retinopathy  HM: declines Pneumonia vaccine.  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, associated with type 2 diabetes mellitus ([place]) 05/22/2018   Nuclear sclerotic cataract of both eyes 02/11/2018   Retinal edema 02/11/2018   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 E11.69 100 each 2   Ascorbic Acid (VITAMIN C ER PO) Take by mouth.     Blood Glucose Monitoring Suppl (ACCU-CHEK GUIDE ME) w/Device KIT Use to check blood sugar once daily. E11.69 1 kit 0   carvedilol (COREG) 3.125 MG tablet Take 1 tablet (3.125 mg total) by mouth 2 (two) times daily with a meal. 180 tablet 3   DUREZOL 0.05 % EMUL Place 1 drop into the right eye 2 (two) times daily.     ferrous sulfate 325 (65 FE) MG tablet Take 1 tablet (325 mg total) by mouth daily with breakfast. 100 tablet 3   furosemide (LASIX) 20 MG tablet Take  1 tablet (20 mg total) by mouth daily. Stop Hydrochlorothiazide 30 tablet 6   glimepiride (AMARYL) 4 MG tablet Take 1 tablet by mouth twice daily 60 tablet 3   glucose blood test strip USE 1 STRIP TO CHECK GLUCOSE THREE TIMES DAILY AS DIRECTED 100 each 0   isosorbide mononitrate (IMDUR) 30 MG 24 hr tablet Take 1 tablet (30 mg total) by mouth daily. 90 tablet 3   latanoprost (XALATAN) 0.005 % ophthalmic solution 1 drop at bedtime.     lisinopril (ZESTRIL) 30 MG tablet Take 1 tablet by mouth once daily 30 tablet 0   metFORMIN (GLUCOPHAGE) 1000 MG tablet TAKE 1 TABLET BY MOUTH TWICE DAILY WITH A MEAL 60 tablet 3   Multiple Vitamins-Minerals (MULTIVITAMIN WITH MINERALS) tablet Take 1 tablet by mouth daily.     rosuvastatin (CRESTOR) 10 MG tablet Take 1 tablet (10 mg total) by mouth daily. 90 tablet 3   timolol (TIMOPTIC) 0.5 % ophthalmic solution 1 drop every morning.     No current facility-administered medications on file prior to visit.    No Known Allergies  Social History   Socioeconomic History   Marital status: Single    Spouse name: Not on file   Number of children: Not on file   Years of education: Not on file   Highest education level: Not on file  Occupational History   Not on file  Tobacco Use   Smoking status: Never   Smokeless tobacco: Never  Vaping Use   Vaping Use: Never used  Substance and Sexual Activity   Alcohol use: Not on file   Drug use: No   Sexual activity: Not on file  Other Topics Concern   Not on file  Social History Narrative   Not on file   Social Determinants of Health   Financial Resource Strain: Not on file  Food Insecurity: Not on file  Transportation Needs: No Transportation Needs   Lack of Transportation (Medical): No   Lack of Transportation (Non-Medical): No  Physical Activity: Not on file  Stress: Not on file  Social Connections: Not on file  Intimate Partner Violence: Not on file    Family History  Problem Relation Age of Onset    Diabetes Mother    Diabetes Father    Hypertension Sister     Past Surgical History:  Procedure Laterality Date   EYE SURGERY     LASIK     RETINAL DETACHMENT SURGERY      ROS: Review of Systems Negative except as stated above  PHYSICAL EXAM: BP 126/77   Pulse 78   Ht 5' 8"  (1.727 m)   Wt 226 lb (102.5 kg)   SpO2 98%   BMI 34.36 kg/m   Wt Readings from Last 3 Encounters:  10/28/20 226 lb (102.5  kg)  05/13/20 233 lb 9.6 oz (106 kg)  04/14/20 231 lb 6.4 oz (105 kg)    Physical Exam  General appearance - alert, well appearing, and in no distress.  She has her tap-stick for the blind with her.  She transfers onto the exam table unassisted. Mental status - normal mood, behavior, speech, dress, motor activity, and thought processes Mouth - mucous membranes moist, pharynx normal without lesions Neck - supple, no significant adenopathy Chest - clear to auscultation, no wheezes, rales or rhonchi, symmetric air entry Heart - normal rate, regular rhythm, normal S1, S2, no murmurs, rubs, clicks or gallops Extremities - peripheral pulses normal, no pedal edema, no clubbing or cyanosis Skin -patient wearing a hat which she removed.  She also had a band around the head which was also removed.  Patient noted to have thinning of the hair in the temple areas and on the crown of the head.   CMP Latest Ref Rng & Units 04/14/2020 05/25/2019 06/03/2018  Glucose 65 - 99 mg/dL 240(H) 122(H) 202(H)  BUN 8 - 27 mg/dL 14 18 23   Creatinine 0.57 - 1.00 mg/dL 0.84 0.91 0.88  Sodium 134 - 144 mmol/L 138 138 136  Potassium 3.5 - 5.2 mmol/L 4.3 4.2 4.4  Chloride 96 - 106 mmol/L 99 100 99  CO2 20 - 29 mmol/L 26 28 21   Calcium 8.7 - 10.3 mg/dL 9.2 9.1 9.0  Total Protein 6.0 - 8.5 g/dL 7.4 7.1 6.7  Total Bilirubin 0.0 - 1.2 mg/dL 0.2 0.3 0.3  Alkaline Phos 44 - 121 IU/L 77 70 70  AST 0 - 40 IU/L 15 17 20   ALT 0 - 32 IU/L 20 14 17    Lipid Panel     Component Value Date/Time   CHOL 197  04/14/2020 1402   TRIG 229 (H) 04/14/2020 1402   HDL 40 04/14/2020 1402   CHOLHDL 4.9 (H) 04/14/2020 1402   CHOLHDL 4.7 05/14/2016 1607   VLDL 44 (H) 05/14/2016 1607   LDLCALC 117 (H) 04/14/2020 1402    CBC    Component Value Date/Time   WBC 4.9 04/14/2020 1402   WBC SEE NOTE 02/13/2016 1746   RBC 4.47 04/14/2020 1402   RBC CANCELED 02/13/2016 1746   HGB 11.0 (L) 04/14/2020 1402   HCT 36.1 04/14/2020 1402   PLT 288 04/14/2020 1402   MCV 81 04/14/2020 1402   MCH 24.6 (L) 04/14/2020 1402   MCH CANCELED 02/13/2016 1746   MCHC 30.5 (L) 04/14/2020 1402   MCHC CANCELED 02/13/2016 1746   RDW 12.8 04/14/2020 1402   LYMPHSABS CANCELED 02/13/2016 1746   MONOABS CANCELED 02/13/2016 1746   EOSABS CANCELED 02/13/2016 1746   BASOSABS CANCELED 02/13/2016 1746    ASSESSMENT AND PLAN:  1. Type 2 diabetes mellitus with obesity ([place]) Commended her on getting her A1c down and achieving some weight loss.  Encouraged her to continue current oral medications, healthy eating habits and regular exercise. - POCT glucose (manual entry) - POCT glycosylated hemoglobin (Hb A1C)  2. Hypertension associated with diabetes ([place]) At goal.  Continue current medications and low-salt diet.  3. Hyperlipidemia due to type 2 diabetes mellitus ([place]) Continue Crestor.  Check lipid profile today to see if LDL is now at goal given that she has been more compliant with taking the medication and has made positive changes with her eating habits. - Lipid panel  4. Hair thinning Likely a combination of age, use of chemicals in the hair, medications.  We will  check thyroid level.  She is agreeable to [facility] referral.  Recommend that she may want to stop putting permanent coloring in her hair - TSH+T4F+T3Free - Ambulatory referral to Dermatology  5. Pneumococcal vaccination declined by patient Recommended.  Patient declined.  6. Screening for HIV (human immunodeficiency virus) - HIV antibody (with reflex)  7. Need  for hepatitis C screening test - Hepatitis C Antibody   Patient was given the opportunity to ask questions.  Patient verbalized understanding of the plan and was able to repeat key elements of the plan.   Orders Placed This Encounter  Procedures   POCT glucose (manual entry)   POCT glycosylated hemoglobin (Hb A1C)     Requested Prescriptions    No prescriptions requested or ordered in this encounter    No follow-ups on file.  Karle Plumber, MD, FACP

## 2020-10-29 LAB — LIPID PANEL
Chol/HDL Ratio: 4.2 ratio (ref 0.0–4.4)
Cholesterol, Total: 182 mg/dL (ref 100–199)
HDL: 43 mg/dL (ref >39)
LDL Chol Calc (NIH): 107 mg/dL — ABNORMAL HIGH (ref 0–99)
Triglycerides: 182 mg/dL — ABNORMAL HIGH (ref 0–149)
VLDL Cholesterol Cal: 32 mg/dL (ref 5–40)

## 2020-10-29 LAB — TSH+T4F+T3FREE
Free T4: 1.03 ng/dL (ref 0.82–1.77)
T3, Free: 2.4 pg/mL (ref 2.0–4.4)
TSH: 1.22 u[IU]/mL (ref 0.450–4.500)

## 2020-10-29 LAB — HEPATITIS C ANTIBODY: Hep C Virus Ab: <0.1 s/co ratio (ref 0.0–0.9)

## 2020-10-29 LAB — HIV ANTIBODY (ROUTINE TESTING W REFLEX): HIV Screen 4th Generation wRfx: NONREACTIVE

## 2020-10-29 NOTE — Progress Notes (Signed)
Thyroid level normal.  Screen for hepatitis C and HIV negative.  Cholesterol level improved but still not at goal.  Level is 107 with goal being less than 70.  Please inquire whether or not she has been  taking the rosuvastatin consistently every day.  If she has been taking it consistently then we will need to increase the dose from 10 mg daily to 20 mg daily.  Please let me know her response so that I know whether to send an updated prescription to her pharmacy.

## 2020-11-02 ENCOUNTER — Telehealth: Payer: Self-pay

## 2020-11-02 NOTE — Telephone Encounter (Signed)
-----   Message from Marcine Matar, MD sent at 10/29/2020  3:23 PM EDT ----- Thyroid level normal.  Screen for hepatitis C and HIV negative.  Cholesterol level improved but still not at goal.  Level is 107 with goal being less than 70.  Please inquire whether or not she has been  taking the rosuvastatin consistently every day.  If she has been taking it consistently then we will need to increase the dose from 10 mg daily to 20 mg daily.  Please let me know her response so that I know whether to send an updated prescription to her pharmacy.

## 2020-11-02 NOTE — Telephone Encounter (Signed)
Patient name and DOB has been verified Patient was informed of lab results. Patient had no questions.   Pt states that she has not been taking her medication as she should.

## 2020-11-08 ENCOUNTER — Other Ambulatory Visit: Payer: Self-pay

## 2020-11-08 ENCOUNTER — Telehealth: Payer: Self-pay | Admitting: Internal Medicine

## 2020-11-08 NOTE — Telephone Encounter (Signed)
   Telephone encounter was:  Successful.  11/08/2020 Name: [name] MRN: [id number] DOB: January 21, 1961  Lisa Olson is a 60 y.o. year old female who is a primary care patient of Laural Benes, Binnie Rail, MD . The community resource team was consulted for assistance with Food Insecurity and Financial Difficulties related to paying monthly rent and bills.  Care guide performed the following interventions: Patient provided with information about care guide support team and interviewed to confirm resource needs Spoke with patient, will gather list of local food banks and financial resources. Letter sent to patient .  Follow Up Plan:  No further follow up planned at this time. The patient has been provided with needed resources.  April Green Care Guide, Embedded Care Coordination [facility], Care Management Phone: [phone] Email: [email or web address]

## 2020-11-09 ENCOUNTER — Ambulatory Visit: Payer: Self-pay

## 2020-11-09 ENCOUNTER — Other Ambulatory Visit: Payer: Self-pay | Admitting: Internal Medicine

## 2020-11-09 DIAGNOSIS — I10 Essential (primary) hypertension: Secondary | ICD-10-CM

## 2020-11-09 DIAGNOSIS — E1169 Type 2 diabetes mellitus with other specified complication: Secondary | ICD-10-CM

## 2020-11-09 DIAGNOSIS — E118 Type 2 diabetes mellitus with unspecified complications: Secondary | ICD-10-CM

## 2020-11-09 MED ORDER — FUROSEMIDE 20 MG PO TABS: 20.0000 mg | ORAL_TABLET | Freq: Every day | ORAL | 2 refills | Status: DC

## 2020-11-09 MED ORDER — ROSUVASTATIN CALCIUM 10 MG PO TABS: 10.0000 mg | ORAL_TABLET | Freq: Every day | ORAL | 3 refills | Status: DC

## 2020-11-09 MED ORDER — LISINOPRIL 30 MG PO TABS: 30.0000 mg | ORAL_TABLET | Freq: Every day | ORAL | 3 refills | Status: DC

## 2020-11-09 MED ORDER — ISOSORBIDE MONONITRATE ER 30 MG PO TB24: 30.0000 mg | ORAL_TABLET | Freq: Every day | ORAL | 3 refills | Status: DC

## 2020-11-09 MED ORDER — ACCU-CHEK FASTCLIX LANCETS MISC: 2 refills | Status: DC

## 2020-11-09 MED ORDER — METFORMIN HCL 1000 MG PO TABS: 1000.0000 mg | ORAL_TABLET | Freq: Two times a day (BID) | ORAL | 3 refills | Status: DC

## 2020-11-09 MED ORDER — CARVEDILOL 3.125 MG PO TABS: 3.1250 mg | ORAL_TABLET | Freq: Two times a day (BID) | ORAL | 3 refills | Status: DC

## 2020-11-09 MED ORDER — GLIMEPIRIDE 4 MG PO TABS: 4.0000 mg | ORAL_TABLET | Freq: Two times a day (BID) | ORAL | 3 refills | Status: DC

## 2020-11-09 MED ORDER — ACCU-CHEK GUIDE VI STRP: ORAL_STRIP | 12 refills | Status: DC

## 2020-11-09 NOTE — Patient Outreach (Signed)
Medicaid Managed Care    Pharmacy Note  11/09/2020 Name: Lisa Olson MRN: [id number] DOB: Nov 15, 1960  Lisa Olson is a 60 y.o. year old female who is a primary care patient of [name], MD. The [facility] Managed Care Coordination team was consulted for assistance with disease management and care coordination needs.    Engaged with patient Engaged with patient by telephone for initial visit in response to referral for case management and/or care coordination services.  Lisa Olson was given information about Managed Medicaid Care Coordination team services today. Lisa Olson agreed to services and verbal consent obtained.   Objective:  Lab Results  Component Value Date   CREATININE 0.84 04/14/2020   CREATININE 0.91 05/25/2019   CREATININE 0.88 06/03/2018    Lab Results  Component Value Date   HGBA1C 7.0 10/28/2020       Component Value Date/Time   CHOL 182 10/28/2020 0939   TRIG 182 (H) 10/28/2020 0939   HDL 43 10/28/2020 0939   CHOLHDL 4.2 10/28/2020 0939   CHOLHDL 4.7 05/14/2016 1607   VLDL 44 (H) 05/14/2016 1607   LDLCALC 107 (H) 10/28/2020 0939    Other: (TSH, CBC, Vit D, etc.)  Clinical ASCVD: Yes  The 10-year ASCVD risk score Mikey Bussing DC Jr., et al., 2013) is: 16.4%   Values used to calculate the score:     Age: 26 years     Sex: Female     Is Non-Hispanic African American: Yes     Diabetic: Yes     Tobacco smoker: No     Systolic Blood Pressure: 270 mmHg     Is BP treated: Yes     HDL Cholesterol: 43 mg/dL     Total Cholesterol: 182 mg/dL    Other: (CHADS2VASc if Afib, PHQ9 if depression, MMRC or CAT for COPD, ACT, DEXA)  BP Readings from Last 3 Encounters:  10/28/20 126/77  05/13/20 (!) 142/80  04/14/20 (!) 160/84    Assessment/Interventions: Review of patient past medical history, allergies, medications, health status, including review of consultants reports, laboratory and other test data, was performed as part of comprehensive evaluation and provision of  chronic care management services.   HTN BP Readings from Last 3 Encounters:  10/28/20 126/77  05/13/20 (!) 142/80  04/14/20 (!) 160/84  Carvedilol 3.125 BID ISMN 41m QD Lisinopril 318mQD -Extensive time spent on counseling patient on blood pressure goal and impact of each antihypertensive medication on their blood pressure and risk reduction for CV disease.  Used analogies to explain the need for multiple antihypertensive medications to achieve BP goals and that it is often a silent disease with no symptoms.  Plan: At goal,  patient stable/ symptoms controlled   DM Lab Results  Component Value Date   HGBA1C 7.0 10/28/2020   HGBA1C 8.1 (A) 04/14/2020   HGBA1C 7.8 (A) 05/25/2019   Lab Results  Component Value Date   MICROALBUR 6.9 05/14/2016   LDLCALC 107 (H) 10/28/2020   CREATININE 0.84 04/14/2020    Lab Results  Component Value Date   NA 138 04/14/2020   K 4.3 04/14/2020   CREATININE 0.84 04/14/2020   GFRNONAA 76 04/14/2020   GFRAA 87 04/14/2020   GLUCOSE 240 (H) 04/14/2020    Lab Results  Component Value Date   WBC 4.9 04/14/2020   HGB 11.0 (L) 04/14/2020   HCT 36.1 04/14/2020   MCV 81 04/14/2020   PLT 288 04/14/2020   Metformin 100097mID Glimepiride 4mg32mD -Extensive time was  spent counseling patient on the pathophysiology and risk for complications with uncontrolled Diabetes.  Used teach back method for counseling on ADA diabetic diet.  Time spent on explaining role of carbohydrates versus sugars impacting blood glucose levels and meal planning. Current exercise consists of walking 10 minutes three times a week, plan is to increase to 20 minutes 5 days a week and then after 2-3 weeks increase to 30 minute 5 days a week.  Plan: At goal,  patient stable/ symptoms controlled   Lipids Lab Results  Component Value Date   CHOL 182 10/28/2020   CHOL 197 04/14/2020   CHOL 175 05/25/2019   Lab Results  Component Value Date   HDL 43 10/28/2020   HDL 40  04/14/2020   HDL 42 05/25/2019   Lab Results  Component Value Date   LDLCALC 107 (H) 10/28/2020   LDLCALC 117 (H) 04/14/2020   LDLCALC 97 05/25/2019   Lab Results  Component Value Date   TRIG 182 (H) 10/28/2020   TRIG 229 (H) 04/14/2020   TRIG 211 (H) 05/25/2019   Lab Results  Component Value Date   CHOLHDL 4.2 10/28/2020   CHOLHDL 4.9 (H) 04/14/2020   CHOLHDL 4.2 05/25/2019   No results found for: LDLDIRECT Rosuvastatin 89m QD (Last picked up 04/14/20) -Over 15 minutes spent counseling patient on role of hyperlipidemia on heart disease and the impact of each lipid subclass with emphasis on LDL and heart disease risk.  Explained role of statins in prevention of CV disease complications and actual risk for adverse effects with statin treatment.  Counseled patient on genetic component placing them at risk for hyperlipidemia. Plan: Will ask PCP about trying high intensity  Edema  Furosemide 22mQD Plan: At goal,  patient stable/ symptoms controlled    SDOH (Social Determinants of Health) assessments and interventions performed:  SDOH Interventions    Flowsheet Row Most Recent Value  SDOH Interventions   Financial Strain Interventions Other (Comment)  [Will get meds delivered at beginning of the month when she gets paid to ease burden of affording meds]  Transportation Interventions Other (Comment)  [Will get meds delivered since she told me, "I'm blind in one eye and have trouble driving"]        Care Plan  No Known Allergies  Medications Reviewed Today     Reviewed by KeLane HackerRPSurgcenter Of Western Maryland LLCPharmacist) on 11/09/20 at 1053  Med List Status: <None>   Medication Order Taking? Sig Documenting Provider Last Dose Status Informant  Accu-Chek FastClix Lancets MISC [id number] to check blood sugar once daily. E11.69 JoLadell PierMD  Active   Ascorbic Acid (VITAMIN C ER PO) [id number] by mouth. [provider]  Active   Blood Glucose Monitoring  Suppl (ACCU-CHEK GUIDE ME) w/Device KIT [id number] to check blood sugar once daily. E11.69 JoLadell PierMD  Active   carvedilol (COREG) 3.125 MG tablet [id number] 1 tablet (3.125 mg total) by mouth 2 (two) times daily with a meal. ChBuford DresserMD  Active   DUREZOL 0.05 % EMUL [id number] Place 1 drop into the right eye 2 (two) times daily. [provider] Taking Active            Med Note ([name], [name]   Thu Oct 27, 2020 12:54 PM) Once daily  ferrous sulfate 325 (65 FE) MG tablet [id number] Take 1 tablet (325 mg total) by mouth daily with breakfast. JoLadell PierMD  Taking Active            Med Note ([name], [name]   Thu Oct 27, 2020 12:58 PM) Taking at night  furosemide (LASIX) 20 MG tablet [id number] Yes Take 1 tablet (20 mg total) by mouth daily. Stop Hydrochlorothiazide [name], MD Taking Active   glimepiride (AMARYL) 4 MG tablet [id number] Yes Take 1 tablet by mouth twice daily [name], MD Taking Active   glucose blood test strip [id number]  USE 1 STRIP TO CHECK GLUCOSE THREE TIMES DAILY AS DIRECTED [name], MD  Active   isosorbide mononitrate (IMDUR) 30 MG 24 hr tablet [id number] Yes Take 1 tablet (30 mg total) by mouth daily. Buford Dresser, MD Taking Active   latanoprost (XALATAN) 0.005 % ophthalmic solution [id number] Yes 1 drop at bedtime. [provider] Taking Active   lisinopril (ZESTRIL) 30 MG tablet [id number] Yes Take 1 tablet by mouth once daily [name], MD Taking Active   metFORMIN (GLUCOPHAGE) 1000 MG tablet [id number] Yes TAKE 1 TABLET BY MOUTH TWICE DAILY WITH A MEAL [name], MD Taking Active   Multiple Vitamins-Minerals (MULTIVITAMIN WITH MINERALS) tablet [id number]  Take 1 tablet by mouth daily. [provider]  Active   rosuvastatin (CRESTOR) 10 MG tablet [id number] Yes Take 1 tablet (10 mg total) by mouth daily. Buford Dresser, MD Taking Active    timolol (TIMOPTIC) 0.5 % ophthalmic solution [id number] Yes 1 drop every morning. 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, associated with type 2 diabetes mellitus ([place]) 05/22/2018   Nuclear sclerotic cataract of both eyes 02/11/2018   Retinal edema 02/11/2018   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 02/11/2018   Vitreous hemorrhage, right eye ([place]) 02/11/2018   Legal blindness 07/25/2017   Type 2 diabetes mellitus with retinopathy of both eyes, with long-term current use of insulin ([place]) 02/14/2016   Essential hypertension 02/14/2016   Disorder of skin or subcutaneous tissue 04/24/2013   Onychomycosis due to dermatophyte 03/25/2013   Hallux valgus, acquired 03/25/2013   Ulcer of other part of foot 03/25/2013    Conditions to be addressed/monitored: HTN and DM  Care Plan : Medication management  Updates made by Lane Hacker, [place] since 11/09/2020 12:00 AM     Problem: Health Promotion or Disease Self-Management (General Plan of Care)      Goal: Medication management   Note:   Current Barriers:  Does not adhere to prescribed medication regimen Does not maintain contact with provider office Does not contact provider office for questions/concerns   Pharmacist Clinical Goal(s):  Over the next 90 days, patient will achieve  adherence to monitoring guidelines and medication adherence to achieve therapeutic efficacy contact provider office for questions/concerns as evidenced notation of same in electronic health record through collaboration  with PharmD and provider.    Interventions: Inter-disciplinary care team collaboration (see longitudinal plan of care) Comprehensive medication review performed; medication list updated in electronic medical record      Patient Goals/Self-Care Activities Over the next 90 days, patient will:  - collaborate with provider on medication access solutions  Follow Up Plan: The patient has been provided with contact information for the care management team and has been advised to call with any health related questions or concerns.       Task: Mutually Develop and Royce Macadamia Achievement of Patient Goals   Note:   Care Management Activities:    - verbalization of feelings encouraged    Notes:        Medication Assistance:  Patient has trouble driving to pick up meds due to eyes. Because of this, she is very non-compliant Plan: Verbal consent obtained for [facility] enhanced pharmacy services (medication synchronization, adherence packaging, delivery coordination). A medication sync plan was created to allow patient to get all medications delivered once every 30 to 90 days per patient preference. Patient understands they have freedom to choose pharmacy and clinical pharmacist will coordinate care between all prescribers and [facility].   Medication Name                        (please note if Rx is PRN) Prescriber                                                                  (list Provider Name & Phone Number)                                  Timing    Refill Timing Last Fill Date & DS       (if last fill/DS unavailable, list pt.'s quantity on hand) Anticipated next due date     BB B L EM BT     TS/Lancets Accu-Chek [employer] and Guide Karle Plumber  [phone]      Auto Fill 09/29/20 for #100 11/17/20  Carvedilol 3.125 BID Christopher Bridgette   1  1  Cycle Fill 09/30/20 for 90 days 12/29/20  Durezol 0.05% Eye Sylvester Harder, OD,  [phone]      Auto Fill 10/22/20 for 75 days 01/05/21  Ferrous Sulfate 310m AM DKarle [phone] 1    Cycle Fill Unknown 11/17/20  Furosemide 237mAM ChBuford DresserMD, [phone]    Cycle Fill Unknown 11/17/20  Glimepiride 85m40mID DebKarle [phone]  1  Cycle Fill 09/30/2020 for 30 days 11/17/20  Lisinopril 80m37m DeboKarle [phone]  Cycle Fill 09/30/2020 for 30 days 11/17/20  Metformin 1000mg5m DeborKarle [phone]  Cycle Fill 10/04/20 for 30 days 11/17/20  Rosuvastatin 10mg 32mraKarle [phone] Cycle Fill Never picked up 11/17/20  Timolol 0.5% BID Eye BarborSylvester Harder (336) [phone]      Auto Fill 10/22/20 for 75 days 01/05/21  Lantanoprost 0.005% Eye BarborSylvester Harder [phone] Fill 10/22/20 for  45 days 12/06/20  ISMN 26m/24 HJaquita Rector BShawna Orleans MD, [phone] 1    Cycle Fill Unknown 11/17/20    Follow up: Agree   Plan: The patient has been provided with contact information for the care management team and has been advised to call with any health related questions or concerns.   NArizona Constable Pharm.D., Managed Medicaid Pharmacist - [phone]

## 2020-11-09 NOTE — Patient Instructions (Signed)
Visit Information  Lisa Olson was given information about Medicaid Managed Care team care coordination services as a part of their [place] Medicaid benefit. [name] verbally consented to engagement with the [facility] Managed Care team.   If you are experiencing a medical emergency, please call 911 or report to your local emergency department or urgent care.   If you have a non-emergency medical problem during routine business hours, please contact your provider's office and ask to speak with a nurse.   For questions related to your [facility], please call: [phone] or visit the homepage here: [email or web address]  If you would like to schedule transportation through your [facility], please call the following number at least 2 days in advance of your appointment: [phone].   Call the [place] at [phone], at any time, 24 hours a day, 7 days a week. If you are in danger or need immediate medical attention call 911.  If you would like help to quit smoking, call 1-800-QUIT-NOW (564) 463-9836) OR Espaol: 1-855-Djelo-Ya (1-224-825-0037) o para ms informacin haga clic aqu or Text READY to 200-400 to register via text  Ms. Hassell Done - following are the goals we discussed in your visit today:   Goals Addressed   None     Please see education materials related to DM provided as print materials.   The patient verbalized understanding of instructions provided today and declined a print copy of patient instruction materials.   The Managed Medicaid care management team will reach out to the patient again over the next 90 days.   [place] Constable, Pharm.D., Managed Medicaid Pharmacist [phone]   Following is a copy of your plan of care:  Patient Care Plan: RN Care Manager Plan of Care     Problem Identified: Disease management needs  related to DMII, HTN and CAD      Long-Range Goal: Development of Plan of Care for needs related to DMII, HTN and CAD   Start Date: 10/27/2020  Expected End Date: 02/24/2021  Priority: High  Note:   Current Barriers:  Chronic Disease Management support and education needs related to CAD, HTN, and DMII-Lisa Olson manages her health with the assistance of her family. She is legally blind. She utilizes Medicaid transportation for medical appointments and [employer] for other errands. Lisa Olson has applied for housing assistance(section 8), but is unsure if she is on the waiting list. She would like resources for food pantries. Lisa Olson checks her BS 3 x/day and takes all of her prescribed medication as directed. 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   RNCM Clinical Goal(s):  Patient will verbalize understanding of plan for management of CAD, HTN, and DMII work with community resource care guide to address needs related to Financial constraints related to housing and food insecurities    through collaboration with Consulting civil engineer, provider, and care team.  Work with MM Pharmacist for medication management  Interventions: Inter-disciplinary care team collaboration (see longitudinal plan of care) Evaluation of current treatment plan related to  self management and patient's adherence to plan as established by provider   CAD  (Status: New goal.) Assessed understanding of CAD diagnosis Medications reviewed including medications utilized in CAD treatment plan Provided education on importance of blood pressure control in management of CAD; Provided education on Importance of limiting foods high in cholesterol; Reviewed Importance of taking all medications as prescribed Reviewed Importance  of attending all scheduled provider appointments  Diabetes:  (Status: New goal.) Lab Results  Component Value Date   HGBA1C 8.1 (A) 04/14/2020  Assessed patient's understanding of  A1c goal: <7% Provided education to patient about basic DM disease process; Reviewed medications with patient and discussed importance of medication adherence;        Discussed plans with patient for ongoing care management follow up and provided patient with direct contact information for care management team;      Reviewed scheduled/upcoming provider appointments including: PCP on 10/28/20;         Referral made to pharmacy team for assistance with medication management;       Referral made to community resources care guide team for assistance with financial insecurities;      Review of patient status, including review of consultants reports, relevant laboratory and other test results, and medications completed;       Assessed social determinant of health barriers;         Hypertension: (Status: New goal.) Last practice recorded BP readings:  BP Readings from Last 3 Encounters:  05/13/20 (!) 142/80  04/14/20 (!) 160/84  10/28/19 134/84  Most recent eGFR/CrCl: No results found for: EGFR  No components found for: CRCL  Evaluation of current treatment plan related to hypertension self management and patient's adherence to plan as established by provider;   Provided education to patient re: stroke prevention, s/s of heart attack and stroke; Reviewed medications with patient and discussed importance of compliance;  Reviewed scheduled/upcoming provider appointments including:  Assessed social determinant of health barriers;   Patient Goals/Self-Care Activities: Patient will self administer medications as prescribed Patient will attend all scheduled provider appointments Patient will call pharmacy for medication refills Patient will call provider office for new concerns or questions Patient will work with Care Guide for financial and food resources Patient will work with MM Pharmacist for medication management      Patient Care Plan: Medication management     Problem Identified:  Health Promotion or Disease Self-Management (General Plan of Care)      Goal: Medication management   Note:   Current Barriers:  Does not adhere to prescribed medication regimen Does not maintain contact with provider office Does not contact provider office for questions/concerns   Pharmacist Clinical Goal(s):  Over the next 90 days, patient will achieve adherence to monitoring guidelines and medication adherence to achieve therapeutic efficacy contact provider office for questions/concerns as evidenced notation of same in electronic health record through collaboration with PharmD and provider.    Interventions: Inter-disciplinary care team collaboration (see longitudinal plan of care) Comprehensive medication review performed; medication list updated in electronic medical record      Patient Goals/Self-Care Activities Over the next 90 days, patient will:  - collaborate with provider on medication access solutions  Follow Up Plan: The patient has been provided with contact information for the care management team and has been advised to call with any health related questions or concerns.       Task: Mutually Develop and Lisa Olson Achievement of Patient Goals   Note:   Care Management Activities:    - verbalization of feelings encouraged    Notes:

## 2020-11-28 ENCOUNTER — Other Ambulatory Visit: Payer: Self-pay | Admitting: *Deleted

## 2020-11-28 NOTE — Patient Instructions (Signed)
Visit Information  Ms. Beadie Verville  - as a part of your Medicaid benefit, you are eligible for care management and care coordination services at no cost or copay. I was unable to reach you by phone today but would be happy to help you with your health related needs. Please feel free to call me @  [phone].   A member of the Managed Medicaid care management team will reach out to you again over the next 14 days.   Denasia Venn RN, BSN [place]  [facility] RN Care Coordinator   

## 2020-11-28 NOTE — Patient Outreach (Signed)
Care Coordination  11/28/2020  [name] 12-28-1960 [id number]   Medicaid Managed Care   Unsuccessful Outreach Note  11/28/2020 Name: Lisa Olson MRN: [id number] DOB: 1961-01-07  Referred by: Marcine Matar, MD Reason for referral : High Risk Managed Medicaid (Unsuccessful RNCM follow up outreach)   An unsuccessful telephone outreach was attempted today. The patient was referred to the case management team for assistance with care management and care coordination.   Follow Up Plan: A HIPAA compliant phone message was left for the patient providing contact information and requesting a return call.   Estanislado Emms RN, BSN [place]  [facility]

## 2020-12-06 NOTE — Progress Notes (Signed)
Cardiology Office Note:    Date:  12/08/2020   ID:  Lisa Olson, DOB 02/18/1961, MRN [id number]  PCP:  Johnson, Deborah B, MD  Cardiologist:  Ziyonna Christner, MD  Referring MD: Johnson, Deborah B, MD   CC: follow up.  History of Present Illness:    Lisa Olson is a 60 y.o. 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 I initially saw her 06/03/19 as a new consult at the request of Johnson, Deborah B, MD for the evaluation and management of chest pain.  Today: Overall, she is feeling okay. Fortunately, her LE muscle cramps have not been as prominent since her last visit.   She reports her blood sugar was 81 this morning, and 116 yesterday.  Previously she became short of breath during walks. Now she feels this is at a minimum.  Of note, she continues to be intolerant of rosuvastatin after about a week at a time. Currently she is alternating, and takes rosuvastatin for 1 week and discontinues for the next week.  She wears compression socks regularly.  She denies any palpitations, or chest pain. No lightheadedness, headaches, syncope, orthopnea, or PND. Also has no lower extremity edema.   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 E11.69   Ascorbic Acid (VITAMIN C ER PO) Take by mouth.   Blood Glucose Monitoring Suppl (ACCU-CHEK GUIDE ME) w/Device KIT Use to check blood sugar once daily. E11.69   carvedilol (COREG) 3.125 MG tablet Take 1 tablet (3.125 mg total) by mouth 2 (two) times daily with a  meal.   DUREZOL 0.05 % EMUL Place 1 drop into the right eye 2 (two) times daily.   ferrous sulfate 325 (65 FE) MG tablet Take 1 tablet (325 mg total) by mouth daily with breakfast.   furosemide (LASIX) 20 MG tablet Take 1 tablet (20 mg total) by mouth daily. Stop Hydrochlorothiazide   glimepiride (AMARYL) 4 MG tablet Take 1 tablet (4 mg total) by mouth 2 (two) times daily.   glucose blood (ACCU-CHEK GUIDE) test strip Check blood sugars once a day.  Accu-chek Guide Me   isosorbide mononitrate (IMDUR) 30 MG 24 hr tablet Take 1 tablet (30 mg total) by mouth daily.   latanoprost (XALATAN) 0.005 % ophthalmic solution 1 drop at bedtime.   lisinopril (ZESTRIL) 30 MG tablet Take 1 tablet (30 mg total) by mouth daily.   metFORMIN (GLUCOPHAGE) 1000 MG tablet Take 1 tablet (1,000 mg total) by mouth 2 (two) times daily with a meal.   Multiple Vitamins-Minerals (MULTIVITAMIN WITH MINERALS) tablet Take 1 tablet by mouth daily.   rosuvastatin (CRESTOR) 10 MG tablet Take 1 tablet (10 mg total) by mouth daily.   timolol (TIMOPTIC) 0.5 % ophthalmic solution 1 drop every morning.   No current facility-administered medications on file prior to visit.     Allergies:   Patient has no known allergies.   Social History   Tobacco Use   Smoking status: Never     Smokeless tobacco: Never  Vaping Use   Vaping Use: Never used  Substance Use Topics   Drug use: No    Family History: family history includes Diabetes in her father and mother; Hypertension in her sister.  ROS:   Please see the history of present illness.   Additional pertinent ROS unremarkable.   EKGs/Labs/Other Studies Reviewed:    The following studies were reviewed today:  Nuclear stress 06/17/19 Nuclear stress EF: 70%. The left ventricular ejection fraction is hyperdynamic (>65%). There was no ST segment deviation noted during stress. Findings consistent with ischemia. This is an intermediate risk study.   Moderate, reversible,  medium size defect in the apical septum and anteroseptum, and the mid anterior wall, that returns to normal at rest. Findings consistent with ischemia.   Echo 09/04/18 1. The left ventricle has normal systolic function with an ejection  fraction of 60-65%. The cavity size was normal. There is mildly increased  left ventricular wall thickness. Left ventricular diastolic Doppler  parameters are consistent with impaired  relaxation. Indeterminate filling pressures The E/e' is 8-15.   2. The right ventricle has normal systolic function. The cavity was  normal. There is no increase in right ventricular wall thickness.   3. The mitral valve is abnormal. Mild thickening of the mitral valve  leaflet. There is mild mitral annular calcification present. No evidence  of mitral valve stenosis.   4. The tricuspid valve is grossly normal.   5. The aortic valve is abnormal. Mild sclerosis of the aortic valve. No  stenosis of the aortic valve.   6. The aortic root and ascending aorta are normal in size and structure.   SUMMARY     Technically difficult study. Reduced echo windows. LVEF 60-65%, mild  LVH, normal wall motion, grade 1 DD, indeterminate LV filling  pressure, normal biatrial size, trivial MR, normal IVC, no  pericardial effusion.   EKG:  EKG is personally reviewed.   12/08/2020: NSR at 83 bpm 06/03/19: NSR, nonspecific inferolateral t wave flattening/inversion  Recent Labs: 04/14/2020: ALT 20; BUN 14; Creatinine, Ser 0.84; Hemoglobin 11.0; Platelets 288; Potassium 4.3; Sodium 138 10/28/2020: TSH 1.220   Recent Lipid Panel    Component Value Date/Time   CHOL 182 10/28/2020 0939   TRIG 182 (H) 10/28/2020 0939   HDL 43 10/28/2020 0939   CHOLHDL 4.2 10/28/2020 0939   CHOLHDL 4.7 05/14/2016 1607   VLDL 44 (H) 05/14/2016 1607   LDLCALC 107 (H) 10/28/2020 0939    Physical Exam:    VS:  BP 134/78   Pulse 83   Ht 5' 7" (1.702 m)   Wt 230 lb (104.3 kg)   SpO2 93%   BMI 36.02 kg/m      Wt Readings from Last 3 Encounters:  12/08/20 230 lb (104.3 kg)  10/28/20 226 lb (102.5 kg)  05/13/20 233 lb 9.6 oz (106 kg)    GEN: Well nourished, well developed in no acute distress HEENT: Normal, moist mucous membranes NECK: No JVD CARDIAC: regular rhythm, normal S1 and S2, no rubs or gallops. No murmur. VASCULAR: Radial and DP pulses 2+ bilaterally. No carotid bruits RESPIRATORY:  Clear to auscultation without rales, wheezing or rhonchi  ABDOMEN: Soft, non-tender, non-distended MUSCULOSKELETAL:  Ambulates independently SKIN: Warm and dry, no edema NEUROLOGIC:  Alert and oriented x 3. Legally blind PSYCHIATRIC:  Normal affect   ASSESSMENT:    1. Coronary artery disease involving native coronary artery of native heart without angina pectoris   2. Mixed   hyperlipidemia   3. Essential hypertension   4. Type 2 diabetes mellitus with complication, without long-term current use of insulin (HCC)   5. Counseling on health promotion and disease prevention     PLAN:    Nonspecific chest pain, dyspnea on exertion: now resolved -Lexiscan with moderate reversible ischemia, consistent with CAD -discussed options, elected medical management and doing well with this. -tolerating imdur, no further chest discomfort -tolerating rosuvastatin, discussed dosing of this, below -no aspirin given prior retinal hemorrhages   Mixed hyperlipidemia:  -goal LDL <70 given abnormal stress test, have been above goal -tolerating change to rosuvastatin, but not taking routinely. She takes for one week/skips for a week. Discussed other ways to try to take rosuvastatin that will lead to more consistency   Hypertension: goal <130/80. Above goal today slightly -home BP cuff matches office, checked previously -reports home numbers are well controlled. Brought BP cuff today but no log available -discussed options for management. After shared decision making, she wishes to continue current regimen and  reassess at follow up -continue carvedilol 3.125 mg BID, lisinopril 30 mg daily, imdur 30 mg daily -on furosemide 20 mg daily   Type II diabetes: A1c 7.0 per [facility], on metformin and glimeperide. Not on insulin -complicated by neuropathy and retinopathy, legally blind -consider SGLT2i/GLP1RA given presumed CAD. Does not wish to change at this time, continue to address   Cardiac risk counseling and prevention recommendations: -recommend heart healthy/Mediterranean diet, with whole grains, fruits, vegetable, fish, lean meats, nuts, and olive oil. Limit salt. -limited exercise ability -recommend avoidance of tobacco products. Avoid excess alcohol. -ASCVD risk score: The 10-year ASCVD risk score (Arnett DK, et al., 2019) is: 19.2%   Values used to calculate the score:     Age: 60 years     Sex: Female     Is Non-Hispanic African American: Yes     Diabetic: Yes     Tobacco smoker: No     Systolic Blood Pressure: 134 mmHg     Is BP treated: Yes     HDL Cholesterol: 43 mg/dL     Total Cholesterol: 182 mg/dL    Plan for follow up: 6 mos or sooner PRN  Bridgette Christopher, MD, PhD [place]  [facility]    Medication Adjustments/Labs and Tests Ordered: Current medicines are reviewed at length with the patient today.  Concerns regarding medicines are outlined above.   Orders Placed This Encounter  Procedures   EKG 12-Lead    No orders of the defined types were placed in this encounter.  Patient Instructions  Medication Instructions:  Your Physician recommend you continue on your current medication as directed.    *If you need a refill on your cardiac medications before your next appointment, please call your pharmacy*   Lab Work: None ordered today   Testing/Procedures: None ordered today   Follow-Up: At [facility], you and your health needs are our priority.  As part of our continuing mission to provide you with exceptional heart care, we have created  designated Provider Care Teams.  These Care Teams include your primary Cardiologist (physician) and Advanced Practice Providers (APPs -  Physician Assistants and Nurse Practitioners) who all work together to provide you with the care you need, when you need it.  We recommend signing up for the patient portal called "MyChart".  Sign up information is provided on this After Visit Summary.  MyChart is used to connect with patients for Virtual Visits (Telemedicine).  Patients are   able to view lab/test results, encounter notes, upcoming appointments, etc.  Non-urgent messages can be sent to your provider as well.   To learn more about what you can do with MyChart, go to [email or web address].    Your next appointment:   6 month(s)  The format for your next appointment:   In Person  Provider:   Bridgette Christopher, MD    I,Mathew Stumpf,acting as a scribe for Bridgette Christopher, MD.,have documented all relevant documentation on the behalf of Bridgette Christopher, MD,as directed by  Bridgette Christopher, MD while in the presence of Bridgette Christopher, MD.  I, Bridgette Christopher, MD, have reviewed all documentation for this visit. The documentation on 01/09/21 for the exam, diagnosis, procedures, and orders are all accurate and complete.   Signed, Bridgette Christopher, MD PhD 12/08/2020  [facility] 

## 2020-12-07 ENCOUNTER — Other Ambulatory Visit: Payer: Self-pay | Admitting: *Deleted

## 2020-12-07 ENCOUNTER — Other Ambulatory Visit: Payer: Self-pay

## 2020-12-07 NOTE — Telephone Encounter (Signed)
Hi Lisa Olson,  Absolutely.Marland KitchenMarland KitchenI will pull the letter from her chart and resend. Thanks!

## 2020-12-07 NOTE — Patient Outreach (Signed)
Medicaid Managed Care   Nurse Care Manager Note  12/07/2020 Name:  Lisa Olson MRN:  [id number] DOB:  [birth year]  Lisa Olson is an 60 y.o. year old female who is a primary patient of Lisa Pier, MD.  [facility] Managed Care Coordination team was consulted for assistance with:    CAD HLD DMII  Lisa Olson was given information about Medicaid Managed Care Coordination team services today. [name] Patient agreed to services and verbal consent obtained.  Engaged with patient by telephone for follow up visit in response to provider referral for case management and/or care coordination services.   Assessments/Interventions:  Review of past medical history, allergies, medications, health status, including review of consultants reports, laboratory and other test data, was performed as part of comprehensive evaluation and provision of chronic care management services.  SDOH (Social Determinants of Health) assessments and interventions performed: SDOH Interventions    Flowsheet Row Most Recent Value  SDOH Interventions   Food Insecurity Interventions Other (Comment)  [Care Guide referral for assistance with food pantries]  Transportation Interventions --  [Patient uses public transportation for errands, [facility] for medical transportation, [facility] for medication delivery]       Care Plan  No Known Allergies  Medications Reviewed Today     Reviewed by Melissa Montane, RN (Registered Nurse) on 12/07/20 at 1338  Med List Status: <None>   Medication Order Taking? Sig Documenting Provider Last Dose Status Informant  Accu-Chek FastClix Lancets MISC [id number] Yes Use to check blood sugar once daily. E11.69 Lisa Pier, MD Taking Active   Ascorbic Acid (VITAMIN C ER PO) [id number] Yes Take by mouth. [provider] Taking Active   Blood Glucose Monitoring Suppl (ACCU-CHEK GUIDE ME) w/Device KIT [id number] Yes Use to check blood sugar once daily. E11.69 Lisa Pier, MD Taking  Active   carvedilol (COREG) 3.125 MG tablet [id number] Yes Take 1 tablet (3.125 mg total) by mouth 2 (two) times daily with a meal. Lisa Pier, MD Taking Active   DUREZOL 0.05 % EMUL [id number] Yes Place 1 drop into the right eye 2 (two) times daily. [provider] Taking Active            Med Note ([name] A   Thu Oct 27, 2020 12:54 PM) Once daily  ferrous sulfate 325 (65 FE) MG tablet [id number] Yes Take 1 tablet (325 mg total) by mouth daily with breakfast. Lisa Pier, MD Taking Active            Med Note [name], [name] A   Thu Oct 27, 2020 12:58 PM) Taking at night  furosemide (LASIX) 20 MG tablet [id number] Yes Take 1 tablet (20 mg total) by mouth daily. Stop Hydrochlorothiazide Lisa Pier, MD Taking Active   glimepiride (AMARYL) 4 MG tablet [id number] Yes Take 1 tablet (4 mg total) by mouth 2 (two) times daily. Lisa Pier, MD Taking Active   glucose blood (ACCU-CHEK GUIDE) test strip [id number] Yes Check blood sugars once a day.  Accu-chek Guide Me Lisa Pier, MD Taking Active   isosorbide mononitrate (IMDUR) 30 MG 24 hr tablet [id number] Yes Take 1 tablet (30 mg total) by mouth daily. Lisa Pier, MD Taking Active   latanoprost (XALATAN) 0.005 % ophthalmic solution [id number] Yes 1 drop at bedtime. [provider] Taking Active   lisinopril (ZESTRIL) 30 MG tablet [id number] Yes Take 1 tablet (30 mg total) by mouth daily. Lisa Pier, MD Taking Active  metFORMIN (GLUCOPHAGE) 1000 MG tablet [id number] Yes Take 1 tablet (1,000 mg total) by mouth 2 (two) times daily with a meal. Lisa Pier, MD Taking Active   Multiple Vitamins-Minerals (MULTIVITAMIN WITH MINERALS) tablet [id number] Yes Take 1 tablet by mouth daily. [provider] Taking Active   rosuvastatin (CRESTOR) 10 MG tablet [id number] Yes Take 1 tablet (10 mg total) by mouth daily. Lisa Pier, MD Taking Active   timolol (TIMOPTIC) 0.5 % ophthalmic  solution [id number] Yes 1 drop every morning. 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, associated with type 2 diabetes mellitus ([place]) 05/22/2018   Nuclear sclerotic cataract of both eyes 02/11/2018   Retinal edema 02/11/2018   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 02/11/2018   Vitreous hemorrhage, right eye ([place]) 02/11/2018   Legal blindness 07/25/2017   Type 2 diabetes mellitus with retinopathy of both eyes, with long-term current use of insulin ([place]) 02/14/2016   Essential hypertension 02/14/2016   Disorder of skin or subcutaneous tissue 04/24/2013   Onychomycosis due to dermatophyte 03/25/2013   Hallux valgus, acquired 03/25/2013   Ulcer of other part of foot 03/25/2013    Conditions to be addressed/monitored per PCP order:  CAD, HLD, and DMII  Care Plan : RN Care Manager Plan of Care  Updates made by Melissa Montane, RN since 12/07/2020 12:00 AM     Problem: Disease management needs related to DMII, HTN and CAD      Long-Range Goal: Development of Plan of Care for needs related to DMII, HTN and CAD   Start Date: 10/27/2020  Expected End Date: 02/24/2021  Priority: High  Note:   Current Barriers:  Chronic Disease Management support and education needs related to CAD, HTN, and DMII-Lisa Olson manages her health with the  assistance of her family. She is legally blind. She utilizes Medicaid transportation for medical appointments and [employer] for other errands. Lisa Olson has applied for housing assistance(section 8), but is unsure if she is on the waiting list. She would like resources for food pantries. Ms. Kantor checks her BS 3 x/day and takes all of her prescribed medication as directed. She does not exercise, but tries to eat a healthy diet.-Update-Ms. Ucci did not receive resources from [employer] for food pantries. She is getting her medications delivered to her and is thankful for this benefit.  Film/video editor.   RNCM Clinical Goal(s):  Patient will verbalize understanding of plan for management of CAD, HTN, and DMII work with community resource care guide to address needs related to Financial constraints related to housing and food insecurities    through collaboration with Consulting civil engineer, provider, and care team.  Work with [facility] Pharmacist for medication management-[facility]  Interventions: Inter-disciplinary care team collaboration (see longitudinal plan of care) Evaluation of current treatment  plan related to  self management and patient's adherence to plan as established by provider   CAD  (Status: Goal on track: YES.) Assessed understanding of CAD diagnosis Medications reviewed including medications utilized in CAD treatment plan Provided education on importance of blood pressure control in management of CAD; Provided education on Importance of limiting foods high in cholesterol; Reviewed Importance of taking all medications as prescribed Reviewed Importance of attending all scheduled provider appointments  Diabetes:  (Status: Goal on track: YES.) Lab Results  Component Value Date   HGBA1C 7.0 10/28/2020  Assessed patient's understanding of A1c goal: <7% Provided education to patient about basic DM disease process; Reviewed medications with patient and discussed importance of medication adherence;         Discussed plans with patient for ongoing care management follow up and provided patient with direct contact information for care management team;      Reviewed scheduled/upcoming provider appointments including: Cardiology 9/22 and PCP on 02/07/21;              Review of patient status, including review of consultants reports, relevant laboratory and other test results, and medications completed;       Assessed social determinant of health barriers;       -will ask Care Guide to resend list of resources in the mail  Hypertension: (Status: Goal on track: YES.) Last practice recorded BP readings:  BP Readings from Last 3 Encounters:  10/28/20 126/77  05/13/20 (!) 142/80  04/14/20 (!) 160/84  Most recent eGFR/CrCl: No results found for: EGFR  No components found for: CRCL  Evaluation of current treatment plan related to hypertension self management and patient's adherence to plan as established by provider;   Provided education to patient re: stroke prevention, s/s of heart attack and stroke; Reviewed medications with patient and discussed importance of compliance;  Reviewed scheduled/upcoming provider appointments including: Cardiology 9/22 and PCP on 02/07/21; Assessed social determinant of health barriers; -will ask Care Guide to resend list of resources in the mail  Patient Goals/Self-Care Activities: Patient will self administer medications as prescribed Patient will attend all scheduled provider appointments Patient will call pharmacy for medication refills Patient will call provider office for new concerns or questions Patient will work with Care Guide for financial and food resources Patient will work with MM Pharmacist for medication management       Follow Up:  Patient agrees to Care Plan and Follow-up.  Plan: The Managed Medicaid care management team will reach out to the patient again over the next 60 days.  Date/time of next scheduled RN care management/care  coordination outreach:  02/06/21 @ [place] RN, BSN [place]  [employer] manager

## 2020-12-07 NOTE — Patient Instructions (Signed)
Visit Information  Lisa Olson was given information about Medicaid Managed Care team care coordination services as a part of their [place] Medicaid benefit. Lisa Olson verbally consented to engagement with the [facility] Managed Care team.   If you are experiencing a medical emergency, please call 911 or report to your local emergency department or urgent care.   If you have a non-emergency medical problem during routine business hours, please contact your provider's office and ask to speak with a nurse.   For questions related to your [facility], please call: [phone] or visit the homepage here: [email or web address]  If you would like to schedule transportation through your [facility], please call the following number at least 2 days in advance of your appointment: [phone].   Call the [place] at [phone], at any time, 24 hours a day, 7 days a week. If you are in danger or need immediate medical attention call 911.  If you would like help to quit smoking, call 1-800-QUIT-NOW 848-343-6286) OR Espaol: 1-855-Djelo-Ya (0-539-767-3419) o para ms informacin haga clic aqu or Text READY to 200-400 to register via text  Lisa Olson - following are the goals we discussed in your visit today:   Goals Addressed   None     Please see education materials related to diabetes and HLD provided as print materials.   The patient verbalized understanding of instructions provided today and agreed to receive a mailed copy of patient instruction and/or educational materials.  Telephone follow up appointment with Managed Medicaid care management team member scheduled for:02/06/21 @ [place] RN, BSN [place] RN Care Coordinator   Following is a copy of your plan of care:  Patient Care Plan: RN Care Manager Plan  of Care     Problem Identified: Disease management needs related to DMII, HTN and CAD      Long-Range Goal: Development of Plan of Care for needs related to DMII, HTN and CAD   Start Date: 10/27/2020  Expected End Date: 02/24/2021  Priority: High  Note:   Current Barriers:  Chronic Disease Management support and education needs related to CAD, HTN, and DMII-Lisa Olson manages her health with the assistance of her family. She is legally blind. She utilizes Medicaid transportation for medical appointments and SCAT for other errands. Lisa Olson has applied for housing assistance(section 8), but is unsure if she is on the waiting list. She would like resources for food pantries. Lisa Olson checks her BS 3 x/day and takes all of her prescribed medication as directed. She does not exercise, but tries to eat a healthy diet.-Update-Lisa Olson did not receive resources from [employer] for food pantries. She is getting her medications delivered to her and is thankful for this benefit.  Film/video editor.   RNCM Clinical Goal(s):  Patient will verbalize understanding of plan for management of CAD, HTN, and DMII work with community resource care guide to address needs related to Financial constraints related to housing and food insecurities    through collaboration with Consulting civil engineer, provider, and care team.  Work with MM Pharmacist for medication management-Met  Interventions: Inter-disciplinary care team collaboration (see longitudinal plan of care) Evaluation of current treatment plan related to  self management and patient's adherence to plan as established by provider   CAD  (Status: Goal on track: YES.) Assessed understanding of CAD diagnosis Medications reviewed including medications utilized in CAD  treatment plan Provided education on importance of blood pressure control in management of CAD; Provided education on Importance of limiting foods high in cholesterol; Reviewed Importance of taking all  medications as prescribed Reviewed Importance of attending all scheduled provider appointments  Diabetes:  (Status: Goal on track: YES.) Lab Results  Component Value Date   HGBA1C 7.0 10/28/2020  Assessed patient's understanding of A1c goal: <7% Provided education to patient about basic DM disease process; Reviewed medications with patient and discussed importance of medication adherence;        Discussed plans with patient for ongoing care management follow up and provided patient with direct contact information for care management team;      Reviewed scheduled/upcoming provider appointments including: Cardiology 9/22 and PCP on 02/07/21;              Review of patient status, including review of consultants reports, relevant laboratory and other test results, and medications completed;       Assessed social determinant of health barriers;       -will ask Care Guide to resend list of resources in the mail  Hypertension: (Status: Goal on track: YES.) Last practice recorded BP readings:  BP Readings from Last 3 Encounters:  10/28/20 126/77  05/13/20 (!) 142/80  04/14/20 (!) 160/84  Most recent eGFR/CrCl: No results found for: EGFR  No components found for: CRCL  Evaluation of current treatment plan related to hypertension self management and patient's adherence to plan as established by provider;   Provided education to patient re: stroke prevention, s/s of heart attack and stroke; Reviewed medications with patient and discussed importance of compliance;  Reviewed scheduled/upcoming provider appointments including: Cardiology 9/22 and PCP on 02/07/21; Assessed social determinant of health barriers; -will ask Care Guide to resend list of resources in the mail  Patient Goals/Self-Care Activities: Patient will self administer medications as prescribed Patient will attend all scheduled provider appointments Patient will call pharmacy for medication refills Patient will call provider  office for new concerns or questions Patient will work with Care Guide for financial and food resources Patient will work with MM Pharmacist for medication management      Patient Care Plan: Medication management     Problem Identified: Health Promotion or Disease Self-Management (General Plan of Care)      Goal: Medication management   Note:   Current Barriers:  Does not adhere to prescribed medication regimen Does not maintain contact with provider office Does not contact provider office for questions/concerns   Pharmacist Clinical Goal(s):  Over the next 90 days, patient will achieve adherence to monitoring guidelines and medication adherence to achieve therapeutic efficacy contact provider office for questions/concerns as evidenced notation of same in electronic health record through collaboration with PharmD and provider.    Interventions: Inter-disciplinary care team collaboration (see longitudinal plan of care) Comprehensive medication review performed; medication list updated in electronic medical record    Patient Goals/Self-Care Activities Over the next 90 days, patient will:  - collaborate with provider on medication access solutions  Follow Up Plan: The patient has been provided with contact information for the care management team and has been advised to call with any health related questions or concerns.

## 2020-12-08 ENCOUNTER — Ambulatory Visit (INDEPENDENT_AMBULATORY_CARE_PROVIDER_SITE_OTHER): Payer: Medicaid Other | Admitting: Cardiology

## 2020-12-08 ENCOUNTER — Other Ambulatory Visit: Payer: Self-pay

## 2020-12-08 ENCOUNTER — Encounter (HOSPITAL_BASED_OUTPATIENT_CLINIC_OR_DEPARTMENT_OTHER): Payer: Self-pay | Admitting: Cardiology

## 2020-12-08 VITALS — BP 134/78 | HR 83 | Ht 67.0 in | Wt 230.0 lb

## 2020-12-08 DIAGNOSIS — Z7189 Other specified counseling: Secondary | ICD-10-CM

## 2020-12-08 DIAGNOSIS — E782 Mixed hyperlipidemia: Secondary | ICD-10-CM | POA: Diagnosis not present

## 2020-12-08 DIAGNOSIS — I2510 Atherosclerotic heart disease of native coronary artery without angina pectoris: Secondary | ICD-10-CM

## 2020-12-08 DIAGNOSIS — I10 Essential (primary) hypertension: Secondary | ICD-10-CM | POA: Diagnosis not present

## 2020-12-08 DIAGNOSIS — E118 Type 2 diabetes mellitus with unspecified complications: Secondary | ICD-10-CM

## 2020-12-08 NOTE — Patient Instructions (Signed)

## 2020-12-12 ENCOUNTER — Ambulatory Visit: Payer: Self-pay

## 2021-01-02 ENCOUNTER — Ambulatory Visit (INDEPENDENT_AMBULATORY_CARE_PROVIDER_SITE_OTHER): Payer: Medicaid Other | Admitting: Podiatry

## 2021-01-02 ENCOUNTER — Encounter: Payer: Self-pay | Admitting: Podiatry

## 2021-01-02 ENCOUNTER — Other Ambulatory Visit: Payer: Self-pay

## 2021-01-02 DIAGNOSIS — E119 Type 2 diabetes mellitus without complications: Secondary | ICD-10-CM | POA: Diagnosis not present

## 2021-01-02 DIAGNOSIS — M79674 Pain in right toe(s): Secondary | ICD-10-CM | POA: Diagnosis not present

## 2021-01-02 DIAGNOSIS — B351 Tinea unguium: Secondary | ICD-10-CM

## 2021-01-02 DIAGNOSIS — M2010 Hallux valgus (acquired), unspecified foot: Secondary | ICD-10-CM | POA: Diagnosis not present

## 2021-01-02 DIAGNOSIS — M79675 Pain in left toe(s): Secondary | ICD-10-CM

## 2021-01-02 NOTE — Progress Notes (Signed)
This patient returns to my office for at risk foot care.  This patient requires this care by a professional since this patient will be at risk due to having type 2 diabetes.    This patient is unable to cut nails herself since the patient cannot reach her nails.These nails are painful walking and wearing shoes.  This patient presents for at risk foot care today.  General Appearance  Alert, conversant and in no acute stress.  Vascular  Dorsalis pedis and posterior tibial  pulses are  Weakly palpable  bilaterally.  Capillary return is within normal limits  bilaterally. Temperature is within normal limits  bilaterally.  Neurologic  Senn-Weinstein monofilament wire test within normal limits  bilaterally. Muscle power within normal limits bilaterally.  Nails Thick disfigured discolored nails with subungual debris  from hallux to fifth toes bilaterally. No evidence of bacterial infection or drainage bilaterally.  Orthopedic  No limitations of motion  feet .  No crepitus or effusions noted.  No bony pathology or digital deformities noted. Mild  HAV  B/L.  Skin  normotropic skin with no porokeratosis noted bilaterally.  No signs of infections or ulcers noted.     Onychomycosis  Pain in right toes  Pain in left toes  Consent was obtained for treatment procedures.   Mechanical debridement of nails 1-5  bilaterally performed with a nail nipper.  Filed with dremel without incident.    Return office visit  3 months                    Told patient to return for periodic foot care and evaluation due to potential at risk complications.   Ivie Savitt DPM  

## 2021-01-24 ENCOUNTER — Other Ambulatory Visit: Payer: Self-pay

## 2021-01-24 NOTE — Patient Instructions (Signed)
Visit Information  Ms. Rogala was given information about Medicaid Managed Care team care coordination services as a part of their [facility] Community Plan Medicaid benefit. Dorita Sciara verbally consented to engagement with the [facility] Managed Care team.   If you are experiencing a medical emergency, please call 911 or report to your local emergency department or urgent care.   If you have a non-emergency medical problem during routine business hours, please contact your provider's office and ask to speak with a nurse.   For questions related to your [facility], please call: [phone] or visit the homepage here: [email or web address]  If you would like to schedule transportation through your [facility], please call the following number at least 2 days in advance of your appointment: [phone].   Call the Behavioral Health Crisis Line at [phone], at any time, 24 hours a day, 7 days a week. If you are in danger or need immediate medical attention call 911.  If you would like help to quit smoking, call 1-800-QUIT-NOW (226-660-0815) OR Espaol: 1-855-Djelo-Ya (9-753-005-1102) o para ms informacin haga clic aqu or Text READY to 111-735 to register via text  Ms. Caryn Section - following are the goals we discussed in your visit today:   Goals Addressed   None     Social Worker will follow up with patient in 30 days .   Gus Puma, BSW, [place] [facility]    High Risk Managed Medicaid Team  [phone]   Following is a copy of your plan of care:  There are no care plans that you recently modified to display for this patient.

## 2021-01-24 NOTE — Patient Outreach (Signed)
Medicaid Managed Care Social Work Note  01/24/2021 Name:  Lisa Olson MRN:  [id number] DOB:  05-Apr-1960  Lisa Olson is an 60 y.o. year old female who is a primary patient of [name], MD.  The [facility] Managed Care Coordination team was consulted for assistance with:   eye doctors   Lisa Olson was given information about Medicaid Managed Care Coordination team services today. Lisa Olson Patient agreed to services and verbal consent obtained.  Engaged with patient  for by telephone forinitial visit in response to referral for case management and/or care coordination services.   Assessments/Interventions:  Review of past medical history, allergies, medications, health status, including review of consultants reports, laboratory and other test data, was performed as part of comprehensive evaluation and provision of chronic care management services.  SDOH: (Social Determinant of Health) assessments and interventions performed:  BSW contacted patient regarding a referral received for eye doctors that accept Medicaid. Patient stated she would like options in [facility] and [place]. BSW provided patient with 6 resources. Patient stated no other assistance/resources are needed at this time.   Advanced Directives Status:  Not addressed in this encounter.  Care Plan                 No Known Allergies  Medications Reviewed Today     Reviewed by Gardiner Barefoot, DPM (Physician) on 01/02/21 at 45  Med List Status: <None>   Medication Order Taking? Sig Documenting Provider Last Dose Status Informant  Accu-Chek FastClix Lancets MISC [id number] No Use to check blood sugar once daily. E11.69 [name], MD Taking Active   Ascorbic Acid (VITAMIN C ER PO) [id number] No Take by mouth. [provider] Taking Active   Blood Glucose Monitoring Suppl (ACCU-CHEK GUIDE ME) w/Device KIT [id number] No Use to check blood sugar once daily. E11.69 [name], MD Taking Active    carvedilol (COREG) 3.125 MG tablet [id number] No Take 1 tablet (3.125 mg total) by mouth 2 (two) times daily with a meal. [name], MD Taking Active   DUREZOL 0.05 % EMUL [id number] No Place 1 drop into the right eye 2 (two) times daily. [provider] Taking Active            Med Note ([name], [name]   Thu Oct 27, 2020 12:54 PM) Once daily  ferrous sulfate 325 (65 FE) MG tablet [id number] No Take 1 tablet (325 mg total) by mouth daily with breakfast. [name], MD Taking Active            Med Note [name], [name]   Thu Oct 27, 2020 12:58 PM) Taking at night  furosemide (LASIX) 20 MG tablet [id number] No Take 1 tablet (20 mg total) by mouth daily. Stop Hydrochlorothiazide [name], MD Taking Active   glimepiride (AMARYL) 4 MG tablet [id number] No Take 1 tablet (4 mg total) by mouth 2 (two) times daily. [name], MD Taking Active   glucose blood (ACCU-CHEK GUIDE) test strip [id number] No Check blood sugars once a day.  Accu-chek Guide Me [name], MD Taking Active   isosorbide mononitrate (IMDUR) 30 MG 24 hr tablet [id number] No Take 1 tablet (30 mg total) by mouth daily. [name], MD Taking Active   latanoprost (XALATAN) 0.005 % ophthalmic solution [id number] No 1 drop at bedtime. [provider] Taking Active   lisinopril (ZESTRIL) 30 MG tablet [id number] No Take 1 tablet (30 mg total) by mouth daily.  [name], MD Taking Active   metFORMIN (GLUCOPHAGE) 1000 MG tablet [id number] No Take 1 tablet (1,000 mg total) by mouth 2 (two) times daily with a meal. [name], MD Taking Active   Multiple Vitamins-Minerals (MULTIVITAMIN WITH MINERALS) tablet [id number] No Take 1 tablet by mouth daily. [provider] Taking Active   rosuvastatin (CRESTOR) 10 MG tablet [id number] No Take 1 tablet (10 mg total) by mouth daily. [name], MD Taking Active   timolol (TIMOPTIC) 0.5 % ophthalmic solution [id number]  No 1 drop every morning. 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, associated with type 2 diabetes mellitus ([place]) 05/22/2018   Nuclear sclerotic cataract of both eyes 02/11/2018   Retinal edema 02/11/2018   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   Follow up:  Patient agrees to Care Plan and Follow-up.  Plan: The Managed Medicaid care management team will reach out to the patient again over the next 30 days.  Date/time of next scheduled Social Work care management/care coordination outreach:  02/16/21  Mickel Fuchs, Arita Miss, [place] Medicaid Team  [phone]

## 2021-02-06 ENCOUNTER — Other Ambulatory Visit: Payer: Self-pay | Admitting: *Deleted

## 2021-02-06 NOTE — Patient Instructions (Signed)
Visit Information  Ms. Markesha Arnott  - as a part of your Medicaid benefit, you are eligible for care management and care coordination services at no cost or copay. I was unable to reach you by phone today but would be happy to help you with your health related needs. Please feel free to call me @  [phone].   A member of the Managed Medicaid care management team will reach out to you again over the next 14 days.   Darrly Loberg RN, BSN [place]  [facility] RN Care Coordinator   

## 2021-02-06 NOTE — Patient Outreach (Signed)
Care Coordination  02/06/2021  [name] 1960/06/02 [id number]   Medicaid Managed Care   Unsuccessful Outreach Note  02/06/2021 Name: Lisa Olson MRN: [id number] DOB: 1960-08-27  Referred by: Marcine Matar, MD Reason for referral : High Risk Managed Medicaid (Unsuccessful RNCM follow up telephone outreach)   An unsuccessful telephone outreach was attempted today. The patient was referred to the case management team for assistance with care management and care coordination.   Follow Up Plan: The care management team will reach out to the patient again over the next 14 days.   Estanislado Emms RN, BSN [place]  [facility]

## 2021-02-07 ENCOUNTER — Ambulatory Visit: Payer: Medicaid Other | Admitting: Internal Medicine

## 2021-02-14 ENCOUNTER — Other Ambulatory Visit: Payer: Self-pay

## 2021-02-14 ENCOUNTER — Other Ambulatory Visit: Payer: Self-pay | Admitting: *Deleted

## 2021-02-14 NOTE — Patient Instructions (Signed)
Visit Information  Ms. Auzenne was given information about Medicaid Managed Care team care coordination services as a part of their [place] Medicaid benefit. [name] verbally consented to engagement with the [facility] Managed Care team.   If you are experiencing a medical emergency, please call 911 or report to your local emergency department or urgent care.   If you have a non-emergency medical problem during routine business hours, please contact your provider's office and ask to speak with a nurse.   For questions related to your [facility], please call: [phone] or visit the homepage here: [email or web address]  If you would like to schedule transportation through your [facility], please call the following number at least 2 days in advance of your appointment: [phone].   Call the [place] at [phone], at any time, 24 hours a day, 7 days a week. If you are in danger or need immediate medical attention call 911.  If you would like help to quit smoking, call 1-800-QUIT-NOW 220-108-6322) OR Espaol: 1-855-Djelo-Ya (2-671-245-8099) o para ms informacin haga clic aqu or Text READY to 200-400 to register via text  Ms. Hassell Done - following are the goals we discussed in your visit today:   Goals Addressed   None     Please see education materials related to HTN and CAD provided as print materials.   The patient verbalized understanding of instructions provided today and agreed to receive a mailed copy of patient instruction and/or educational materials.  Telephone follow up appointment with Managed Medicaid care management team member scheduled for:04/12/21 @ 1:15pm  Lurena Joiner RN, BSN [place] RN Care Coordinator   Following is a copy of your plan of care:  Care Plan : RN Care Manager Plan of Care   Updates made by Melissa Montane, RN since 02/14/2021 12:00 AM     Problem: Disease management needs related to DMII, HTN and CAD      Long-Range Goal: Development of Plan of Care for needs related to DMII, HTN and CAD   Start Date: 10/27/2020  Expected End Date: 04/12/2021  Priority: High  Note:   Current Barriers:  Chronic Disease Management support and education needs related to CAD, HTN, and DMII-Ms. Hoback manages her health with the assistance of her family. She is legally blind. She utilizes Medicaid transportation for medical appointments and SCAT for other errands.   Film/video editor.   RNCM Clinical Goal(s):  Patient will verbalize understanding of plan for management of CAD, HTN, and DMII work with community resource care guide to address needs related to Financial constraints related to housing and food insecurities    through collaboration with Consulting civil engineer, provider, and care team.  Work with MM Pharmacist for medication management-Met  Interventions: Inter-disciplinary care team collaboration (see longitudinal plan of care) Evaluation of current treatment plan related to  self management and patient's adherence to plan as established by provider Provided therapeutic listening and encouragement to patient for improving her health   CAD  (Status: Goal on track: YES.) Assessed understanding of CAD diagnosis Medications reviewed including medications utilized in CAD treatment plan Counseled on importance of regular laboratory monitoring as prescribed; Reviewed Importance of taking all medications as prescribed Reviewed Importance of attending all scheduled provider appointments Assessed social determinant of health barriers;   Diabetes:  (Status: Goal on track: YES.) Lab Results  Component Value Date   HGBA1C 7.0 10/28/2020  Assessed patient's understanding of A1c goal: <7% Reviewed medications with patient and discussed importance of medication adherence;         Discussed plans with patient for ongoing care management follow up and provided patient with direct contact information for care management team;      Reviewed scheduled/upcoming provider appointments including: calling to reschedule PCP appointment and TFC on 04/11/21             Review of patient status, including review of consultants reports, relevant laboratory and other test results, and medications completed;         Hypertension: (Status: Goal on track: YES.) Last practice recorded BP readings:  BP Readings from Last 3 Encounters:  12/08/20 134/78  10/28/20 126/77  05/13/20 (!) 142/80  Most recent eGFR/CrCl: No results found for: EGFR  No components found for: CRCL  Evaluation of current treatment plan related to hypertension self management and patient's adherence to plan as established by provider;   Reviewed medications with patient and discussed importance of compliance;  Reviewed scheduled/upcoming provider appointments including: calling to reschedule PCP appointment and TFC on 04/11/21  Assessed social determinant of health barriers;   Patient Goals/Self-Care Activities: Patient will self administer medications as prescribed Patient will attend all scheduled provider appointments Patient will call pharmacy for medication refills Patient will call provider office for new concerns or questions Patient will work with Care Guide for financial and food resources Patient will work with MM Pharmacist for medication management

## 2021-02-14 NOTE — Patient Outreach (Signed)
Medicaid Managed Care   Nurse Care Manager Note  02/14/2021 Name:  Lisa Olson MRN:  [id number] DOB:  02/02/1961  Lisa Olson is an 60 y.o. year old female who is a primary patient of [name], MD.  The [facility] Managed Care Coordination team was consulted for assistance with:    CAD HTN DMII  [name] was given information about Medicaid Managed Care Coordination team services today. [name] Patient agreed to services and verbal consent obtained.  Engaged with patient by telephone for follow up visit in response to provider referral for case management and/or care coordination services.   Assessments/Interventions:  Review of past medical history, allergies, medications, health status, including review of consultants reports, laboratory and other test data, was performed as part of comprehensive evaluation and provision of chronic care management services.  SDOH (Social Determinants of Health) assessments and interventions performed:   Care Plan  No Known Allergies  Medications Reviewed Today     Reviewed by Melissa Montane, RN (Registered Nurse) on 02/14/21 at 1255  Med List Status: <None>   Medication Order Taking? Sig Documenting Provider Last Dose Status Informant  Accu-Chek FastClix Lancets MISC [id number] Yes Use to check blood sugar once daily. E11.69 [name], MD Taking Active   Ascorbic Acid (VITAMIN C ER PO) [id number] Yes Take by mouth. [provider] Taking Active   Blood Glucose Monitoring Suppl (ACCU-CHEK GUIDE ME) w/Device KIT [id number] Yes Use to check blood sugar once daily. E11.69 [name], MD Taking Active   carvedilol (COREG) 3.125 MG tablet [id number] Yes Take 1 tablet (3.125 mg total) by mouth 2 (two) times daily with a meal. [name], MD Taking Active   DUREZOL 0.05 % EMUL [id number] Yes Place 1 drop into the right eye 2 (two) times daily. [provider] Taking Active            Med Note ([name] A   Thu  Oct 27, 2020 12:54 PM) Once daily  ferrous sulfate 325 (65 FE) MG tablet [id number] Yes Take 1 tablet (325 mg total) by mouth daily with breakfast. [name], MD Taking Active            Med Note [name], [name] A   Thu Oct 27, 2020 12:58 PM) Taking at night  furosemide (LASIX) 20 MG tablet [id number] Yes Take 1 tablet (20 mg total) by mouth daily. Stop Hydrochlorothiazide [name], MD Taking Active   glimepiride (AMARYL) 4 MG tablet [id number] Yes Take 1 tablet (4 mg total) by mouth 2 (two) times daily. [name], MD Taking Active   glucose blood (ACCU-CHEK GUIDE) test strip [id number] Yes Check blood sugars once a day.  Accu-chek Guide Me [name], MD Taking Active   isosorbide mononitrate (IMDUR) 30 MG 24 hr tablet [id number] Yes Take 1 tablet (30 mg total) by mouth daily. [name], MD Taking Active   latanoprost (XALATAN) 0.005 % ophthalmic solution [id number] Yes 1 drop at bedtime. [provider] Taking Active   lisinopril (ZESTRIL) 30 MG tablet [id number] Yes Take 1 tablet (30 mg total) by mouth daily. [name], MD Taking Active   metFORMIN (GLUCOPHAGE) 1000 MG tablet [id number] Yes Take 1 tablet (1,000 mg total) by mouth 2 (two) times daily with a meal. [name], MD Taking Active   Multiple Vitamins-Minerals (MULTIVITAMIN WITH MINERALS) tablet [id number] Yes Take 1 tablet by mouth daily. [provider] Taking Active   rosuvastatin (  CRESTOR) 10 MG tablet [id number] Yes Take 1 tablet (10 mg total) by mouth daily. [name], MD Taking Active   timolol (TIMOPTIC) 0.5 % ophthalmic solution [id number] Yes 1 drop every morning. 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, associated with type 2 diabetes mellitus ([place]) 05/22/2018   Nuclear sclerotic cataract of both eyes 02/11/2018   Retinal edema 02/11/2018   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 02/11/2018   Vitreous hemorrhage, right eye ([place]) 02/11/2018   Legal blindness 07/25/2017   Type 2 diabetes mellitus with retinopathy of both eyes, with long-term current use of insulin ([name]) 02/14/2016   Essential hypertension 02/14/2016   Disorder of skin or subcutaneous tissue 04/24/2013   Onychomycosis due to dermatophyte 03/25/2013   Hallux valgus, acquired 03/25/2013   Ulcer of other part of foot 03/25/2013    Conditions to be addressed/monitored per PCP order:  CAD, HTN, and DMII  Care Plan : RN Care Manager Plan of Care  Updates made by Melissa Montane, RN since 02/14/2021 12:00 AM     Problem: Disease management needs related to DMII, HTN and CAD      Long-Range Goal: Development of Plan of Care for needs related to DMII, HTN and CAD   Start Date: 10/27/2020  Expected End Date: 04/12/2021  Priority: High  Note:   Current Barriers:  Chronic Disease Management support and education needs related to CAD, HTN, and DMII-Ms. Sulton manages her health with the assistance of her family. She is legally blind. She utilizes Medicaid transportation for medical appointments and [employer] for other errands.   Film/video editor.   RNCM Clinical Goal(s):  Patient will verbalize understanding of plan for management of CAD, HTN, and DMII work with community resource care guide to address needs related to  Financial constraints related to housing and food insecurities    through collaboration with Consulting civil engineer, provider, and care team.  Work with MM Pharmacist for medication management-Met  Interventions: Inter-disciplinary care team collaboration (see longitudinal plan of care) Evaluation of current treatment plan related to  self management and patient's adherence to plan as established by provider Provided therapeutic listening and encouragement to patient for improving her health   CAD  (Status: Goal on track: YES.) Assessed understanding of CAD diagnosis Medications reviewed including medications utilized in CAD treatment plan Counseled on importance of regular laboratory monitoring as prescribed; Reviewed Importance of taking all medications as prescribed Reviewed Importance of attending all scheduled provider appointments Assessed social determinant of health barriers;   Diabetes:  (Status: Goal on track: YES.) Lab Results  Component Value Date   HGBA1C 7.0 10/28/2020  Assessed patient's understanding of A1c goal: <7% Reviewed medications with patient and discussed importance of medication adherence;        Discussed plans with  patient for ongoing care management follow up and provided patient with direct contact information for care management team;      Reviewed scheduled/upcoming provider appointments including: calling to reschedule PCP appointment and TFC on 04/11/21             Review of patient status, including review of consultants reports, relevant laboratory and other test results, and medications completed;         Hypertension: (Status: Goal on track: YES.) Last practice recorded BP readings:  BP Readings from Last 3 Encounters:  12/08/20 134/78  10/28/20 126/77  05/13/20 (!) 142/80  Most recent eGFR/CrCl: No results found for: EGFR  No components found for: CRCL  Evaluation of current treatment plan related to hypertension self management and patient's  adherence to plan as established by provider;   Reviewed medications with patient and discussed importance of compliance;  Reviewed scheduled/upcoming provider appointments including: calling to reschedule PCP appointment and TFC on 04/11/21  Assessed social determinant of health barriers;   Patient Goals/Self-Care Activities: Patient will self administer medications as prescribed Patient will attend all scheduled provider appointments Patient will call pharmacy for medication refills Patient will call provider office for new concerns or questions Patient will work with Care Guide for financial and food resources Patient will work with MM Pharmacist for medication management       Follow Up:  Patient agrees to Care Plan and Follow-up.  Plan: The Managed Medicaid care management team will reach out to the patient again over the next 60 days.  Date/time of next scheduled RN care management/care coordination outreach:  04/12/21 @ 1:15pm  Lurena Joiner RN, BSN   [employer] manager

## 2021-02-28 ENCOUNTER — Ambulatory Visit: Payer: Medicaid Other

## 2021-04-03 ENCOUNTER — Ambulatory Visit: Payer: Self-pay

## 2021-04-10 ENCOUNTER — Ambulatory Visit: Payer: Medicaid Other | Admitting: Podiatry

## 2021-04-12 ENCOUNTER — Other Ambulatory Visit: Payer: Self-pay

## 2021-04-12 ENCOUNTER — Other Ambulatory Visit: Payer: Self-pay | Admitting: *Deleted

## 2021-04-12 NOTE — Patient Outreach (Signed)
Medicaid Managed Care   Nurse Care Manager Note  04/12/2021 Name:  Lisa Olson MRN:  [id number] DOB:  01/26/1961  Lisa Olson is an 61 y.o. year old female who is Olson primary patient of Lisa Pier, MD.  [facility] Managed Care Coordination team was consulted for assistance with:    CAD HTN DMII  [name] was given information about Medicaid Managed Care Coordination team services today. Lisa Olson Patient agreed to services and verbal consent obtained.  Engaged with patient by telephone for follow up visit in response to provider referral for case management and/or care coordination services.   Assessments/Interventions:  Review of past medical history, allergies, medications, health status, including review of consultants reports, laboratory and other test data, was performed as part of comprehensive evaluation and provision of chronic care management services.  SDOH (Social Determinants of Health) assessments and interventions performed: SDOH Interventions    Flowsheet Row Most Recent Value  SDOH Interventions   Housing Interventions Intervention Not Indicated  Social Connections Interventions Intervention Not Indicated       Care Plan  No Known Allergies  Medications Reviewed Today     Reviewed by Lisa Montane, RN (Registered Nurse) on 04/12/21 at [place] List Status: <None>   Medication Order Taking? Sig Documenting Provider Last Dose Status Informant  Accu-Chek FastClix Lancets MISC [id number] Yes Use to check blood sugar once daily. E11.69 Lisa Pier, MD Taking Active   Ascorbic Acid (VITAMIN C ER PO) [id number] Yes Take by mouth. [provider] Taking Active   Blood Glucose Monitoring Suppl (ACCU-CHEK GUIDE ME) w/Device KIT [id number] Yes Use to check blood sugar once daily. E11.69 Lisa Pier, MD Taking Active   carvedilol (COREG) 3.125 MG tablet [id number] Yes Take 1 tablet (3.125 mg total) by mouth 2 (two) times daily with Olson meal. Lisa Pier, MD Taking Active   DUREZOL 0.05 % EMUL [id number] Yes Place 1 drop into the right eye 2 (two) times daily. [provider] Taking Active            Med Note (Lisa Olson   Thu Oct 27, 2020 12:54 PM) Once daily  ferrous sulfate 325 (65 FE) MG tablet [id number] Yes Take 1 tablet (325 mg total) by mouth daily with breakfast. Lisa Pier, MD Taking Active            Med Note Lisa Olson, Lisa Olson Olson   Thu Oct 27, 2020 12:58 PM) Taking at night  furosemide (LASIX) 20 MG tablet [id number] Yes Take 1 tablet (20 mg total) by mouth daily. Stop Hydrochlorothiazide Lisa Pier, MD Taking Active   glimepiride (AMARYL) 4 MG tablet [id number] Yes Take 1 tablet (4 mg total) by mouth 2 (two) times daily. Lisa Pier, MD Taking Active   glucose blood (ACCU-CHEK GUIDE) test strip [id number] Yes Check blood sugars once Olson day.  Accu-chek Guide Me Lisa Pier, MD Taking Active   isosorbide mononitrate (IMDUR) 30 MG 24 hr tablet [id number] Yes Take 1 tablet (30 mg total) by mouth daily. Lisa Pier, MD Taking Active   latanoprost (XALATAN) 0.005 % ophthalmic solution [id number] Yes 1 drop at bedtime. [provider] Taking Active   lisinopril (ZESTRIL) 30 MG tablet [id number] Yes Take 1 tablet (30 mg total) by mouth daily. Lisa Pier, MD Taking Active   metFORMIN (GLUCOPHAGE) 1000 MG tablet [id number] Yes Take 1 tablet (1,000 mg total) by mouth 2 (two) times daily with Olson  meal. Lisa Pier, MD Taking Active   Multiple Vitamins-Minerals (MULTIVITAMIN WITH MINERALS) tablet [id number] Yes Take 1 tablet by mouth daily. [provider] Taking Active   rosuvastatin (CRESTOR) 10 MG tablet [id number] Yes Take 1 tablet (10 mg total) by mouth daily. Lisa Pier, MD Taking Active   timolol (TIMOPTIC) 0.5 % ophthalmic solution [id number] Yes 1 drop every morning. 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, associated with type 2 diabetes mellitus ([place]) 05/22/2018   Nuclear sclerotic cataract of both eyes 02/11/2018   Retinal edema 02/11/2018   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 02/11/2018   Vitreous hemorrhage, right eye ([place]) 02/11/2018   Legal blindness 07/25/2017   Type 2 diabetes mellitus with retinopathy of both eyes, with long-term current use of insulin ([place]) 02/14/2016   Essential hypertension 02/14/2016   Disorder of skin or subcutaneous tissue 04/24/2013   Onychomycosis due to dermatophyte 03/25/2013   Hallux valgus, acquired 03/25/2013   Ulcer of other part of foot 03/25/2013    Conditions to be addressed/monitored per PCP order:  CAD, HTN, and DMII  Care Plan : RN Care Manager Plan of Care  Updates made by Lisa Montane, RN since 04/12/2021 12:00 AM     Problem: Disease management needs related to DMII, HTN and CAD      Long-Range Goal: Development of Plan of Care for needs related to DMII, HTN and CAD   Start Date: 10/27/2020  Expected End Date: 04/12/2021  Priority: High  Note:   Current Barriers:  Chronic Disease Management support and education needs related to CAD, HTN, and DMII-[name] has not been able to reschedule her missed PCP appointment. She is requesting Olson copy of list of resources provided by Care Guide.  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   RNCM Clinical Goal(s):  Patient will verbalize understanding of plan for management of CAD, HTN, and DMII work with community resource care guide to address needs related to Financial constraints related to housing and food insecurities    through collaboration with Consulting civil engineer, provider, and care team.  Work with MM Pharmacist for medication management-Met  Interventions: Inter-disciplinary care team collaboration (see longitudinal plan of care) Evaluation of current treatment plan related to  self management and patient's adherence to plan as established by provider Provided list of resources from [place] in AVS Advised patient to reschedule missed PCP and TFC appointments   CAD  (Status: Goal on track: YES.) Assessed understanding of CAD diagnosis Medications reviewed including medications utilized in CAD treatment plan Counseled on importance of regular laboratory monitoring as prescribed; Reviewed Importance of taking all medications as prescribed Reviewed Importance of attending all scheduled provider appointments Assessed social determinant of health barriers;   Diabetes:  (Status:  Goal on track: YES.) Lab Results  Component Value Date   HGBA1C 7.0 10/28/2020  Assessed patient's understanding of A1c goal: <7% Reviewed medications with patient and discussed importance of medication adherence;        Discussed plans with patient for ongoing care management follow up and provided patient with direct contact information for care management team;      Reviewed scheduled/upcoming provider appointments including: calling to reschedule PCP and TFC appointments            Review of patient status, including review of consultants reports, relevant laboratory and other test results, and medications completed;         Hypertension: (Status: Goal on track: YES.) Last practice recorded BP readings:  BP Readings from Last 3 Encounters:   12/08/20 134/78  10/28/20 126/77  05/13/20 (!) 142/80  Most recent eGFR/CrCl: No results found for: EGFR  No components found for: CRCL  Evaluation of current treatment plan related to hypertension self management and patient's adherence to plan as established by provider;   Reviewed medications with patient and discussed importance of compliance;  Reviewed scheduled/upcoming provider appointments including: calling to reschedule PCP appointment and TFC on 04/11/21  Assessed social determinant of health barriers;   Patient Goals/Self-Care Activities: Patient will self administer medications as prescribed Patient will attend all scheduled provider appointments Patient will call pharmacy for medication refills Patient will call provider office for new concerns or questions Patient will work with Care Guide for financial and food resources Patient will work with MM Pharmacist for medication management       Follow Up:  Patient agrees to Care Plan and Follow-up.  Plan: The Managed Medicaid care management team will reach out to the patient again over the next 60 days.  Date/time of next scheduled RN care management/care coordination outreach:  06/14/21 @ 1:15pm  Lurena Joiner RN, [place] RN Care Coordinator

## 2021-04-12 NOTE — Patient Instructions (Signed)
Visit Information  Ms. Doner was given information about Medicaid Managed Care team care coordination services as a part of their [place] Medicaid benefit. [name] verbally consented to engagement with the [facility] Managed Care team.   If you are experiencing a medical emergency, please call 911 or report to your local emergency department or urgent care.   If you have a non-emergency medical problem during routine business hours, please contact your provider's office and ask to speak with a nurse.   For questions related to your [facility], please call: [phone] or visit the homepage here: [email or web address]  If you would like to schedule transportation through your [facility], please call the following number at least 2 days in advance of your appointment: [phone].   Call the [place] at [phone], at any time, 24 hours a day, 7 days a week. If you are in danger or need immediate medical attention call 911.  If you would like help to quit smoking, call 1-800-QUIT-NOW 785-507-8106) OR Espaol: 1-855-Djelo-Ya (6-734-193-7902) o para ms informacin haga clic aqu or Text READY to 200-400 to register via text  Ms. Lefever,   Please see education materials related to CAD provided as print materials.   The patient verbalized understanding of instructions provided today and agreed to receive a mailed copy of patient instruction and/or educational materials.  Telephone follow up appointment with Managed Medicaid care management team member scheduled for:06/14/21 @ 1:15pm  Lurena Joiner RN, BSN [place] RN Care Coordinator   Following is a copy of your plan of care:  Care Plan : RN Care Manager Plan of Care  Updates made by Melissa Montane, RN since 04/12/2021 12:00 AM     Problem: Disease management  needs related to DMII, HTN and CAD      Long-Range Goal: Development of Plan of Care for needs related to DMII, HTN and CAD   Start Date: 10/27/2020  Expected End Date: 04/12/2021  Priority: High  Note:   Current Barriers:  Chronic Disease Management support and education needs related to CAD, HTN, and DMII-Ms. Lampton has not been able to reschedule her missed PCP appointment. She is requesting a copy of list of resources provided by Care Guide.  Ms. 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   RNCM Clinical Goal(s):  Patient will verbalize understanding of plan for management of CAD, HTN, and DMII work with community resource care guide to address needs related to Financial constraints related to housing and food insecurities    through collaboration with Consulting civil engineer, provider, and care team.  Work with MM Pharmacist for medication management-Met  Interventions: Inter-disciplinary care team collaboration (see longitudinal plan of care) Evaluation of current treatment plan related to  self management and patient's adherence to plan as established by provider Provided list of resources from [place] in AVS Advised patient to reschedule missed PCP and TFC appointments   CAD  (Status: Goal on track: YES.) Assessed understanding of CAD diagnosis Medications reviewed including medications utilized in CAD treatment plan Counseled on importance of regular laboratory monitoring as prescribed; Reviewed Importance of taking all medications as prescribed Reviewed Importance of attending all scheduled provider appointments Assessed social determinant of health barriers;   Diabetes:  (Status: Goal on track: YES.) Lab Results  Component Value Date   HGBA1C 7.0 10/28/2020  Assessed patient's understanding of A1c goal: <7% Reviewed medications  with patient and discussed importance of medication adherence;        Discussed plans with patient for ongoing care management  follow up and provided patient with direct contact information for care management team;      Reviewed scheduled/upcoming provider appointments including: calling to reschedule PCP and TFC appointments            Review of patient status, including review of consultants reports, relevant laboratory and other test results, and medications completed;         Hypertension: (Status: Goal on track: YES.) Last practice recorded BP readings:  BP Readings from Last 3 Encounters:  12/08/20 134/78  10/28/20 126/77  05/13/20 (!) 142/80  Most recent eGFR/CrCl: No results found for: EGFR  No components found for: CRCL  Evaluation of current treatment plan related to hypertension self management and patient's adherence to plan as established by provider;   Reviewed medications with patient and discussed importance of compliance;  Reviewed scheduled/upcoming provider appointments including: calling to reschedule PCP appointment and TFC on 04/11/21  Assessed social determinant of health barriers;   Patient Goals/Self-Care Activities: Patient will self administer medications as prescribed Patient will attend all scheduled provider appointments Patient will call pharmacy for medication refills Patient will call provider office for new concerns or questions Patient will work with Care Guide for financial and food resources Patient will work with MM Pharmacist for medication management      Local Food Banks:  [facility] [address] [place], [place], [address] [phone] M - Su: Breakfast @ 7-7:45a, Dinner (Sun. Only) 5:30-6:00 p  [employer] [facility] [employer] [address] [place], [place], [place], [address] [phone] Breakfast: M - Su 8a - 9a; Lunch: M - Sa 11a - 12p; Dinner: M - Su 6p - 7p  [employer] [phone] E. [address] [place], [place], [address] [phone] 3rd Sat of month: 11a-1p  [employer] [place],  [place], [place], [address] [phone] Sa: [place] (1st Saturday of each month)  [place] [address] [place], [place], [address] [phone] W: 6p - 7p  [facility] [address] [place], [place], [address] [phone] M - Th: [place] [address], [place], [place], [address] [phone] Tu: [place] (2nd and 4th Tuesday of each month)  [employer] [address] [place], [place], [address] [phone] by appointment [place] [facility] [place], [place], [place], [address] [phone] Tu & Th: [place] [address] [place], [place], [address] [phone] 1st Monday of each month: [place] [address], [place], [place], [address] [phone] M, W, Th & F: 9a - 12p & 1p - 4p; T: 9a - 12p, 1p - 4p, [place] [address] [place], [place], [address] [phone] M - F: 9a - 12p  [employer] [address] [place], [place], [address] [phone] Joya Salm; Sat [address] of [place], [place], [place], [address] [phone] Sa: 10:00a - 12p  [facility] [address] [place], [place], [address] [phone] Jerilynn Mages, F: [facility] [place] [address], [place], [place], [address] [phone] M: [place] (2nd and 4th Monday of each month)  [facility] [address], [place], [place], [address] [phone] 3rd Saturday of each month: 11a - 1p  [place] [facility] [place], [place], [place], [address] [phone] M: 3:30p - 6:30p; Tu & F: 9a - 12p; W & Th: 1:30p - 4:30p  [facility] [address] [place], [place], [address] [phone] Aleene Davidson Th: 11a-4p  [employer]  [place], [place], [place], [address] [phone] Tu & F: 11a - 12:30p  [facility] [address] [place], [place], [address] [phone] Tu & Th: 8:30a - 9:30a (Breakfast); M - F: 11:30a - 12:30p (Lunch)  [address] [place], [place], [address] [phone] Tu & Th: 6p - 7:30p; W: 10a - 11:45a  [facility] [address] [place], [place], [address] [phone] Flossie Buffy & Th: 9a - 12p; W: [place] [facility] [address] [place], [place], [address] [phone] M - F: 11a - 12p (Lunch); Su: 4p - 4:45p ([place])  [facility] [address], [place], [place], [address] [phone] Tu : 10a - 12p; W: 10a - 12p; ThAlroy Dust - 12p  [address] [place], [place], [address] [phone] M - F: 9a - 11:30a                       Financial Resources:  [facility] [address] [place], [place], [place], [address] [phone] Nancy Fetter. 12:30 p - 1:30p  [facility] [place], [place], [place], [address] [phone] 2nd & 4th Th of each month: 1p - 3p  [place] [employer] of Building services engineer (heating/cooling and water assistance) [phone]  [place] [employer] [place] [phone]

## 2021-05-26 ENCOUNTER — Encounter (INDEPENDENT_AMBULATORY_CARE_PROVIDER_SITE_OTHER): Payer: Medicaid Other | Admitting: Ophthalmology

## 2021-06-06 ENCOUNTER — Ambulatory Visit (INDEPENDENT_AMBULATORY_CARE_PROVIDER_SITE_OTHER): Payer: Medicaid Other | Admitting: Podiatry

## 2021-06-06 ENCOUNTER — Other Ambulatory Visit: Payer: Self-pay

## 2021-06-06 ENCOUNTER — Encounter: Payer: Self-pay | Admitting: Podiatry

## 2021-06-06 DIAGNOSIS — M79675 Pain in left toe(s): Secondary | ICD-10-CM | POA: Diagnosis not present

## 2021-06-06 DIAGNOSIS — M2010 Hallux valgus (acquired), unspecified foot: Secondary | ICD-10-CM | POA: Diagnosis not present

## 2021-06-06 DIAGNOSIS — B351 Tinea unguium: Secondary | ICD-10-CM | POA: Diagnosis not present

## 2021-06-06 DIAGNOSIS — E119 Type 2 diabetes mellitus without complications: Secondary | ICD-10-CM | POA: Diagnosis not present

## 2021-06-06 DIAGNOSIS — M79674 Pain in right toe(s): Secondary | ICD-10-CM | POA: Diagnosis not present

## 2021-06-06 NOTE — Progress Notes (Signed)
This patient returns to my office for at risk foot care.  This patient requires this care by a professional since this patient will be at risk due to having type 2 diabetes.    This patient is unable to cut nails herself since the patient cannot reach her nails.These nails are painful walking and wearing shoes.  This patient presents for at risk foot care today.  General Appearance  Alert, conversant and in no acute stress.  Vascular  Dorsalis pedis and posterior tibial  pulses are  Weakly palpable  bilaterally.  Capillary return is within normal limits  bilaterally. Temperature is within normal limits  bilaterally.  Neurologic  Senn-Weinstein monofilament wire test within normal limits  bilaterally. Muscle power within normal limits bilaterally.  Nails Thick disfigured discolored nails with subungual debris  from hallux to fifth toes bilaterally. No evidence of bacterial infection or drainage bilaterally.  Orthopedic  No limitations of motion  feet .  No crepitus or effusions noted.  No bony pathology or digital deformities noted. Mild  HAV  B/L.  Skin  normotropic skin with no porokeratosis noted bilaterally.  No signs of infections or ulcers noted.     Onychomycosis  Pain in right toes  Pain in left toes  Consent was obtained for treatment procedures.   Mechanical debridement of nails 1-5  bilaterally performed with a nail nipper.  Filed with dremel without incident.    Return office visit  3 months                    Told patient to return for periodic foot care and evaluation due to potential at risk complications.   Nathanie Ottley DPM  

## 2021-06-08 NOTE — Progress Notes (Signed)
?Cardiology Office Note:   ? ?Date:  06/12/2021  ? ?ID:  Lisa Olson, DOB [birth year], MRN [id number] ? ?PCP:  [name], MD  ?Cardiologist:  Buford Dresser, MD ? ?Referring MD: [name], MD  ? ?CC: follow up. ? ?History of Present Illness:   ? ?Lisa Olson is a 61 y.o. 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 I initially saw her 06/03/19 as a new consult at the request of [name], MD for the evaluation and management of chest pain. ? ?Today: ?Overall, she is feeling well. She denies any chest pains. ? ?Lately she has been trying to exercise routinely. She goes for walks, which includes going up and downhill.  Generally her breathing is okay. ? ?She is wearing her compression socks. She denies any recent significant issues with LE edema. ? ?She states her blood sugars have been excellent.  ? ?No smoking or alcohol consumption. ? ?She denies any palpitations. No lightheadedness, headaches, syncope, orthopnea, or PND. ? ? ?Past Medical History:  ?Diagnosis Date  ? Diabetes mellitus without complication ([place])   ? Hypertension   ? Legally blind   ? 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 ? ?Past Surgical History:  ?Procedure Laterality Date  ? EYE SURGERY    ? LASIK    ? RETINAL DETACHMENT SURGERY    ? ? ?Current Medications: ?Current Outpatient Medications on File Prior to Visit  ?Medication Sig  ? Ascorbic Acid (VITAMIN C ER PO) Take by mouth.  ? carvedilol (COREG) 3.125 MG tablet Take 1 tablet (3.125 mg total) by mouth 2 (two) times daily with a meal.  ? DUREZOL 0.05 % EMUL Place 1 drop into the right eye 2 (two) times daily.  ? ferrous sulfate 325 (65 FE) MG tablet Take 1 tablet (325 mg total) by mouth daily with breakfast.  ? furosemide (LASIX) 20 MG tablet Take 1 tablet (20 mg total) by mouth daily. Stop Hydrochlorothiazide  ? glimepiride (AMARYL) 4 MG tablet Take 1 tablet (4 mg total) by  mouth 2 (two) times daily.  ? isosorbide mononitrate (IMDUR) 30 MG 24 hr tablet Take 1 tablet (30 mg total) by mouth daily.  ? latanoprost (XALATAN) 0.005 % ophthalmic solution 1 drop at bedtime.  ? lisinopril (ZESTRIL) 30 MG tablet Take 1 tablet (30 mg total) by mouth daily.  ? metFORMIN (GLUCOPHAGE) 1000 MG tablet Take 1 tablet (1,000 mg total) by mouth 2 (two) times daily with a meal.  ? Multiple Vitamins-Minerals (MULTIVITAMIN WITH MINERALS) tablet Take 1 tablet by mouth daily.  ? rosuvastatin (CRESTOR) 10 MG tablet Take 1 tablet (10 mg total) by mouth daily.  ? timolol (TIMOPTIC) 0.5 % ophthalmic solution 1 drop every morning.  ? Accu-Chek FastClix Lancets MISC Use to check blood sugar once daily. E11.69  ? Blood Glucose Monitoring Suppl (ACCU-CHEK GUIDE ME) w/Device KIT Use to check blood sugar once daily. E11.69  ? glucose blood (ACCU-CHEK GUIDE) test strip Check blood sugars once a day.  Accu-chek Guide Me  ? ?No current facility-administered medications on file prior to visit.  ?  ? ?Allergies:   Patient has no known allergies.  ? ?Social History  ? ?Tobacco Use  ? Smoking status: Never  ? Smokeless tobacco: Never  ?Vaping Use  ? Vaping Use: Never used  ?Substance Use Topics  ? Drug use: No  ? ? ?Family History: ?family history includes  Diabetes in her father and mother; Hypertension in her sister. ? ?ROS:   ?Please see the history of present illness.   ?Additional pertinent ROS unremarkable. ? ? ?EKGs/Labs/Other Studies Reviewed:   ? ?The following studies were reviewed today: ? ?Nuclear stress 06/17/19 ?Nuclear stress EF: 70%. ?The left ventricular ejection fraction is hyperdynamic (>65%). ?There was no ST segment deviation noted during stress. ?Findings consistent with ischemia. ?This is an intermediate risk study. ?  ?Moderate, reversible, medium size defect in the apical septum and anteroseptum, and the mid anterior wall, that returns to normal at rest. Findings consistent with ischemia.  ? ?Echo  09/04/18 ?1. The left ventricle has normal systolic function with an ejection  ?fraction of 60-65%. The cavity size was normal. There is mildly increased  ?left ventricular wall thickness. Left ventricular diastolic Doppler  ?parameters are consistent with impaired  ?relaxation. Indeterminate filling pressures The E/e' is 8-15.  ? 2. The right ventricle has normal systolic function. The cavity was  ?normal. There is no increase in right ventricular wall thickness.  ? 3. The mitral valve is abnormal. Mild thickening of the mitral valve  ?leaflet. There is mild mitral annular calcification present. No evidence  ?of mitral valve stenosis.  ? 4. The tricuspid valve is grossly normal.  ? 5. The aortic valve is abnormal. Mild sclerosis of the aortic valve. No  ?stenosis of the aortic valve.  ? 6. The aortic root and ascending aorta are normal in size and structure.  ? ?SUMMARY  ?   ?Technically difficult study. Reduced echo windows. LVEF 60-65%, mild  ?LVH, normal wall motion, grade 1 DD, indeterminate LV filling  ?pressure, normal biatrial size, trivial MR, normal IVC, no  ?pericardial effusion.  ? ?EKG:  EKG is personally reviewed.   ?06/12/2021: not ordered today ?12/08/2020: NSR at 83 bpm ?06/03/19: NSR, nonspecific inferolateral t wave flattening/inversion ? ?Recent Labs: ?10/28/2020: TSH 1.220  ? ?Recent Lipid Panel ?   ?Component Value Date/Time  ? CHOL 182 10/28/2020 0939  ? TRIG 182 (H) 10/28/2020 0939  ? HDL 43 10/28/2020 0939  ? CHOLHDL 4.2 10/28/2020 0939  ? CHOLHDL 4.7 05/14/2016 1607  ? VLDL 44 (H) 05/14/2016 1607  ? LDLCALC 107 (H) 10/28/2020 6962  ? ? ?Physical Exam:   ? ?VS:  BP 124/86 (BP Location: Left Arm, Patient Position: Sitting, Cuff Size: Large)   Pulse 74   Ht 5' 7"  (1.702 m)   Wt 229 lb 4.8 oz (104 kg)   SpO2 95%   BMI 35.91 kg/m?    ? ?Wt Readings from Last 3 Encounters:  ?06/12/21 229 lb 4.8 oz (104 kg)  ?12/08/20 230 lb (104.3 kg)  ?10/28/20 226 lb (102.5 kg)  ?  ?GEN: Well nourished, well  developed in no acute distress ?HEENT: Normal, moist mucous membranes ?NECK: No JVD ?CARDIAC: regular rhythm, normal S1 and S2, no rubs or gallops. No murmur. ?VASCULAR: Radial and DP pulses 2+ bilaterally. No carotid bruits ?RESPIRATORY:  Clear to auscultation without rales, wheezing or rhonchi  ?ABDOMEN: Soft, non-tender, non-distended ?MUSCULOSKELETAL:  Ambulates independently ?SKIN: Warm and dry, no edema ?NEUROLOGIC:  Alert and oriented x 3. Legally blind ?PSYCHIATRIC:  Normal affect  ? ?ASSESSMENT:   ? ?1. Essential hypertension   ?2. Coronary artery disease involving native coronary artery of native heart without angina pectoris   ?3. Mixed hyperlipidemia   ?4. Type 2 diabetes mellitus with complication, without long-term current use of insulin ([place])   ?5. Counseling on health promotion  and disease prevention   ? ? ?PLAN:   ? ?Nonspecific chest pain, dyspnea on exertion: now resolved ?-Lexiscan with moderate reversible ischemia, consistent with CAD ?-discussed options, elected medical management and doing well with this. ?-tolerating imdur, no further chest discomfort ?-tolerating rosuvastatin ?-no aspirin given prior retinal hemorrhages ?  ?Mixed hyperlipidemia:  ?-goal LDL <70 given abnormal stress test, have been above goal ?-tolerating change to rosuvastatin, but not taking routinely. She takes for one week/skips for a week. Discussed other ways to try to take rosuvastatin that will lead to more consistency ?  ?Hypertension: goal <130/80. Near goal today ?-home BP cuff matches office, checked previously ?-reports home numbers are well controlled.  ?-continue carvedilol 3.125 mg BID, lisinopril 30 mg daily, imdur 30 mg daily ?-on furosemide 20 mg daily ?  ?Type II diabetes: A1c 7.0 per [facility], on metformin and glimeperide. Not on insulin ?-complicated by neuropathy and retinopathy, legally blind ?-consider SGLT2i/GLP1RA given presumed CAD. Does not wish to change at this time, continue to address ?  ?Cardiac  risk counseling and prevention recommendations: ?-recommend heart healthy/Mediterranean diet, with whole grains, fruits, vegetable, fish, lean meats, nuts, and olive oil. Limit salt. ?-limited exercise ability ?-recom

## 2021-06-12 ENCOUNTER — Ambulatory Visit (INDEPENDENT_AMBULATORY_CARE_PROVIDER_SITE_OTHER): Payer: Medicaid Other | Admitting: Cardiology

## 2021-06-12 ENCOUNTER — Other Ambulatory Visit: Payer: Self-pay

## 2021-06-12 ENCOUNTER — Encounter (HOSPITAL_BASED_OUTPATIENT_CLINIC_OR_DEPARTMENT_OTHER): Payer: Self-pay | Admitting: Cardiology

## 2021-06-12 VITALS — BP 124/86 | HR 74 | Ht 67.0 in | Wt 229.3 lb

## 2021-06-12 DIAGNOSIS — Z7189 Other specified counseling: Secondary | ICD-10-CM | POA: Diagnosis not present

## 2021-06-12 DIAGNOSIS — I2510 Atherosclerotic heart disease of native coronary artery without angina pectoris: Secondary | ICD-10-CM

## 2021-06-12 DIAGNOSIS — I10 Essential (primary) hypertension: Secondary | ICD-10-CM | POA: Diagnosis not present

## 2021-06-12 DIAGNOSIS — E118 Type 2 diabetes mellitus with unspecified complications: Secondary | ICD-10-CM | POA: Diagnosis not present

## 2021-06-12 DIAGNOSIS — E782 Mixed hyperlipidemia: Secondary | ICD-10-CM

## 2021-06-12 NOTE — Patient Instructions (Signed)

## 2021-06-14 ENCOUNTER — Other Ambulatory Visit: Payer: Self-pay | Admitting: *Deleted

## 2021-06-14 NOTE — Patient Instructions (Signed)
Visit Information  [name]  - as a part of your Medicaid benefit, you are eligible for care management and care coordination services at no cost or copay. I was unable to reach you by phone today but would be happy to help you with your health related needs. Please feel free to call me @  [phone].   A member of the Managed Medicaid care management team will reach out to you again over the next 14 days.   Dawson Hollman RN, BSN [place]  [facility] RN Care Coordinator   

## 2021-06-14 NOTE — Patient Outreach (Signed)
Care Coordination ? ?06/14/2021 ? ?[name] ?21-Feb-1961 ?[id number] ? ? ?Medicaid Managed Care  ? ?Unsuccessful Outreach Note ? ?06/14/2021 ?Name: Lisa Olson MRN: [id number] DOB: Sep 06, 1960 ? ?Referred by: Marcine Matar, MD ?Reason for referral : High Risk Managed Medicaid (Unsuccessful RNCM follow up telephone outreach) ? ? ?An unsuccessful telephone outreach was attempted today. The patient was referred to the case management team for assistance with care management and care coordination.  ? ?Follow Up Plan: A HIPAA compliant phone message was left for the patient providing contact information and requesting a return call.  ? ?Estanislado Emms RN, BSN ?[place]  [facility] ?RN Care Coordinator ? ? ?

## 2021-06-19 ENCOUNTER — Other Ambulatory Visit: Payer: Self-pay | Admitting: *Deleted

## 2021-06-19 NOTE — Patient Instructions (Addendum)
Visit Information ? ?Lisa Olson was given information about Medicaid Managed Care team care coordination services as a part of their [place] Medicaid benefit. Lisa Olson verbally consented to engagement with the [facility] Managed Care team.  ? ?If you are experiencing a medical emergency, please call 911 or report to your local emergency department or urgent care.  ? ?If you have a non-emergency medical problem during routine business hours, please contact your provider's office and ask to speak with a nurse.  ? ?For questions related to your [facility], please call: [phone] or visit the homepage here: [email or web address] ? ?If you would like to schedule transportation through your [facility], please call the following number at least 2 days in advance of your appointment: [phone]. ? Rides for urgent appointments can also be made after hours by calling Member Services. ? ?Call the [place] at [phone], at any time, 24 hours a day, 7 days a week. If you are in danger or need immediate medical attention call 911. ? ?If you would like help to quit smoking, call 1-800-QUIT-NOW 650-787-2216) OR Espa?ol: 1-855-D?jelo-Ya (443) [phone]) o para m?s informaci?n haga clic aqu? or Text READY to 200-400 to register via text ? ?Lisa Olson, ? ? ?Please see education materials related to diet provided as print materials.  ? ?The patient verbalized understanding of instructions, educational materials, and care plan provided today and agreed to receive a mailed copy of patient instructions, educational materials, and care plan.  ? ?Telephone follow up appointment with Managed Medicaid care management team member scheduled for:08/07/21 @ 10:30am ? ?Lurena Joiner RN, BSN ?[place] ?RN Care Coordinator ? ? ?Following is a copy of your plan of care:   ?Care Plan : [place] of Care  ?Updates made by Melissa Montane, RN since 06/19/2021 12:00 AM  ?  ? ?Problem: Disease management needs related to DMII, HTN and CAD   ?  ? ?Long-Range Goal: Development of Plan of Care for needs related to DMII, HTN and CAD   ?Start Date: 10/27/2020  ?Expected End Date: 08/07/2021  ?Priority: High  ?Note:   ?Current Barriers:  ?Chronic Disease Management support and education needs related to CAD, HTN, and DMII-Lisa Olson will follow up with PCP 08/03/21. She is working on improving her diet and compliance with taking medications. She needs to find a new Ophthalmologist, [facility] no longer accepts [facility]. She attended follow up with Cardiology, received a good report and will not need to follow up for one year. ?Film/video editor.  ? ?RNCM Clinical Goal(s):  ?Patient will verbalize understanding of plan for management of CAD, HTN, and DMII ?work with community resource care guide to address needs related to Financial constraints related to housing and food insecurities    through collaboration with Consulting civil engineer, provider, and care team.  ?Work with MM Pharmacist for medication management-Met ? ?Interventions: ?Inter-disciplinary care team collaboration (see longitudinal plan of care) ?Evaluation of current treatment plan related to  self management and patient's adherence to plan as established by provider ?Provided patient with Medicaid Enrollment Broker information [phone] ?Advised patient to contact [facility] member services for enhanced member benefits(wellness benefit for vitamins) ? ? ?CAD  (Status: Goal Met.) ?Assessed understanding of CAD diagnosis ?Medications reviewed including medications utilized in CAD treatment plan ?Counseled on importance of regular laboratory monitoring as prescribed; ?Reviewed Importance of taking all medications as prescribed ?Reviewed Importance of  attending all scheduled provider appointments ?Assessed social determinant of health barriers;   ? ?Diabetes:  (Status: Goal on track: YES.) ?Lab Results  ?Component Value Date  ? HGBA1C 7.0 10/28/2020  ?Assessed patient's understanding of A1c goal: <7% ?Reviewed medications with patient and discussed importance of medication adherence;        ?Discussed plans with patient for ongoing care management follow up and provided patient with direct contact information for care management team;      ?Reviewed scheduled/upcoming provider appointments including: PCP 08/03/21            ?Review of patient status, including review of consultants reports, relevant laboratory and other test results, and medications completed;       ?Discussed diet and exercise ?Advised patient to discuss needing Ophthalmology referral with Dr. Wynetta Emery during her visit on 08/03/21 ? ? ?Hypertension: (Status: Goal on track: YES.) ?Last practice recorded BP readings:  ?BP Readings from Last 3 Encounters:  ?06/12/21 124/86  ?12/08/20 134/78  ?10/28/20 126/77  ?Most recent eGFR/CrCl: No results found for: EGFR  No components found for: CRCL ? ?Evaluation of current treatment plan related to hypertension self management and patient's adherence to plan as established by provider;   ?Reviewed medications with patient and discussed importance of compliance;  ?Reviewed scheduled/upcoming provider appointments including: PCP 08/03/21 ?Assessed social determinant of health barriers;  ?Diet and exercise reviewed ? ?Patient Goals/Self-Care Activities: ?Patient will self administer medications as prescribed ?Patient will attend all scheduled provider appointments ?Patient will call pharmacy for medication refills ?Patient will call provider office for new concerns or questions ?Patient will work with [place] for financial and food resources ?Patient will work with MM Pharmacist for medication management ? ? ?  ? Local Food Banks: ? ?[employer] ?[address], [place], [place], [address] ?[phone] ?M - Su: Breakfast @ 7-7:45a, Dinner  (Sun. Only) 5:30-6:00 p ? ?[employer] [facility] [employer] ?[place], [place], [place], [address] ?[phone] ?Breakfast: M - Su 8a - 9a; Lunch: M - Sa 11a - 12p; Dinner: M - Su 6p - 7p ? ?[employer] ?[address] [place], [place], [place], [address] ?[phone] ?3rd Sat of month: 11a-1p ? ?[employer] ?[address] [place], [place], [address] ?[phone] ?Sa: 9a - 11a (1st Saturday of each month) ? ?[facility] ?[address] [place], [place], [address] ?[phone] ?W: 6p - 7p ? ?[employer] ?[address] [place], [place], [address] ?[phone] ?M - Th: 9a - 3:30p ? ?[facility] ?[address], [place], [place], [address] ?[phone] ?Tu: 5p - 7p (2nd and 4th Tuesday of each month) ? ?[employer] ?[address], [place], [place], [address] ?[phone] ?by appointment M - F 10a - 4p ? ?[facility] ?[place], [place], [place], [address] ?[phone] ?Tu & Th: 9:30a - 4:45p ? ?[facility] ?[address], [place], [place], [address] ?[phone] ?1st Monday of each month: 8a - 9a ? ?[facility] ?[address] [place], [place], [address] ?[phone] ?M, W, Th & F: 9a - 12p & 1p - 4p; T: 9a - 12p, 1p - 4p, 5p - 7p ? ?[facility] ?[address] [place] [place], [place], [address] ?[phone] ?M - F: 9a - 12p ? ?[employer] ?[address] [place], [place], [address] ?[phone] ?M - F 10a-6p; Sat 10a-4p ? ?[facility] ?[address] [place], [place], [address] ?[phone] ?Sa: 10:00a - 12p ? ?[employer] [facility] ?[address] [place], [place], [address] ?[phone] ?M, F: 12 p - 3p ? ?[facility] [facility] ?[address], [place], [place], [address] ?[phone] ?M: 9a - 5p (2nd and 4th Monday of each  month) ? ?[employer] ?[address] [place], [place], [address] ?[phone] ?3rd Saturday of each month: 11a - 1p ? ?[facility] ?[address] [place], [place], [address] ?[phone] ?M: 3:30p

## 2021-06-19 NOTE — Patient Outreach (Signed)
?Medicaid Managed Care   ?Nurse Care Manager Note ? ?06/19/2021 ?Name:  Lisa Olson MRN:  [id number] DOB:  10/01/1960 ? ?Lisa Olson is an 61 y.o. year old female who is a primary patient of Johnson, Deborah B, MD.  The Medicaid Managed Care Coordination team was consulted for assistance with:    ?HTN ?DMII ? ?Lisa Olson was given information about Medicaid Managed Care Coordination team services today. Lisa Olson Patient agreed to services and verbal consent obtained. ? ?Engaged with patient by telephone for follow up visit in response to provider referral for case management and/or care coordination services.  ? ?Assessments/Interventions:  Review of past medical history, allergies, medications, health status, including review of consultants reports, laboratory and other test data, was performed as part of comprehensive evaluation and provision of chronic care management services. ? ?SDOH (Social Determinants of Health) assessments and interventions performed: ?SDOH Interventions   ? ?Flowsheet Row Most Recent Value  ?SDOH Interventions   ?Food Insecurity Interventions Patient Refused  ?Transportation Interventions Intervention Not Indicated  ? ?  ? ? ?Care Plan ? ?No Known Allergies ? ?Medications Reviewed Today   ? ? Reviewed by [name], [name], RN (Registered Nurse) on 06/19/21 at 1140  Med List Status: <None>  ? ?Medication Order Taking? Sig Documenting Provider Last Dose Status Informant  ?Accu-Chek FastClix Lancets MISC [id number] Yes Use to check blood sugar once daily. E11.69 Johnson, Deborah B, MD Taking Active   ?Ascorbic Acid (VITAMIN C ER PO) [id number] Yes Take by mouth. [provider] Taking Active   ?Blood Glucose Monitoring Suppl (ACCU-CHEK GUIDE ME) w/Device KIT [id number] Yes Use to check blood sugar once daily. E11.69 Johnson, Deborah B, MD Taking Active   ?carvedilol (COREG) 3.125 MG tablet [id number] Yes Take 1 tablet (3.125 mg total) by mouth 2 (two) times daily with a meal. Johnson, Deborah B, MD  Taking Active   ?DUREZOL 0.05 % EMUL [id number] Yes Place 1 drop into the right eye 2 (two) times daily. [provider] Taking Active   ?         ?Med Note ([name], [name]   Thu Oct 27, 2020 12:54 PM) Once daily  ?ferrous sulfate 325 (65 FE) MG tablet [id number] Yes Take 1 tablet (325 mg total) by mouth daily with breakfast. Johnson, Deborah B, MD Taking Active   ?         ?Med Note ([name], [name]   Thu Oct 27, 2020 12:58 PM) Taking at night  ?furosemide (LASIX) 20 MG tablet [id number] Yes Take 1 tablet (20 mg total) by mouth daily. Stop Hydrochlorothiazide Johnson, Deborah B, MD Taking Active   ?glimepiride (AMARYL) 4 MG tablet [id number] Yes Take 1 tablet (4 mg total) by mouth 2 (two) times daily. Johnson, Deborah B, MD Taking Active   ?glucose blood (ACCU-CHEK GUIDE) test strip [id number] Yes Check blood sugars once a day.  Accu-chek Guide Me Johnson, Deborah B, MD Taking Active   ?isosorbide mononitrate (IMDUR) 30 MG 24 hr tablet [id number] Yes Take 1 tablet (30 mg total) by mouth daily. Johnson, Deborah B, MD Taking Active   ?latanoprost (XALATAN) 0.005 % ophthalmic solution [id number] Yes 1 drop at bedtime. [provider] Taking Active   ?lisinopril (ZESTRIL) 30 MG tablet [id number] Yes Take 1 tablet (30 mg total) by mouth daily. Johnson, Deborah B, MD Taking Active   ?metFORMIN (GLUCOPHAGE) 1000 MG tablet [id number] Yes Take 1 tablet (1,000 mg total) by mouth 2 (two) times daily with a meal. Johnson,   Dalbert Batman, MD Taking Active   ?Multiple Vitamins-Minerals (MULTIVITAMIN WITH MINERALS) tablet [id number] Yes Take 1 tablet by mouth daily. [provider] Taking Active   ?rosuvastatin (CRESTOR) 10 MG tablet [id number] Yes Take 1 tablet (10 mg total) by mouth daily. [name], MD Taking Active   ?         ?Med Note (Mikayla Chiusano A   Mon Jun 19, 2021 11:40 AM) Taking everyother day  ?timolol (TIMOPTIC) 0.5 % ophthalmic solution [id number] Yes 1 drop every morning. [provider] Taking Active   ?         ?Med Note (Hudsyn Champine A   Thu Oct 27, 2020 12:55 PM) Using twice daily  ? ?  ?  ? ?  ? ? ?Patient Active Problem List  ? Diagnosis Date Noted  ? Hair thinning 10/28/2020  ? Coronary artery disease involving native coronary artery of native heart without angina pectoris 04/14/2020  ? Hyperlipidemia associated with type 2 diabetes mellitus ([place]) 04/14/2020  ? Pain due to onychomycosis of toenails of both feet 09/16/2019  ? Diabetes mellitus without complication ([place]) 37/34/2876  ? Influenza vaccine refused 11/27/2018  ? 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 Nuclear sclerotic cataract of both eyes 02/11/2018  ? Retinal edema 02/11/2018  ? 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 Vitreous hemorrhage, right eye ([place]) 02/11/2018  ? Legal blindness 07/25/2017  ? Type 2 diabetes mellitus with retinopathy of both eyes, with long-term current use of insulin ([place]) 02/14/2016  ? Essential hypertension 02/14/2016  ? Disorder of skin or subcutaneous tissue 04/24/2013  ? Onychomycosis due to dermatophyte 03/25/2013  ? Hallux valgus, acquired 03/25/2013  ? Ulcer of other part of foot 03/25/2013  ? ? ?Conditions to be addressed/monitored per PCP order:  HTN and DMII ? ?Care Plan : RN Care Manager Plan of Care  ?Updates made by Melissa Montane, RN since 06/19/2021 12:00 AM  ?  ? ?Problem: Disease management needs related to DMII, HTN and CAD   ?  ? ?Long-Range Goal: Development of Plan of Care for needs related to DMII, HTN and CAD   ?Start Date: 10/27/2020  ?Expected End Date: 08/07/2021  ?Priority: High  ?Note:   ?Current Barriers:  ?Chronic Disease Management support and education needs related to CAD, HTN, and DMII-Lisa Olson will follow up with PCP 08/03/21. She is working on improving her diet and compliance with taking medications.  She needs to find a new Ophthalmologist, [facility] no longer accepts [facility]. She attended follow up with Cardiology, received a good report and will not need to follow up for one year. ?Film/video editor.  ? ?RNCM Clinical Goal(s):  ?Patient will verbalize understanding of plan for management of CAD, HTN, and DMII ?work with community resource care guide to address needs related to Financial constraints related to housing and food insecurities    through collaboration with Consulting civil engineer, provider, and care team.  ?Work with MM Pharmacist for medication management-Met ? ?Interventions: ?Inter-disciplinary care team collaboration (see longitudinal plan of care) ?Evaluation of current treatment plan related to  self management and patient's adherence to plan as established by provider ?Provided patient with Medicaid Enrollment Broker information [phone] ?Advised patient to contact [facility] member services for enhanced member benefits(wellness benefit for vitamins) ? ? ?CAD  (Status: Goal Met.) ?Assessed understanding of CAD diagnosis ?Medications reviewed  including medications utilized in CAD treatment plan ?Counseled on importance of regular laboratory monitoring as prescribed; ?Reviewed Importance of taking all medications as prescribed ?Reviewed Importance of attending all scheduled provider appointments ?Assessed social determinant of health barriers;  ? ?Diabetes:  (Status: Goal on track: YES.) ?Lab Results  ?Component Value Date  ? HGBA1C 7.0 10/28/2020  ?Assessed patient's understanding of A1c goal: <7% ?Reviewed medications with patient and discussed importance of medication adherence;        ?Discussed plans with patient for ongoing care management follow up and provided patient with direct contact information for care management team;      ?Reviewed scheduled/upcoming provider appointments including: PCP 08/03/21            ?Review of patient status, including review of consultants reports, relevant laboratory  and other test results, and medications completed;       ?Discussed diet and exercise ?Advised patient to discuss needing Ophthalmology referral with Dr. Wynetta Emery during her visit on 08/03/21 ? ? ?Hypertensio

## 2021-06-20 ENCOUNTER — Ambulatory Visit (HOSPITAL_BASED_OUTPATIENT_CLINIC_OR_DEPARTMENT_OTHER): Payer: Medicaid Other | Admitting: Cardiology

## 2021-07-07 ENCOUNTER — Telehealth: Payer: Self-pay | Admitting: Internal Medicine

## 2021-07-07 NOTE — Telephone Encounter (Signed)
Copied from CRM [id number]. Topic: General - Other ?>> Jul 07, 2021  1:31 PM Lisa Olson wrote: ?Reason for CRM: Patient called in to reschedule her appointment next visit is 10/16/21 she does not want to wait that long for Olson visit since she have not seen Dr Laural Benes in over 6 months and waited 3 months for this visit that cancelled through no fault of hers. Please call patient at Ph# [phone] ?

## 2021-07-07 NOTE — Telephone Encounter (Signed)
Will route to our scheduling office to see if this patient can be rescheduled.  ?

## 2021-07-24 ENCOUNTER — Other Ambulatory Visit: Payer: Self-pay | Admitting: Internal Medicine

## 2021-07-25 NOTE — Telephone Encounter (Signed)
Change in pharmacy- remainder if Rx sent to new pharmacy ?Requested Prescriptions  ?Pending Prescriptions Disp Refills  ?? glucose blood test strip [Pharmacy Med Name: Accu-Chek Aviva Plus In Vitro Strip] 100 each 2  ?  Sig: USE 1 STRIP TO CHECK GLUCOSE THREE TIMES DAILY AS DIRECTED  ?  ? Endocrinology: Diabetes - Testing Supplies Passed - 07/24/2021  5:54 PM  ?  ?  Passed - Valid encounter within last 12 months  ?  Recent Outpatient Visits   ?      ? 9 months ago Type 2 diabetes mellitus with obesity (HCC)  ? [facility] Marcine Matar, MD  ? 1 year ago Diabetic retinopathy of both eyes associated with type 2 diabetes mellitus, macular edema presence unspecified, unspecified retinopathy severity (HCC)  ? [facility] [place], Odette Horns, MD  ? 1 year ago Type 2 diabetes mellitus with obesity (HCC)  ? [facility] Marcine Matar, MD  ? 2 years ago Controlled type 2 diabetes mellitus with complication, without long-term current use of insulin (HCC)  ? [facility] Marcine Matar, MD  ? 2 years ago Controlled type 2 diabetes mellitus with complication, without long-term current use of insulin (HCC)  ? [facility] Marcine Matar, MD  ?  ?  ? ?  ?  ?  ? ?

## 2021-07-31 NOTE — Progress Notes (Signed)
[facility] Note  08/02/2021     CHIEF COMPLAINT Patient presents for Retina Follow Up   HISTORY OF PRESENT ILLNESS: Lisa Olson is a 61 y.o. female who presents to the clinic today for:   HPI     Retina Follow Up   Patient presents with  Diabetic Retinopathy.  In both eyes.  Duration of 12 months.  Since onset it is stable.  I, the attending physician,  performed the HPI with the patient and updated documentation appropriately.        Comments   12 month follow up PDR OU- No changes since last visit. Using Durezol qd OD, Timolol BID OU, and Latanoprost qhs OS. Only uses Latanoprost when she feels like the pressure is up in that eye. Has been without Latanoprost for about a week. BS 117 A1C under 7      Last edited by [name], MD on 08/03/2021 10:47 PM.      Referring physician: [name], MD [place],  [place] [id number]  HISTORICAL INFORMATION:   Selected notes from the MEDICAL RECORD NUMBER Referred by Dr. Wyatt Portela for DM exam LEE: 05.22.19 (S. Groat) [BCVA: OD: HM OS: HM]  Ocular Hx-Cataract OU, Diabetic Retinopathy OU, Retinal Vein Occlusion PMH-DM (last A1C 7.2, taking Metformin), HTN   CURRENT MEDICATIONS: Current Outpatient Medications (Ophthalmic Drugs)  Medication Sig   Difluprednate (DUREZOL) 0.05 % EMUL Place 1 drop into the right eye daily.   DUREZOL 0.05 % EMUL Place 1 drop into the right eye 2 (two) times daily.   latanoprost (XALATAN) 0.005 % ophthalmic solution 1 drop at bedtime.   latanoprost (XALATAN) 0.005 % ophthalmic solution Place 1 drop into the left eye at bedtime.   timolol (TIMOPTIC) 0.5 % ophthalmic solution 1 drop every morning.   timolol (TIMOPTIC) 0.5 % ophthalmic solution Place 1 drop into both eyes 2 (two) times daily.   No current facility-administered medications for this visit. (Ophthalmic Drugs)   Current Outpatient Medications (Other)  Medication Sig   Ascorbic  Acid (VITAMIN C ER PO) Take by mouth.   carvedilol (COREG) 3.125 MG tablet Take 1 tablet (3.125 mg total) by mouth 2 (two) times daily with a meal.   ferrous sulfate 325 (65 FE) MG tablet Take 1 tablet (325 mg total) by mouth daily with breakfast.   furosemide (LASIX) 20 MG tablet Take 1 tablet (20 mg total) by mouth daily. Stop Hydrochlorothiazide   glimepiride (AMARYL) 4 MG tablet Take 1 tablet (4 mg total) by mouth 2 (two) times daily.   isosorbide mononitrate (IMDUR) 30 MG 24 hr tablet Take 1 tablet (30 mg total) by mouth daily.   lisinopril (ZESTRIL) 30 MG tablet Take 1 tablet (30 mg total) by mouth daily.   metFORMIN (GLUCOPHAGE) 1000 MG tablet Take 1 tablet (1,000 mg total) by mouth 2 (two) times daily with a meal.   Multiple Vitamins-Minerals (MULTIVITAMIN WITH MINERALS) tablet Take 1 tablet by mouth daily.   rosuvastatin (CRESTOR) 10 MG tablet Take 1 tablet (10 mg total) by mouth daily.   Accu-Chek FastClix Lancets MISC Use to check blood sugar once daily. E11.69   Blood Glucose Monitoring Suppl (ACCU-CHEK GUIDE ME) w/Device KIT Use to check blood sugar once daily. E11.69   glucose blood test strip USE 1 STRIP TO CHECK GLUCOSE THREE TIMES DAILY AS DIRECTED   No current facility-administered medications for this visit. (Other)   REVIEW OF SYSTEMS: ROS  Positive for: Musculoskeletal, Endocrine, Eyes Negative for: Constitutional, Gastrointestinal, Neurological, Skin, Genitourinary, HENT, Cardiovascular, Respiratory, Psychiatric, Allergic/Imm, Heme/Lymph Last edited by Leonie Douglas, COA on 08/02/2021  2:21 PM.     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 ([place])    Past Surgical History:  Procedure Laterality Date   EYE SURGERY     LASIK     RETINAL DETACHMENT SURGERY      FAMILY HISTORY Family  History  Problem Relation Age of Onset   Diabetes Mother    Diabetes Father    Hypertension Sister     SOCIAL HISTORY Social History   Tobacco Use   Smoking status: Never   Smokeless tobacco: Never  Vaping Use   Vaping Use: Never used  Substance Use Topics   Drug use: No       OPHTHALMIC EXAM:  Base Eye Exam     Visual Acuity (Snellen - Linear)       Right Left   Dist Reading LP CF at face         Tonometry (Tonopen, 2:30 PM)       Right Left   Pressure 15 25  Has not used Latanoprost for 1 week now.        Pupils       Dark Light Shape React APD   Right 2 1.5 Irregular Minimal None   Left 2 1 Round Minimal None         Visual Fields (Counting fingers)       Left Right   Restrictions Total superior temporal, inferior temporal, superior nasal, inferior nasal deficiencies Total superior temporal, inferior temporal, superior nasal, inferior nasal deficiencies         Extraocular Movement       Right Left    RXT     -- -- --  --  --  -- -- --   -- -- --  --  --  -- -- --           Neuro/Psych     Oriented x3: Yes   Mood/Affect: Normal           Slit Lamp and Fundus Exam     Slit Lamp Exam       Right Left   Lids/Lashes Dermatochalasis - upper lid Mild Meibomian gland dysfunction   Conjunctiva/Sclera Mild Melanosis, mild inferior Conjunctivochalasis Nasal and Temporal Pinguecula, Melanosis   Cornea Mild Arcus, Mild debris in tear film Mild Arcus, trace Punctate epithelial erosions   Anterior Chamber Shallow with IK touch temporally Moderate depth, clear, narrow temporal angle   Iris almost 360 Posterior synechiae with pupil size approx 59m, anterior bowing Round and dilated to 5.533m No NVI, mild anterior bowing   Lens Mature white cataract 2-3+ Nuclear sclerosis with brunescence, 2-3+ Cortical cataract, 1-2+ Posterior subcapsular cataract, Vacuoles   Anterior Vitreous Mild Vitreous syneresis Mild Vitreous syneresis          Fundus Exam       Right Left   Disc No view mild Pallor, fibrosis, temporal PPA   C/D Ratio  0.2   Macula No view Flat, atrophic, fibrosis and pigmented scarring temporal half of macula   Vessels No view Severe Vascular attenuation   Periphery No view Attached, 360 PRP, fibrosis nasal to disc, room for  PRP fill-in superiorly            IMAGING AND PROCEDURES  Imaging and Procedures for $RemoveBefore'@TODAY'[id number]$ @  OCT, Retina - OU - Both Eyes       Right Eye Quality was poor (uninterruptable ). Findings include preretinal fibrosis (No view).   Left Eye Quality was good. Central Foveal Thickness: 221. Progression has been stable. Findings include abnormal foveal contour, epiretinal membrane, lamellar hole, outer retinal atrophy, retinal drusen , subretinal hyper-reflective material, intraretinal fluid (Sub-retinal fibrosis/SRHM temporal macula caught on widefield).   Notes *Images captured and stored on drive  Diagnosis / Impression:  OD: no view OS: diffuse retinal atrophy, ERM with lamellar hole -- stable  Clinical management:  See below  Abbreviations: NFP - Normal foveal profile. CME - cystoid macular edema. PED - pigment epithelial detachment. IRF - intraretinal fluid. SRF - subretinal fluid. EZ - ellipsoid zone. ERM - epiretinal membrane. ORA - outer retinal atrophy. ORT - outer retinal tubulation. SRHM - subretinal hyper-reflective material             ASSESSMENT/PLAN:    ICD-10-CM   1. 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 Macular hole of right eye  H35.341     3. Retinal detachment, tractional, right eye  H33.41     4. Posterior synechiae of iris, right  H21.541     5. Stable proliferative diabetic retinopathy of left eye associated with type 2 diabetes mellitus ([place])  Z30.8657     6. Bilateral  ocular hypertension  H40.053     7. Combined forms of age-related cataract of both eyes  H25.813      1-4. 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D  - pt to lost to f/u since 7.3.2019  - pt with long standing history of low vision / legal blindness  - onset of vision loss ~2015 in [place] -- underwent PRP laser OD, PPV OS   - today, reports no acute change in vision OD, but prior exam showed persistent complete macular detachment with macular hole -- wolf-jaw fibrosis emanating from the disc; periphery attached with good PRP in place  - discussed findings, very guarded prognosis, and treatment options  - given longstanding VA of HM OD, unlikely that anatomic reattachment of retina will yield significantly improved vision  - discussed possibility of surgery making vision worse  - pt wishes to monitor for now  - continue Durezol qd OD  - f/u here in 1 year, sooner prn, to monitor for any acute changes in symptoms or exam  5. Stable PDR OS  - s/p PPV and laser PRP OS ~2015 in [place]  - significant macular atrophy OS  - no active disease at this time  - room for PRP fill in peripherally if needed  - monitor  6. Ocular Hypertension OU - discussed with patient - discussed importance of medication compliance  - continue Timolol BID OU and Latanoprost qhs OS  7. Mixed Cataract OU - The symptoms of cataract, surgical options, and treatments and risks were discussed with patient. - discussed diagnosis and progression - discussed likelihood that CE/IOL OS may not improve visual acuity, but overall brightness of vision may improve with cataract surgery - recommend evaluation with cataract surgeon   Ophthalmic Meds Ordered this visit:  Meds ordered this encounter  Medications  Difluprednate (DUREZOL) 0.05 % EMUL    Sig: Place 1 drop into the right eye daily.    Dispense:  1 mL    Refill:  11   latanoprost (XALATAN) 0.005 % ophthalmic solution    Sig: Place 1  drop into the left eye at bedtime.    Dispense:  2.5 mL    Refill:  7   timolol (TIMOPTIC) 0.5 % ophthalmic solution    Sig: Place 1 drop into both eyes 2 (two) times daily.    Dispense:  5 mL    Refill:  11     Return in about 1 year (around 08/03/2022) for DFE, OCT.  There are no Patient Instructions on file for this visit.   Explained the diagnoses, plan, and follow up with the patient and they expressed understanding.  Patient expressed understanding of the importance of proper follow up care.   This document serves as a record of services personally performed by Gardiner Sleeper, MD, PhD. It was created on their behalf by Orvan Falconer, an ophthalmic technician. The creation of this record is the provider's dictation and/or activities during the visit.    Electronically signed by: Orvan Falconer, OA, 08/03/21  10:50 PM   Gardiner Sleeper, M.D., Ph.D. Diseases & Surgery of the Retina and Vitreous [facility]  I have reviewed the above documentation for accuracy and completeness, and I agree with the above. Gardiner Sleeper, M.D., Ph.D. 08/03/21 10:56 PM   Abbreviations: M myopia (nearsighted); A astigmatism; H hyperopia (farsighted); P presbyopia; Mrx spectacle prescription;  CTL contact lenses; OD right eye; OS left eye; OU both eyes  XT exotropia; ET esotropia; PEK punctate epithelial keratitis; PEE punctate epithelial erosions; DES dry eye syndrome; MGD meibomian gland dysfunction; ATs artificial tears; PFAT's preservative free artificial tears; [place] nuclear sclerotic cataract; PSC posterior subcapsular cataract; ERM epi-retinal membrane; PVD posterior vitreous detachment; RD retinal detachment; DM diabetes mellitus; DR diabetic retinopathy; NPDR non-proliferative diabetic retinopathy; PDR proliferative diabetic retinopathy; CSME clinically significant macular edema; DME diabetic macular edema; dbh dot blot hemorrhages; CWS cotton wool spot; POAG primary  open angle glaucoma; C/D cup-to-disc ratio; HVF humphrey visual field; GVF goldmann visual field; OCT optical coherence tomography; IOP intraocular pressure; BRVO Branch retinal vein occlusion; CRVO central retinal vein occlusion; CRAO central retinal artery occlusion; BRAO branch retinal artery occlusion; RT retinal tear; SB scleral buckle; PPV pars plana vitrectomy; VH Vitreous hemorrhage; PRP panretinal laser photocoagulation; IVK intravitreal kenalog; VMT vitreomacular traction; MH Macular hole;  NVD neovascularization of the disc; NVE neovascularization elsewhere; AREDS age related eye disease study; ARMD age related macular degeneration; POAG primary open angle glaucoma; EBMD epithelial/anterior basement membrane dystrophy; ACIOL anterior chamber intraocular lens; IOL intraocular lens; PCIOL posterior chamber intraocular lens; Phaco/IOL phacoemulsification with intraocular lens placement; [place] photorefractive keratectomy; LASIK laser assisted in situ keratomileusis; HTN hypertension; DM diabetes mellitus; COPD chronic obstructive pulmonary disease

## 2021-08-02 ENCOUNTER — Encounter (INDEPENDENT_AMBULATORY_CARE_PROVIDER_SITE_OTHER): Payer: Self-pay | Admitting: Ophthalmology

## 2021-08-02 ENCOUNTER — Ambulatory Visit (INDEPENDENT_AMBULATORY_CARE_PROVIDER_SITE_OTHER): Payer: Medicaid Other | Admitting: Ophthalmology

## 2021-08-02 DIAGNOSIS — E113552 Type 2 diabetes mellitus with stable proliferative diabetic retinopathy, left eye: Secondary | ICD-10-CM

## 2021-08-02 DIAGNOSIS — H3341 Traction detachment of retina, right eye: Secondary | ICD-10-CM

## 2021-08-02 DIAGNOSIS — E113541 Type 2 diabetes mellitus with proliferative diabetic retinopathy with combined traction retinal detachment and rhegmatogenous retinal detachment, right eye: Secondary | ICD-10-CM

## 2021-08-02 DIAGNOSIS — H35341 Macular cyst, hole, or pseudohole, right eye: Secondary | ICD-10-CM | POA: Diagnosis not present

## 2021-08-02 DIAGNOSIS — H21541 Posterior synechiae (iris), right eye: Secondary | ICD-10-CM | POA: Diagnosis not present

## 2021-08-02 DIAGNOSIS — H40053 Ocular hypertension, bilateral: Secondary | ICD-10-CM | POA: Diagnosis not present

## 2021-08-02 DIAGNOSIS — H25813 Combined forms of age-related cataract, bilateral: Secondary | ICD-10-CM

## 2021-08-02 MED ORDER — LATANOPROST 0.005 % OP SOLN: 1.0000 [drp] | Freq: Every day | OPHTHALMIC | 7 refills | Status: AC

## 2021-08-02 MED ORDER — DIFLUPREDNATE 0.05 % OP EMUL: 1.0000 [drp] | Freq: Every day | OPHTHALMIC | 11 refills | Status: DC

## 2021-08-02 MED ORDER — TIMOLOL MALEATE 0.5 % OP SOLN: 1.0000 [drp] | Freq: Two times a day (BID) | OPHTHALMIC | 11 refills | Status: AC

## 2021-08-03 ENCOUNTER — Encounter (INDEPENDENT_AMBULATORY_CARE_PROVIDER_SITE_OTHER): Payer: Self-pay | Admitting: Ophthalmology

## 2021-08-03 ENCOUNTER — Ambulatory Visit: Payer: Medicaid Other | Admitting: Internal Medicine

## 2021-08-07 ENCOUNTER — Other Ambulatory Visit: Payer: Self-pay | Admitting: *Deleted

## 2021-08-07 ENCOUNTER — Other Ambulatory Visit (INDEPENDENT_AMBULATORY_CARE_PROVIDER_SITE_OTHER): Payer: Self-pay

## 2021-08-07 MED ORDER — PREDNISOLONE ACETATE 1 % OP SUSP: 1.0000 [drp] | Freq: Every day | OPHTHALMIC | 11 refills | Status: DC

## 2021-08-07 NOTE — Patient Outreach (Signed)
Care Coordination  08/07/2021  Rico [place] [birth year] [id number]   Medicaid Managed Care   Unsuccessful Outreach Note  08/07/2021 Name: Astara Marocco MRN: [id number] DOB: 09-25-1960  Referred by: [name], MD Reason for referral : High Risk Managed Medicaid (Unsuccessful RNCM follow up telephone outreach)   An unsuccessful telephone outreach was attempted today. The patient was referred to the case management team for assistance with care management and care coordination.   Follow Up Plan: A HIPAA compliant phone message was left for the patient providing contact information and requesting a return call.   Lurena Joiner RN, BSN   [employer] manager

## 2021-08-07 NOTE — Patient Instructions (Signed)
Visit Information  [name]  - as a part of your Medicaid benefit, you are eligible for care management and care coordination services at no cost or copay. I was unable to reach you by phone today but would be happy to help you with your health related needs. Please feel free to call me @  [phone].   A member of the Managed Medicaid care management team will reach out to you again over the next 14 days.   Shin Lamour RN, BSN [place]  [facility] RN Care Coordinator   

## 2021-08-22 ENCOUNTER — Ambulatory Visit: Payer: Medicaid Other | Attending: Internal Medicine | Admitting: Internal Medicine

## 2021-08-22 ENCOUNTER — Encounter: Payer: Self-pay | Admitting: Internal Medicine

## 2021-08-22 VITALS — BP 150/91 | HR 83 | Temp 98.1°F | Resp 16 | Wt 228.8 lb

## 2021-08-22 DIAGNOSIS — I152 Hypertension secondary to endocrine disorders: Secondary | ICD-10-CM | POA: Diagnosis not present

## 2021-08-22 DIAGNOSIS — Z1231 Encounter for screening mammogram for malignant neoplasm of breast: Secondary | ICD-10-CM

## 2021-08-22 DIAGNOSIS — I2510 Atherosclerotic heart disease of native coronary artery without angina pectoris: Secondary | ICD-10-CM | POA: Diagnosis not present

## 2021-08-22 DIAGNOSIS — E1142 Type 2 diabetes mellitus with diabetic polyneuropathy: Secondary | ICD-10-CM | POA: Diagnosis not present

## 2021-08-22 DIAGNOSIS — E1159 Type 2 diabetes mellitus with other circulatory complications: Secondary | ICD-10-CM

## 2021-08-22 DIAGNOSIS — H6692 Otitis media, unspecified, left ear: Secondary | ICD-10-CM | POA: Diagnosis not present

## 2021-08-22 DIAGNOSIS — Z6835 Body mass index (BMI) 35.0-35.9, adult: Secondary | ICD-10-CM | POA: Diagnosis not present

## 2021-08-22 DIAGNOSIS — E785 Hyperlipidemia, unspecified: Secondary | ICD-10-CM

## 2021-08-22 DIAGNOSIS — E1169 Type 2 diabetes mellitus with other specified complication: Secondary | ICD-10-CM | POA: Diagnosis not present

## 2021-08-22 LAB — POCT GLYCOSYLATED HEMOGLOBIN (HGB A1C): Hemoglobin A1C: 7.1 % — AB (ref 4.0–5.6)

## 2021-08-22 LAB — GLUCOSE, POCT (MANUAL RESULT ENTRY): POC Glucose: 207 mg/dl — AB (ref 70–99)

## 2021-08-22 MED ORDER — AMOXICILLIN-POT CLAVULANATE 500-125 MG PO TABS: 1.0000 | ORAL_TABLET | Freq: Two times a day (BID) | ORAL | 0 refills | Status: DC

## 2021-08-22 MED ORDER — CARVEDILOL 6.25 MG PO TABS: 6.2500 mg | ORAL_TABLET | Freq: Two times a day (BID) | ORAL | 6 refills | Status: DC

## 2021-08-22 NOTE — Progress Notes (Signed)
Patient ID: Lisa Olson, female    DOB: 03/10/1961  MRN: [id number]  CC: Chronic disease management  Subjective: [name] is a 61 y.o. 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  Attributes it to swimmer's ear.  Travel to [place] and went swimming every day for a few weeks.  Developed swimmer's ear and was seen by a physician there.  Prescribed Augmentin which she took for 2 days and then stopped taking after her ear felt better Now she is having pain and soreness in the ear again.  No drainage at this time.   HTN/HL/CAD:  compliant with meds and salt restriction.  Medications include carvedilol 3.125 mg twice a day, lisinopril 30 mg daily, furosemide 20 mg daily, Crestor 10 mg daily and Imdur 30 mg daily.  Saw the cardiologist Dr. Harrell Gave on 06/12/2021.  Not on aspirin due to prior retinal hemorrhage. Has new BP machine but has not unpacked yet. Denies any chest pains or shortness of breath.  No lower extremity edema.  DM: Results for orders placed or performed in visit on 08/22/21  POCT glycosylated hemoglobin (Hb A1C)  Result Value Ref Range   Hemoglobin A1C 7.1 (A) 4.0 - 5.6 %   HbA1c POC (<> result, manual entry)     HbA1c, POC (prediabetic range)     HbA1c, POC (controlled diabetic range)    POCT glucose (manual entry)  Result Value Ref Range   POC Glucose 207 (A) 70 - 99 mg/dl  Doing well with eating habits.  No major weight changes. Walking QOD Compliant with metformin 1 g twice a day and Amaryl 4 mg twice a day. Thinks she has poor circulation in feet Walking QOD.  No pain with walking.  Numbness in legs when she lays down.  She has known diabetic peripheral neuropathy.  Followed by Dr. Coralyn Pear her retinal specialist.  She was on several eyedrops.  Last  seen 08/02/2021.  HM:  Had Tdapt at [facility].  Due for MMG.  Pt states GI canceled on her 3 times.  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, associated with type 2 diabetes mellitus ([place]) 05/22/2018   Nuclear sclerotic cataract of both eyes 02/11/2018   Retinal edema 02/11/2018   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 E11.69 100 each 2   Ascorbic Acid (VITAMIN C ER PO) Take by mouth.     Blood Glucose Monitoring Suppl (ACCU-CHEK GUIDE ME) w/Device KIT Use to check blood sugar once daily. E11.69 1 kit 0   Difluprednate (DUREZOL) 0.05 % EMUL Place 1 drop into the right eye daily. 1 mL 11   DUREZOL 0.05 % EMUL Place 1 drop into the right eye 2 (two) times daily.     ferrous sulfate 325 (65 FE) MG  tablet Take 1 tablet (325 mg total) by mouth daily with breakfast. 100 tablet 3   furosemide (LASIX) 20 MG tablet Take 1 tablet (20 mg total) by mouth daily. Stop Hydrochlorothiazide 90 tablet 2   glimepiride (AMARYL) 4 MG tablet Take 1 tablet (4 mg total) by mouth 2 (two) times daily. 180 tablet 3   glucose blood test strip USE 1 STRIP TO CHECK GLUCOSE THREE TIMES DAILY AS DIRECTED 100 each 3   isosorbide mononitrate (IMDUR) 30 MG 24 hr tablet Take 1 tablet (30 mg total) by mouth daily. 90 tablet 3   latanoprost (XALATAN) 0.005 % ophthalmic solution 1 drop at bedtime.     latanoprost (XALATAN) 0.005 % ophthalmic solution Place 1 drop into the left eye at bedtime. 2.5 mL 7   lisinopril (ZESTRIL) 30 MG tablet Take 1 tablet (30 mg total) by mouth daily. 90 tablet 3   metFORMIN (GLUCOPHAGE) 1000 MG tablet Take 1 tablet (1,000 mg total) by mouth 2 (two) times daily with a meal. 180 tablet 3   Multiple Vitamins-Minerals (MULTIVITAMIN WITH MINERALS) tablet Take 1 tablet by mouth daily.     prednisoLONE acetate (PRED FORTE) 1 % ophthalmic suspension Place 1 drop into the right eye daily. 10 mL 11   rosuvastatin (CRESTOR) 10 MG tablet Take 1 tablet (10 mg total) by mouth daily. 90 tablet 3   timolol (TIMOPTIC) 0.5 % ophthalmic solution 1 drop every morning.     timolol (TIMOPTIC) 0.5 % ophthalmic solution Place 1 drop into both eyes 2 (two) times daily. 5 mL 11   No current facility-administered medications on file prior to visit.    No Known Allergies  Social History   Socioeconomic History   Marital status: Single    Spouse name: Not on file   Number of children: Not on file   Years of education: Not on file   Highest education level: Not on file  Occupational History   Not on file  Tobacco Use   Smoking status: Never   Smokeless tobacco: Never  Vaping Use   Vaping Use: Never used  Substance and Sexual Activity   Alcohol use: Not on file   Drug use: No   Sexual activity: Not on file   Other Topics Concern   Not on file  Social History Narrative   Not on file   Social Determinants of Health   Financial Resource Strain: Medium Risk   Difficulty of Paying Living Expenses: Somewhat hard  Food Insecurity: Food Insecurity Present   Worried About Running Out of Food in the Last Year: Sometimes true   Ran Out of Food in the Last Year: Sometimes true  Transportation Needs: No Transportation Needs   Lack of Transportation (Medical): No   Lack of Transportation (Non-Medical): No  Physical Activity: Not on file  Stress: Not on file  Social Connections: Moderately Integrated   Frequency of Communication  with Friends and Family: More than three times a week   Frequency of Social Gatherings with Friends and Family: Once a week   Attends Religious Services: More than 4 times per year   Active Member of [employer] or Organizations: Yes   Attends Music therapist: More than 4 times per year   Marital Status: Separated  Intimate Partner Violence: Not on file    Family History  Problem Relation Age of Onset   Diabetes Mother    Diabetes Father    Hypertension Sister     Past Surgical History:  Procedure Laterality Date   EYE SURGERY     LASIK     RETINAL DETACHMENT SURGERY      ROS: Review of Systems Negative except as stated above  PHYSICAL EXAM: BP (!) 150/91   Pulse 83   Temp 98.1 F (36.7 C) (Oral)   Resp 16   Wt 228 lb 12.8 oz (103.8 kg)   SpO2 98%   BMI 35.84 kg/m   Wt Readings from Last 3 Encounters:  08/22/21 228 lb 12.8 oz (103.8 kg)  06/12/21 229 lb 4.8 oz (104 kg)  12/08/20 230 lb (104.3 kg)  Repeat blood pressure 163/91  Physical Exam  General appearance - alert, well appearing, older obese female and in no distress Mental status - normal mood, behavior, speech, dress, motor activity, and thought processes Ears -mild tenderness on palpation of the left pinna.  Ear canal looks okay.  Tympanic membrane slightly erythematous. Neck -  supple, no significant adenopathy Chest - clear to auscultation, no wheezes, rales or rhonchi, symmetric air entry Heart - normal rate, regular rhythm, normal S1, S2, no murmurs, rubs, clicks or gallops Extremities -no lower extremity edema.      Latest Ref Rng & Units 04/14/2020    2:02 PM 05/25/2019    3:30 PM 06/03/2018   11:13 AM  CMP  Glucose 65 - 99 mg/dL 240   122   202    BUN 8 - 27 mg/dL _0 Creatinine 0.57 - 1.00 mg/dL 0.84   0.91   0.88    Sodium 134 - 144 mmol/L 138   138   136    Potassium 3.5 - 5.2 mmol/L 4.3   4.2   4.4    Chloride 96 - 106 mmol/L 99   100   99    CO2 20 - 29 mmol/L _1 Calcium 8.7 - 10.3 mg/dL 9.2   9.1   9.0    Total Protein 6.0 - 8.5 g/dL 7.4   7.1   6.7    Total Bilirubin 0.0 - 1.2 mg/dL 0.2   0.3   0.3    Alkaline Phos 44 - 121 IU/L 77   70   70    AST 0 - 40 IU/L _2 ALT 0 - 32 IU/L _3 Lipid Panel     Component Value Date/Time   CHOL 182 10/28/2020 0939   TRIG 182 (H) 10/28/2020 0939   HDL 43 10/28/2020 0939   CHOLHDL 4.2 10/28/2020 0939   CHOLHDL 4.7 05/14/2016 1607   VLDL 44 (H) 05/14/2016 1607   LDLCALC 107 (H) 10/28/2020 0939    CBC    Component Value Date/Time   WBC 4.9 04/14/2020 1402   WBC  SEE NOTE 02/13/2016 1746   RBC 4.47 04/14/2020 1402   RBC CANCELED 02/13/2016 1746   HGB 11.0 (L) 04/14/2020 1402   HCT 36.1 04/14/2020 1402   PLT 288 04/14/2020 1402   MCV 81 04/14/2020 1402   MCH 24.6 (L) 04/14/2020 1402   MCH CANCELED 02/13/2016 1746   MCHC 30.5 (L) 04/14/2020 1402   MCHC CANCELED 02/13/2016 1746   RDW 12.8 04/14/2020 1402   LYMPHSABS CANCELED 02/13/2016 1746   MONOABS CANCELED 02/13/2016 1746   EOSABS CANCELED 02/13/2016 1746   BASOSABS CANCELED 02/13/2016 1746    ASSESSMENT AND PLAN:  1. DM type 2 with diabetic peripheral neuropathy ([place]) Close to goal with her A1c.  We will have her continue current dose of metformin and Amaryl.  Continue healthy eating  habits and regular exercise. - POCT glycosylated hemoglobin (Hb A1C) - POCT glucose (manual entry) - CBC - Comprehensive metabolic panel - Lipid panel - Microalbumin / creatinine urine ratio  2. Hypertension associated with diabetes ([place]) Not at goal. Increase carvedilol to 6.25 mg twice a day. Encouraged to check blood pressure twice a week at home with goal being 130/80 or lower. - carvedilol (COREG) 6.25 MG tablet; Take 1 tablet (6.25 mg total) by mouth 2 (two) times daily with a meal.  Dispense: 60 tablet; Refill: 6  3. Otitis of left ear Prescription sent for Augmentin to take for 7 days.  4. Coronary artery disease involving native coronary artery of native heart without angina pectoris Stable.  Continue carvedilol, Crestor, Imdur.  Not on aspirin due to history of retinal hemorrhage. - carvedilol (COREG) 6.25 MG tablet; Take 1 tablet (6.25 mg total) by mouth 2 (two) times daily with a meal.  Dispense: 60 tablet; Refill: 6  5. Class 2 severe obesity with serious comorbidity and body mass index (BMI) of 35.0 to 35.9 in adult, unspecified obesity type [facility]) Encouraged her to continue regular exercise. Continue healthy eating habits.  6. Hyperlipidemia due to type 2 diabetes mellitus ([place]) Continue Crestor.  7. Encounter for screening mammogram for malignant neoplasm of breast - MM Digital Screening; Future  I will have our pharmacy student call [employer] to get the information of the Tdap so that we can update her health maintenance.  Patient was given the opportunity to ask questions.  Patient verbalized understanding of the plan and was able to repeat key elements of the plan.   This documentation was completed using Radio producer.  Any transcriptional errors are unintentional.  Orders Placed This Encounter  Procedures   MM Digital Screening   CBC   Comprehensive metabolic panel   Lipid panel   Microalbumin / creatinine urine ratio   POCT  glycosylated hemoglobin (Hb A1C)   POCT glucose (manual entry)     Requested Prescriptions   Signed Prescriptions Disp Refills   carvedilol (COREG) 6.25 MG tablet 60 tablet 6    Sig: Take 1 tablet (6.25 mg total) by mouth 2 (two) times daily with a meal.   amoxicillin-clavulanate (AUGMENTIN) 500-125 MG tablet 14 tablet 0    Sig: Take 1 tablet (500 mg total) by mouth 2 (two) times daily with a meal.    Return in about 6 weeks (around 10/03/2021) for PAP.  Karle Plumber, MD, FACP

## 2021-08-22 NOTE — Patient Instructions (Signed)
We have increased the carvedilol to 6.25 mg twice a day for better control of your blood pressure.

## 2021-08-22 NOTE — Progress Notes (Signed)
Patient is concern about having poor circulation in her legs. Patient has been having an ear infection and need something for it

## 2021-08-23 ENCOUNTER — Other Ambulatory Visit: Payer: Self-pay | Admitting: Internal Medicine

## 2021-08-23 ENCOUNTER — Other Ambulatory Visit: Payer: Self-pay | Admitting: *Deleted

## 2021-08-23 DIAGNOSIS — E118 Type 2 diabetes mellitus with unspecified complications: Secondary | ICD-10-CM

## 2021-08-23 DIAGNOSIS — I10 Essential (primary) hypertension: Secondary | ICD-10-CM

## 2021-08-23 LAB — MICROALBUMIN / CREATININE URINE RATIO
Creatinine, Urine: 114.2 mg/dL
Microalb/Creat Ratio: 38 mg/g creat — ABNORMAL HIGH (ref 0–29)
Microalbumin, Urine: 43.3 ug/mL

## 2021-08-23 LAB — COMPREHENSIVE METABOLIC PANEL
ALT: 17 IU/L (ref 0–32)
AST: 15 IU/L (ref 0–40)
Albumin/Globulin Ratio: 1.4 (ref 1.2–2.2)
Albumin: 4.6 g/dL (ref 3.8–4.8)
Alkaline Phosphatase: 66 IU/L (ref 44–121)
BUN/Creatinine Ratio: 17 (ref 12–28)
BUN: 15 mg/dL (ref 8–27)
Bilirubin Total: 0.3 mg/dL (ref 0.0–1.2)
CO2: 27 mmol/L (ref 20–29)
Calcium: 9.5 mg/dL (ref 8.7–10.3)
Chloride: 99 mmol/L (ref 96–106)
Creatinine, Ser: 0.87 mg/dL (ref 0.57–1.00)
Globulin, Total: 3.4 g/dL (ref 1.5–4.5)
Glucose: 152 mg/dL — ABNORMAL HIGH (ref 70–99)
Potassium: 4.1 mmol/L (ref 3.5–5.2)
Sodium: 141 mmol/L (ref 134–144)
Total Protein: 8 g/dL (ref 6.0–8.5)
eGFR: 75 mL/min/{1.73_m2} (ref >59)

## 2021-08-23 LAB — LIPID PANEL
Chol/HDL Ratio: 4.1 ratio (ref 0.0–4.4)
Cholesterol, Total: 185 mg/dL (ref 100–199)
HDL: 45 mg/dL (ref >39)
LDL Chol Calc (NIH): 109 mg/dL — ABNORMAL HIGH (ref 0–99)
Triglycerides: 177 mg/dL — ABNORMAL HIGH (ref 0–149)
VLDL Cholesterol Cal: 31 mg/dL (ref 5–40)

## 2021-08-23 LAB — CBC
Hematocrit: 35.2 % (ref 34.0–46.6)
Hemoglobin: 11.2 g/dL (ref 11.1–15.9)
MCH: 25.4 pg — ABNORMAL LOW (ref 26.6–33.0)
MCHC: 31.8 g/dL (ref 31.5–35.7)
MCV: 80 fL (ref 79–97)
Platelets: 307 10*3/uL (ref 150–450)
RBC: 4.41 x10E6/uL (ref 3.77–5.28)
RDW: 13 % (ref 11.7–15.4)
WBC: 5.6 10*3/uL (ref 3.4–10.8)

## 2021-08-23 MED ORDER — FUROSEMIDE 20 MG PO TABS: 20.0000 mg | ORAL_TABLET | Freq: Every day | ORAL | 2 refills | Status: DC

## 2021-08-23 MED ORDER — ISOSORBIDE MONONITRATE ER 30 MG PO TB24: 30.0000 mg | ORAL_TABLET | Freq: Every day | ORAL | 3 refills | Status: DC

## 2021-08-23 MED ORDER — GLIMEPIRIDE 4 MG PO TABS: 4.0000 mg | ORAL_TABLET | Freq: Two times a day (BID) | ORAL | 3 refills | Status: DC

## 2021-08-23 MED ORDER — LISINOPRIL 30 MG PO TABS: 30.0000 mg | ORAL_TABLET | Freq: Every day | ORAL | 3 refills | Status: DC

## 2021-08-23 MED ORDER — METFORMIN HCL 1000 MG PO TABS: 1000.0000 mg | ORAL_TABLET | Freq: Two times a day (BID) | ORAL | 3 refills | Status: DC

## 2021-08-23 NOTE — Patient Instructions (Signed)
Visit Information  Ms. Zhao was given information about Medicaid Managed Care team care coordination services as a part of their [place] Medicaid benefit. [name] verbally consented to engagement with the [facility] Managed Care team.   If you are experiencing a medical emergency, please call 911 or report to your local emergency department or urgent care.   If you have a non-emergency medical problem during routine business hours, please contact your provider's office and ask to speak with a nurse.   For questions related to your [facility], please call: [phone] or visit the homepage here: [email or web address]  If you would like to schedule transportation through your [facility] please call the following number at least 2 days in advance of your appointment: [phone].  Rides for urgent appointments can also be made after hours by calling Member Services.  Call the [employer] at [phone], at any time, 24 hours a day, 7 days a week. If you are in danger or need immediate medical attention call 911.  If you would like help to quit smoking, call 1-800-QUIT-NOW (727)401-7056) OR Espaol: 1-855-Djelo-Ya (5-631-497-0263) o para ms informacin haga clic aqu or Text READY to 200-400 to register via text  Ms. Hutt,   Please see education materials related to high cholesterol provided as Advertising account planner.   The patient verbalized understanding of instructions, educational materials, and care plan provided today and agreed to receive a mailed copy of patient instructions, educational materials, and care plan.   Telephone follow up appointment with Managed Medicaid care management team member scheduled for:09/28/21 @ 11:15am  Lurena Joiner RN, BSN [place] RN Care Coordinator   Following is a copy of your plan  of care:  Care Plan : RN Care Manager Plan of Care  Updates made by Melissa Montane, RN since 08/23/2021 12:00 AM     Problem: Disease management needs related to DMII, HTN and CAD      Long-Range Goal: Development of Plan of Care for needs related to DMII, HTN and CAD   Start Date: 10/27/2020  Expected End Date: 10/16/2021  Priority: High  Note:   Current Barriers:  Chronic Disease Management support and education needs related to CAD, HTN, and DMII-Ms. Lahti had follow up with PCP on 08/22/21 and Ophthalmology on 08/03/21.  Film/video editor.   RNCM Clinical Goal(s):  Patient will verbalize understanding of plan for management of CAD, HTN, and DMII work with community resource care guide to address needs related to Financial constraints related to housing and food insecurities    through collaboration with Consulting civil engineer, provider, and care team.  Work with MM Pharmacist for medication management-Met  Interventions: Inter-disciplinary care team collaboration (see longitudinal plan of care) Evaluation of current treatment plan related to  self management and patient's adherence to plan as established by provider Advised patient to contact [facility] member services for enhanced member benefits(wellness benefit for vitamins and fresh fruit/vegetables)   Hyperlipidemia Interventions:  (Status:  New goal.) Long Term Goal Medication review performed; medication list updated in electronic medical record.  Provider established cholesterol goals reviewed Counseled on importance of regular laboratory monitoring as prescribed Provided HLD educational materials Reviewed importance of limiting foods high in cholesterol Collaborated with PCP regarding patient only taking rosuvastatin every other day   Diabetes:  (Status: Goal on track: NO.) Lab Results  Component Value Date   HGBA1C 7.1 (A) 08/22/2021  Assessed  patient's understanding of A1c goal: <7% Reviewed medications with patient and  discussed importance of medication adherence;        Discussed plans with patient for ongoing care management follow up and provided patient with direct contact information for care management team;      Reviewed scheduled/upcoming provider appointments including: 09/06/21 with [facility]           Review of patient status, including review of consultants reports, relevant laboratory and other test results, and medications completed;       Discussed diet and exercise Provided education on diabetic diet   Hypertension: (Status: Goal on track: NO.) Last practice recorded BP readings:  BP Readings from Last 3 Encounters:  08/22/21 (!) 150/91  06/12/21 124/86  12/08/20 134/78  Most recent eGFR/CrCl:  Lab Results  Component Value Date   EGFR 75 08/22/2021    No components found for: CRCL  Evaluation of current treatment plan related to hypertension self management and patient's adherence to plan as established by provider;   Reviewed medications with patient and discussed importance of compliance and recent medication changes;  Reviewed scheduled/upcoming provider appointments including: 09/06/21 with [facility] Assessed social determinant of health barriers;  Diet and exercise reviewed Provided education on heart healthy diet  Patient Goals/Self-Care Activities: Patient will self administer medications as prescribed Patient will attend all scheduled provider appointments Patient will call pharmacy for medication refills Patient will call provider office for new concerns or questions Patient will work with Care Guide for financial and food resources Patient will work with MM Pharmacist for medication management

## 2021-08-23 NOTE — Progress Notes (Signed)
Let patient know that her blood cell count is in the low normal range.  Continue iron supplement.   Kidney and liver function tests are good.   Cholesterol level is 109 with goal being less than 70.  Please confirm that she has been taking the rosuvastatin 10 mg daily consistently.  If she has been taking it consistently, then I recommend that we increase the dose to 20 mg daily to help get the cholesterol at goal.  Please let me know her response so that I know whether to send an updated prescription to her pharmacy.

## 2021-08-23 NOTE — Patient Outreach (Signed)
Medicaid Managed Care   Nurse Care Manager Note  08/23/2021 Name:  Lisa Olson MRN:  [id number] DOB:  10-17-1960  Lisa Olson is an 61 y.o. year old female who is a primary patient of Lisa Pier, MD.  [facility] Managed Care Coordination team was consulted for assistance with:    HTN HLD DMII  Lisa Olson was given information about Medicaid Managed Care Coordination team services today. Lisa Olson Patient agreed to services and verbal consent obtained.  Engaged with patient by telephone for follow up visit in response to provider referral for case management and/or care coordination services.   Assessments/Interventions:  Review of past medical history, allergies, medications, health status, including review of consultants reports, laboratory and other test data, was performed as part of comprehensive evaluation and provision of chronic care management services.  SDOH (Social Determinants of Health) assessments and interventions performed:   Care Plan  No Known Allergies  Medications Reviewed Today     Reviewed by Lisa Montane, RN (Registered Nurse) on 08/23/21 at 1146  Med List Status: <None>   Medication Order Taking? Sig Documenting Provider Last Dose Status Informant  Accu-Chek FastClix Lancets MISC [id number] Yes Use to check blood sugar once daily. E11.69 Lisa Pier, MD Taking Active   amoxicillin-clavulanate (AUGMENTIN) 500-125 MG tablet [id number]  Take 1 tablet (500 mg total) by mouth 2 (two) times daily with a meal. Lisa Pier, MD  Active            Med Note (Ardelia Wrede A   Wed Aug 23, 2021 11:38 AM) Needs to pick up prescription  Ascorbic Acid (VITAMIN C ER PO) [id number] Yes Take by mouth. [provider] Taking Active   Blood Glucose Monitoring Suppl (ACCU-CHEK GUIDE ME) w/Device KIT [id number] Yes Use to check blood sugar once daily. E11.69 Lisa Pier, MD Taking Active   carvedilol (COREG) 6.25 MG tablet [id number] Yes Take 1 tablet (6.25 mg  total) by mouth 2 (two) times daily with a meal. Lisa Pier, MD Taking Active   Difluprednate (DUREZOL) 0.05 % EMUL [id number] No Place 1 drop into the right eye daily.  Patient not taking: Reported on 08/23/2021   [name], MD Not Taking Active   DUREZOL 0.05 % EMUL [id number] No Place 1 drop into the right eye 2 (two) times daily.  Patient not taking: Reported on 08/23/2021   [provider] Not Taking Active            Med Note (Nakai Yard A   Thu Oct 27, 2020 12:54 PM) Once daily  ferrous sulfate 325 (65 FE) MG tablet [id number] Yes Take 1 tablet (325 mg total) by mouth daily with breakfast. Lisa Pier, MD Taking Active            Med Note [name], Adolphus Hanf A   Thu Oct 27, 2020 12:58 PM) Taking at night  furosemide (LASIX) 20 MG tablet [id number] Yes Take 1 tablet (20 mg total) by mouth daily. Stop Hydrochlorothiazide Lisa Pier, MD Taking Active   glimepiride (AMARYL) 4 MG tablet [id number] Yes Take 1 tablet (4 mg total) by mouth 2 (two) times daily. Lisa Pier, MD Taking Active   glucose blood test strip [id number] Yes USE 1 STRIP TO CHECK GLUCOSE THREE TIMES DAILY AS DIRECTED Lisa Pier, MD Taking Active   isosorbide mononitrate (IMDUR) 30 MG 24 hr tablet [id number] Yes Take 1 tablet (30 mg total) by mouth daily. Lisa Pier, MD  Taking Active   latanoprost (XALATAN) 0.005 % ophthalmic solution [id number]  1 drop at bedtime. [provider]  Consider Medication Status and Discontinue (Change in therapy)   latanoprost (XALATAN) 0.005 % ophthalmic solution [id number] Yes Place 1 drop into the left eye at bedtime. [name], MD Taking Active   lisinopril (ZESTRIL) 30 MG tablet [id number] Yes Take 1 tablet (30 mg total) by mouth daily. Lisa Pier, MD Taking Active   metFORMIN (GLUCOPHAGE) 1000 MG tablet [id number] Yes Take 1 tablet (1,000 mg total) by mouth 2 (two) times daily with a meal. Lisa Pier, MD Taking Active    Multiple Vitamins-Minerals (MULTIVITAMIN WITH MINERALS) tablet [id number] Yes Take 1 tablet by mouth daily. [provider] Taking Active   prednisoLONE acetate (PRED FORTE) 1 % ophthalmic suspension [id number] Yes Place 1 drop into the right eye daily. [name], MD Taking Active   rosuvastatin (CRESTOR) 10 MG tablet [id number] Yes Take 1 tablet (10 mg total) by mouth daily. Lisa Pier, MD Taking Active            Med Note (Maritza Hosterman A   Mon Jun 19, 2021 11:40 AM) Taking everyother day  timolol (TIMOPTIC) 0.5 % ophthalmic solution [id number]  1 drop every morning. [provider]  Consider Medication Status and Discontinue (Change in therapy)            Med Note (Toye Rouillard A   Thu Oct 27, 2020 12:55 PM) Using twice daily  timolol (TIMOPTIC) 0.5 % ophthalmic solution [id number] Yes Place 1 drop into both eyes 2 (two) times daily. [name], MD Taking Active             Patient Active Problem List   Diagnosis Date Noted   Hair thinning 10/28/2020   H/O retinal detachment 06/30/2020   Primary open angle glaucoma (POAG) of both eyes, indeterminate stage 06/30/2020   Coronary artery disease involving native coronary artery of native heart without angina pectoris 04/14/2020   Hyperlipidemia associated with type 2 diabetes mellitus ([place]) 04/14/2020   Pain due to onychomycosis of toenails of both feet 09/16/2019   Diabetes mellitus without complication ([place]) 53/29/9242   Influenza vaccine refused 11/27/2018   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, associated with type 2 diabetes mellitus ([place]) 05/22/2018   Nuclear sclerotic cataract of both eyes 02/11/2018   Retinal edema 02/11/2018   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 02/11/2018   Vitreous hemorrhage, right eye ([place]) 02/11/2018   Legal blindness 07/25/2017   Type 2 diabetes mellitus with retinopathy of  both eyes, with long-term current use of insulin ([place]) 02/14/2016   Essential hypertension 02/14/2016   Disorder of skin or subcutaneous tissue 04/24/2013   Onychomycosis due to dermatophyte 03/25/2013   Hallux valgus, acquired 03/25/2013    Conditions to be addressed/monitored per PCP order:  HTN, HLD, and DMII  Care Plan : RN Care Manager Plan of Care  Updates made by Lisa Montane, RN since 08/23/2021 12:00 AM     Problem: Disease management needs related to DMII, HTN and CAD      Long-Range Goal: Development of Plan of Care for needs related to DMII, HTN and CAD   Start Date: 10/27/2020  Expected End Date: 10/16/2021  Priority: High  Note:   Current Barriers:  Chronic Disease Management support and education needs related to CAD, HTN, and DMII-Ms. Hammar had follow up with PCP on 08/22/21 and  Ophthalmology on 08/03/21.  Film/video editor.   RNCM Clinical Goal(s):  Patient will verbalize understanding of plan for management of CAD, HTN, and DMII work with community resource care guide to address needs related to Financial constraints related to housing and food insecurities    through collaboration with Consulting civil engineer, provider, and care team.  Work with MM Pharmacist for medication management-Met  Interventions: Inter-disciplinary care team collaboration (see longitudinal plan of care) Evaluation of current treatment plan related to  self management and patient's adherence to plan as established by provider Advised patient to contact [facility] member services for enhanced member benefits(wellness benefit for vitamins and fresh fruit/vegetables)   Hyperlipidemia Interventions:  (Status:  New goal.) Long Term Goal Medication review performed; medication list updated in electronic medical record.  Provider established cholesterol goals reviewed Counseled on importance of regular laboratory monitoring as prescribed Provided HLD educational materials Reviewed importance of limiting  foods high in cholesterol Collaborated with PCP regarding patient only taking rosuvastatin every other day   Diabetes:  (Status: Goal on track: NO.) Lab Results  Component Value Date   HGBA1C 7.1 (A) 08/22/2021  Assessed patient's understanding of A1c goal: <7% Reviewed medications with patient and discussed importance of medication adherence;        Discussed plans with patient for ongoing care management follow up and provided patient with direct contact information for care management team;      Reviewed scheduled/upcoming provider appointments including: 09/06/21 with [facility]           Review of patient status, including review of consultants reports, relevant laboratory and other test results, and medications completed;       Discussed diet and exercise Provided education on diabetic diet   Hypertension: (Status: Goal on track: NO.) Last practice recorded BP readings:  BP Readings from Last 3 Encounters:  08/22/21 (!) 150/91  06/12/21 124/86  12/08/20 134/78  Most recent eGFR/CrCl:  Lab Results  Component Value Date   EGFR 75 08/22/2021    No components found for: CRCL  Evaluation of current treatment plan related to hypertension self management and patient's adherence to plan as established by provider;   Reviewed medications with patient and discussed importance of compliance and recent medication changes;  Reviewed scheduled/upcoming provider appointments including: 09/06/21 with [facility] Assessed social determinant of health barriers;  Diet and exercise reviewed Provided education on heart healthy diet  Patient Goals/Self-Care Activities: Patient will self administer medications as prescribed Patient will attend all scheduled provider appointments Patient will call pharmacy for medication refills Patient will call provider office for new concerns or questions Patient will work with Care Guide for financial and food resources Patient will work with MM Pharmacist for medication  management       Follow Up:  Patient agrees to Care Plan and Follow-up.  Plan: The Managed Medicaid care management team will reach out to the patient again over the next 30 days.  Date/time of next scheduled RN care management/care coordination outreach:  09/28/21 @ 11:15am  Lurena Joiner RN, BSN [place]  [employer] manager

## 2021-08-31 ENCOUNTER — Telehealth: Payer: Self-pay

## 2021-08-31 NOTE — Telephone Encounter (Signed)
Pt was called and informed of lab results. She states that her cardiologist informe her not to take the medication daily, so patient is taking the medication every other day.

## 2021-09-06 ENCOUNTER — Encounter: Payer: Self-pay | Admitting: Podiatry

## 2021-09-06 ENCOUNTER — Telehealth: Payer: Self-pay | Admitting: Internal Medicine

## 2021-09-06 ENCOUNTER — Ambulatory Visit (INDEPENDENT_AMBULATORY_CARE_PROVIDER_SITE_OTHER): Payer: Medicaid Other | Admitting: Podiatry

## 2021-09-06 DIAGNOSIS — M79675 Pain in left toe(s): Secondary | ICD-10-CM

## 2021-09-06 DIAGNOSIS — M79674 Pain in right toe(s): Secondary | ICD-10-CM

## 2021-09-06 DIAGNOSIS — B351 Tinea unguium: Secondary | ICD-10-CM

## 2021-09-06 DIAGNOSIS — E119 Type 2 diabetes mellitus without complications: Secondary | ICD-10-CM

## 2021-09-06 NOTE — Progress Notes (Signed)
This patient returns to my office for at risk foot care.  This patient requires this care by a professional since this patient will be at risk due to having type 2 diabetes.    This patient is unable to cut nails herself since the patient cannot reach her nails.These nails are painful walking and wearing shoes.  This patient presents for at risk foot care today.  General Appearance  Alert, conversant and in no acute stress.  Vascular  Dorsalis pedis and posterior tibial  pulses are  Weakly palpable  bilaterally.  Capillary return is within normal limits  bilaterally. Temperature is within normal limits  bilaterally.  Neurologic  Senn-Weinstein monofilament wire test within normal limits  bilaterally. Muscle power within normal limits bilaterally.  Nails Thick disfigured discolored nails with subungual debris  from hallux to fifth toes bilaterally. No evidence of bacterial infection or drainage bilaterally.  Orthopedic  No limitations of motion  feet .  No crepitus or effusions noted.  No bony pathology or digital deformities noted. Mild  HAV  B/L.  Skin  normotropic skin with no porokeratosis noted bilaterally.  No signs of infections or ulcers noted.     Onychomycosis  Pain in right toes  Pain in left toes  Consent was obtained for treatment procedures.   Mechanical debridement of nails 1-5  bilaterally performed with a nail nipper.  Filed with dremel without incident.    Return office visit  3 months                    Told patient to return for periodic foot care and evaluation due to potential at risk complications.   Yanina Knupp DPM  

## 2021-09-06 NOTE — Telephone Encounter (Signed)
Copied from CRM [id number]. Topic: General - Other >> Sep 06, 2021  3:40 PM Lynford Citizen wrote: Reason for CRM: Pts pharmacy called in to see if Dr Laural Benes could re- send in an Rx for the "accu check guide", pharmacist stated the other Rx is not covered by the pts insurance, please advise.

## 2021-09-07 ENCOUNTER — Inpatient Hospital Stay: Admission: RE | Admit: 2021-09-07 | Payer: Medicaid Other | Source: Ambulatory Visit

## 2021-09-08 ENCOUNTER — Other Ambulatory Visit: Payer: Self-pay

## 2021-09-08 MED ORDER — ACCU-CHEK GUIDE W/DEVICE KIT
PACK | 0 refills | Status: DC
  Filled 2021-09-08: qty 1, 30d supply, fill #0

## 2021-09-08 MED ORDER — ACCU-CHEK SOFTCLIX LANCETS MISC
12 refills | Status: AC
  Filled 2021-09-08: qty 100, 30d supply, fill #0

## 2021-09-08 MED ORDER — ACCU-CHEK GUIDE VI STRP
ORAL_STRIP | 12 refills | Status: DC
  Filled 2021-09-08: qty 100, 30d supply, fill #0

## 2021-09-12 ENCOUNTER — Other Ambulatory Visit: Payer: Self-pay

## 2021-09-12 ENCOUNTER — Ambulatory Visit
Admission: RE | Admit: 2021-09-12 | Discharge: 2021-09-12 | Disposition: A | Discharge status: Home / Self Care | Payer: Medicaid Other | Source: Ambulatory Visit | Attending: Internal Medicine | Admitting: Internal Medicine

## 2021-09-12 DIAGNOSIS — Z1231 Encounter for screening mammogram for malignant neoplasm of breast: Secondary | ICD-10-CM

## 2021-09-14 ENCOUNTER — Other Ambulatory Visit: Payer: Self-pay

## 2021-09-28 ENCOUNTER — Other Ambulatory Visit: Payer: Self-pay | Admitting: *Deleted

## 2021-09-28 NOTE — Patient Outreach (Signed)
Medicaid Managed Care   Nurse Care Manager Note  09/28/2021 Name:  Lisa Olson MRN:  [id number] DOB:  [birth year]  Lisa Olson is an 61 y.o. year old female who is a primary patient of [name], MD.  The [facility] Managed Care Coordination team was consulted for assistance with:    HTN HLD DMII  Ms. Neidhardt was given information about Medicaid Managed Care Coordination team services today. [name] Patient agreed to services and verbal consent obtained.  Engaged with patient by telephone for follow up visit in response to provider referral for case management and/or care coordination services.   Assessments/Interventions:  Review of past medical history, allergies, medications, health status, including review of consultants reports, laboratory and other test data, was performed as part of comprehensive evaluation and provision of chronic care management services.  SDOH (Social Determinants of Health) assessments and interventions performed:   Care Plan  No Known Allergies  Medications Reviewed Today     Reviewed by Melissa Montane, RN (Registered Nurse) on 09/28/21 at 1132  Med List Status: <None>   Medication Order Taking? Sig Documenting Provider Last Dose Status Informant  Accu-Chek Softclix Lancets lancets [id number]  Use as instructed 3 times a day  before meals Charlott Rakes, MD  Active   amoxicillin-clavulanate (AUGMENTIN) 500-125 MG tablet [id number]  Take 1 tablet (500 mg total) by mouth 2 (two) times daily with a meal. [name], MD  Active            Med Note (Adrin Julian A   Wed Aug 23, 2021 11:38 AM) Needs to pick up prescription  Ascorbic Acid (VITAMIN C ER PO) [id number] No Take by mouth. [provider] Taking Active   Blood Glucose Monitoring Suppl (ACCU-CHEK GUIDE ME) w/Device KIT [id number] No Use to check blood sugar once daily. E11.69 [name], MD Taking Active   Blood Glucose Monitoring Suppl (ACCU-CHEK GUIDE) w/Device KIT [id number]  Use  as directed 3 times a day Charlott Rakes, MD  Active   carvedilol (COREG) 6.25 MG tablet [id number] No Take 1 tablet (6.25 mg total) by mouth 2 (two) times daily with a meal. [name], MD Taking Active   Difluprednate (DUREZOL) 0.05 % EMUL [id number] No Place 1 drop into the right eye daily.  Patient not taking: Reported on 08/23/2021   [name], MD Not Taking Active   DUREZOL 0.05 % EMUL [id number] No Place 1 drop into the right eye 2 (two) times daily.  Patient not taking: Reported on 08/23/2021   [provider] Not Taking Active            Med Note ([name] A   Thu Oct 27, 2020 12:54 PM) Once daily  ferrous sulfate 325 (65 FE) MG tablet [id number] No Take 1 tablet (325 mg total) by mouth daily with breakfast. [name], MD Taking Active            Med Note [name], [name]   Thu Oct 27, 2020 12:58 PM) Taking at night  furosemide (LASIX) 20 MG tablet [id number] No Take 1 tablet (20 mg total) by mouth daily. Stop Hydrochlorothiazide [name], MD Taking Active   glimepiride (AMARYL) 4 MG tablet [id number] No Take 1 tablet (4 mg total) by mouth 2 (two) times daily. [name], MD Taking Active   glucose blood (ACCU-CHEK GUIDE) test strip [id number]  Use as instructed 3 times a day Charlott Rakes, MD  Active   isosorbide  mononitrate (IMDUR) 30 MG 24 hr tablet [id number] No Take 1 tablet (30 mg total) by mouth daily. [name], MD Taking Active   latanoprost (XALATAN) 0.005 % ophthalmic solution [id number] No 1 drop at bedtime. [provider] Taking Active   latanoprost (XALATAN) 0.005 % ophthalmic solution [id number] No Place 1 drop into the left eye at bedtime. [name], MD Taking Active   lisinopril (ZESTRIL) 30 MG tablet [id number] No Take 1 tablet (30 mg total) by mouth daily. [name], MD Taking Active   metFORMIN (GLUCOPHAGE) 1000 MG tablet [id number] No Take 1 tablet (1,000 mg total) by mouth 2 (two) times daily  with a meal. [name], MD Taking Active   Multiple Vitamins-Minerals (MULTIVITAMIN WITH MINERALS) tablet [id number] No Take 1 tablet by mouth daily. [provider] Taking Active   prednisoLONE acetate (PRED FORTE) 1 % ophthalmic suspension [id number] No Place 1 drop into the right eye daily. [name], MD Taking Active   rosuvastatin (CRESTOR) 10 MG tablet [id number] No Take 1 tablet (10 mg total) by mouth daily. [name], MD Taking Active            Med Note ([name], [name]   Mon Jun 19, 2021 11:40 AM) Taking everyother day  timolol (TIMOPTIC) 0.5 % ophthalmic solution [id number] No 1 drop every morning. [provider] Taking Active            Med Note ([name], [name]   Thu Oct 27, 2020 12:55 PM) Using twice daily  timolol (TIMOPTIC) 0.5 % ophthalmic solution [id number] No Place 1 drop into both eyes 2 (two) times daily. [name], MD Taking Active             Patient Active Problem List   Diagnosis Date Noted   Hair thinning 10/28/2020   H/O retinal detachment 06/30/2020   Primary open angle glaucoma (POAG) of both eyes, indeterminate stage 06/30/2020   Coronary artery disease involving native coronary artery of native heart without angina pectoris 04/14/2020   Hyperlipidemia associated with type 2 diabetes mellitus ([place]) 04/14/2020   Pain due to onychomycosis of toenails of both feet 09/16/2019   Diabetes mellitus without complication ([place]) 93/23/5573   Influenza vaccine refused 11/27/2018   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, associated with type 2 diabetes mellitus ([place]) 05/22/2018   Nuclear sclerotic cataract of both eyes 02/11/2018   Retinal edema 02/11/2018   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 02/11/2018   Vitreous hemorrhage, right eye ([place]) 02/11/2018   Legal blindness 07/25/2017   Type 2 diabetes mellitus with retinopathy of  both eyes, with long-term current use of insulin ([place]) 02/14/2016   Essential hypertension 02/14/2016   Disorder of skin or subcutaneous tissue 04/24/2013   Onychomycosis due to dermatophyte 03/25/2013   Hallux valgus, acquired 03/25/2013    Conditions to be addressed/monitored per PCP order:  HTN, HLD, and DMII  Care Plan : RN Care Manager Plan of Care  Updates made by Melissa Montane, RN since 09/28/2021 12:00 AM     Problem: Disease management needs related to DMII, HTN and CAD      Long-Range Goal: Development of Plan of Care for needs related to DMII, HTN and CAD   Start Date: 10/27/2020  Expected End Date: 11/28/2021  Priority: High  Note:   Current Barriers:  Chronic Disease Management support and education needs related to CAD, HTN, and DMII-Ms. Hassell Done  reports doing well today, she is trying to sign up for fresh fruit and vegetable benefit offered by [employer] today and only had time for a brief conversation. She reports having all of her medications and denies any needs today.  Film/video editor.   RNCM Clinical Goal(s):  Patient will verbalize understanding of plan for management of CAD, HTN, and DMII work with community resource care guide to address needs related to Financial constraints related to housing and food insecurities    through collaboration with Consulting civil engineer, provider, and care team.  Work with MM Pharmacist for medication management-Met  Interventions: Inter-disciplinary care team collaboration (see longitudinal plan of care) Evaluation of current treatment plan related to  self management and patient's adherence to plan as established by provider   Hyperlipidemia Interventions:  (Status:  Condition stable.  Not addressed this visit.) Long Term Goal Medication review performed; medication list updated in electronic medical record.  Provider established cholesterol goals reviewed Counseled on importance of regular laboratory monitoring as prescribed Provided  HLD educational materials Reviewed importance of limiting foods high in cholesterol   Diabetes:  (Status: Goal on Track (progressing): YES.) Lab Results  Component Value Date   HGBA1C 7.1 (A) 08/22/2021  Assessed patient's understanding of A1c goal: <7% Reviewed medications with patient and discussed importance of medication adherence;        Discussed plans with patient for ongoing care management follow up and provided patient with direct contact information for care management team;      Reviewed scheduled/upcoming provider appointments including: scheduling follow up with PCP for PAP and 12/06/21 for diabetic foot care          Review of patient status, including review of consultants reports, relevant laboratory and other test results, and medications completed;       Discussed diet and exercise Provided education on diabetes   Hypertension: (Status: Goal on Track (progressing): YES.) Last practice recorded BP readings:  BP Readings from Last 3 Encounters:  08/22/21 (!) 150/91  06/12/21 124/86  12/08/20 134/78  Most recent eGFR/CrCl:  Lab Results  Component Value Date   EGFR 75 08/22/2021    No components found for: "CRCL"  Evaluation of current treatment plan related to hypertension self management and patient's adherence to plan as established by provider;   Reviewed medications with patient and discussed importance of compliance and recent medication changes;  Reviewed scheduled/upcoming provider appointments including: 12/06/21 with [facility] Assessed social determinant of health barriers;  Diet and exercise reviewed Provided education on managing HTN  Patient Goals/Self-Care Activities: Patient will self administer medications as prescribed Patient will attend all scheduled provider appointments Patient will call pharmacy for medication refills Patient will call provider office for new concerns or questions Patient will work with Care Guide for financial and food  resources Patient will work with MM Pharmacist for medication management       Follow Up:  Patient agrees to Care Plan and Follow-up.  Plan: The Managed Medicaid care management team will reach out to the patient again over the next 60 days.  Date/time of next scheduled RN care management/care coordination outreach:  11/28/21 @ 10:30am  Lurena Joiner RN, BSN [place] RN Care Coordinator

## 2021-09-28 NOTE — Patient Instructions (Signed)
Visit Information  Lisa Olson was given information about Medicaid Managed Care team care coordination services as a part of their [place] Medicaid benefit. Lisa Olson verbally consented to engagement with the [facility] Managed Care team.   If you are experiencing a medical emergency, please call 911 or report to your local emergency department or urgent care.   If you have a non-emergency medical problem during routine business hours, please contact your provider's office and ask to speak with a nurse.   For questions related to your [facility], please call: [phone] or visit the homepage here: [email or web address]  If you would like to schedule transportation through your [facility], please call the following number at least 2 days in advance of your appointment: [phone]   Rides for urgent appointments can also be made after hours by calling Member Services.  Call the [place] at [phone], at any time, 24 hours a day, 7 days a week. If you are in danger or need immediate medical attention call 911.  If you would like help to quit smoking, call 1-800-QUIT-NOW 928-472-1974) OR Espaol: 1-855-Djelo-Ya (9-935-701-7793) o para ms informacin haga clic aqu or Text READY to 200-400 to register via text  Lisa Olson,   Please see education materials related to HTN and cholesterol provided as print materials.   The patient verbalized understanding of instructions, educational materials, and care plan provided today and agreed to receive a mailed copy of patient instructions, educational materials, and care plan.   Telephone follow up appointment with Managed Medicaid care management team member scheduled for:11/28/21 @ 10:30am  Lurena Joiner RN, BSN [place] RN Care Coordinator   Following is a copy of your  plan of care:  Care Plan : RN Care Manager Plan of Care  Updates made by Melissa Montane, RN since 09/28/2021 12:00 AM     Problem: Disease management needs related to DMII, HTN and CAD      Long-Range Goal: Development of Plan of Care for needs related to DMII, HTN and CAD   Start Date: 10/27/2020  Expected End Date: 11/28/2021  Priority: High  Note:   Current Barriers:  Chronic Disease Management support and education needs related to CAD, HTN, and DMII-Lisa Olson reports doing well today, she is trying to sign up for fresh fruit and vegetable benefit offered by [facility] today and only had time for a brief conversation. She reports having all of her medications and denies any needs today.  Film/video editor.   RNCM Clinical Goal(s):  Patient will verbalize understanding of plan for management of CAD, HTN, and DMII work with community resource care guide to address needs related to Financial constraints related to housing and food insecurities    through collaboration with Consulting civil engineer, provider, and care team.  Work with MM Pharmacist for medication management-Met  Interventions: Inter-disciplinary care team collaboration (see longitudinal plan of care) Evaluation of current treatment plan related to  self management and patient's adherence to plan as established by provider   Hyperlipidemia Interventions:  (Status:  Condition stable.  Not addressed this visit.) Long Term Goal Medication review performed; medication list updated in electronic medical record.  Provider established cholesterol goals reviewed Counseled on importance of regular laboratory monitoring as prescribed Provided HLD educational materials Reviewed importance of limiting foods high in cholesterol   Diabetes:  (Status: Goal on Track (progressing): YES.) Lab Results  Component Value  Date   HGBA1C 7.1 (A) 08/22/2021  Assessed patient's understanding of A1c goal: <7% Reviewed medications with patient and  discussed importance of medication adherence;        Discussed plans with patient for ongoing care management follow up and provided patient with direct contact information for care management team;      Reviewed scheduled/upcoming provider appointments including: scheduling follow up with PCP for PAP and 12/06/21 for diabetic foot care          Review of patient status, including review of consultants reports, relevant laboratory and other test results, and medications completed;       Discussed diet and exercise Provided education on diabetes   Hypertension: (Status: Goal on Track (progressing): YES.) Last practice recorded BP readings:  BP Readings from Last 3 Encounters:  08/22/21 (!) 150/91  06/12/21 124/86  12/08/20 134/78  Most recent eGFR/CrCl:  Lab Results  Component Value Date   EGFR 75 08/22/2021    No components found for: "CRCL"  Evaluation of current treatment plan related to hypertension self management and patient's adherence to plan as established by provider;   Reviewed medications with patient and discussed importance of compliance and recent medication changes;  Reviewed scheduled/upcoming provider appointments including: 12/06/21 with [facility] Assessed social determinant of health barriers;  Diet and exercise reviewed Provided education on managing HTN  Patient Goals/Self-Care Activities: Patient will self administer medications as prescribed Patient will attend all scheduled provider appointments Patient will call pharmacy for medication refills Patient will call provider office for new concerns or questions Patient will work with Care Guide for financial and food resources Patient will work with MM Pharmacist for medication management

## 2021-11-02 ENCOUNTER — Other Ambulatory Visit: Payer: Self-pay

## 2021-11-28 ENCOUNTER — Other Ambulatory Visit: Payer: Self-pay | Admitting: *Deleted

## 2021-11-28 NOTE — Patient Instructions (Signed)
Visit Information  Ms. Weslyn Mousel  - as a part of your Medicaid benefit, you are eligible for care management and care coordination services at no cost or copay. I was unable to reach you by phone today but would be happy to help you with your health related needs. Please feel free to call me @  [phone].   A member of the Managed Medicaid care management team will reach out to you again over the next 14 days.   Valaree Fresquez RN, BSN [place]  [facility] RN Care Coordinator   

## 2021-11-28 NOTE — Patient Outreach (Signed)
  Medicaid Managed Care   Unsuccessful Attempt Note   11/28/2021 Name: Lisa Olson MRN: [id number] DOB: 1961/01/30  Referred by: Marcine Matar, MD Reason for referral : High Risk Managed Medicaid (Unsuccessful RNCM follow up telephone outreach)   An unsuccessful telephone outreach was attempted today. The patient was referred to the case management team for assistance with care management and care coordination.    Follow Up Plan: A HIPAA compliant phone message was left for the patient providing contact information and requesting a return call.    Estanislado Emms RN, BSN [place]  [facility]

## 2021-11-29 ENCOUNTER — Other Ambulatory Visit: Payer: Self-pay

## 2021-11-29 NOTE — Progress Notes (Signed)
Patient appearing on report for [employer] [facility] - Hypertension Control report due to last documented ambulatory blood pressure of 161/97 mmHg on 08/22/2021. Next appointment with [facility] is 01/10/2022.   Outreached patient to discuss hypertension control and medication management.   Current antihypertensives: carvedilol 6.25 mg BID, furosemide 20 mg daily, Imdur 30 mg daily, lisinopril 30 mg daily  Patient has an automated upper arm home BP machine. However, she is unable to set it up.   Current blood pressure readings: not checking but has BP machine  Patient denies hypotensive signs and symptoms including dizziness, lightheadedness.  Patient denies hypertensive symptoms including headache, chest pain, shortness of breath.  Patient denies side effects related to BP medications.    Assessment/Plan: - Currently uncontrolled - Reviewed goal blood pressure <130/80 - Reviewed appropriate administration of medication regimen -Reminded patient to take all BP medications before her f/up appointment in October.   Valeda Malm, Pharm.D. PGY-2 Ambulatory Care Pharmacy Resident 11/29/2021 10:30 AM

## 2021-12-01 ENCOUNTER — Other Ambulatory Visit: Payer: Self-pay | Admitting: *Deleted

## 2021-12-01 NOTE — Patient Instructions (Signed)
Visit Information  [name]  - as a part of your Medicaid benefit, you are eligible for care management and care coordination services at no cost or copay. I was unable to reach you by phone today but would be happy to help you with your health related needs. Please feel free to call me @  [phone].   A member of the Managed Medicaid care management team will reach out to you again over the next 14 days.   Kitty Cadavid RN, BSN [place]  [facility] RN Care Coordinator   

## 2021-12-01 NOTE — Patient Outreach (Signed)
  Medicaid Managed Care   Unsuccessful Attempt Note   12/01/2021 Name: Lisa Olson MRN: [id number] DOB: 07/17/1960  Referred by: Marcine Matar, MD Reason for referral : High Risk Managed Medicaid (Unsuccessful RNCM follow up outreach, second attempt)   A second unsuccessful telephone outreach was attempted today. The patient was referred to the case management team for assistance with care management and care coordination.    Follow Up Plan: A HIPAA compliant phone message was left for the patient providing contact information and requesting a return call.    Estanislado Emms RN, BSN [place]  [facility]

## 2021-12-04 ENCOUNTER — Telehealth: Payer: Self-pay | Admitting: Internal Medicine

## 2021-12-04 NOTE — Telephone Encounter (Signed)
..   Medicaid Managed Care   Unsuccessful Outreach Note  12/04/2021 Name: Lisa Olson MRN: [id number] DOB: 05-15-1960  Referred by: [name], MD Reason for referral : High Risk Managed Medicaid (I called the patient today to get her rescheduled with the MM RNCM. I left my name and number on her VM.)   Third unsuccessful telephone outreach was attempted today. The patient was referred to the case management team for assistance with care management and care coordination. The patient's primary care provider has been notified of our unsuccessful attempts to make or maintain contact with the patient. The care management team is pleased to engage with this patient at any time in the future should he/she be interested in assistance from the care management team.   Follow Up Plan: We have been unable to make contact with the patient for follow up. The care management team is available to follow up with the patient after provider conversation with the patient regarding recommendation for care management engagement and subsequent re-referral to the care management team.   [place], [place]

## 2021-12-06 ENCOUNTER — Ambulatory Visit: Payer: Medicaid Other | Admitting: Podiatry

## 2021-12-13 ENCOUNTER — Other Ambulatory Visit: Payer: Self-pay | Admitting: *Deleted

## 2021-12-13 NOTE — Patient Instructions (Signed)
Visit Information  [name] was given information about Medicaid Managed Care team care coordination services as a part of their [place] Medicaid benefit. Lisa Olson verbally consented to engagement with the [facility] Managed Care team.   If you are experiencing a medical emergency, please call 911 or report to your local emergency department or urgent care.   If you have a non-emergency medical problem during routine business hours, please contact your provider's office and ask to speak with a nurse.   For questions related to your [facility], please call: [phone] or visit the homepage here: [email or web address]  If you would like to schedule transportation through your [facility], please call the following number at least 2 days in advance of your appointment: [phone]   Rides for urgent appointments can also be made after hours by calling Member Services.  Call the [place] at [phone], at any time, 24 hours a day, 7 days a week. If you are in danger or need immediate medical attention call 911.  If you would like help to quit smoking, call 1-800-QUIT-NOW (445)576-8884) OR Espaol: 1-855-Djelo-Ya (1-950-932-6712) o para ms informacin haga clic aqu or Text READY to 200-400 to register via text  [name],   Please see education materials related to cholesterol and HTN provided as print materials.   The patient verbalized understanding of instructions, educational materials, and care plan provided today and agreed to receive a mailed copy of patient instructions, educational materials, and care plan.   Telephone follow up appointment with Managed Medicaid care management team member scheduled for:01/15/22 @ 2:30pm  Lisa Joiner RN, BSN [place] RN Care Coordinator   Following is a copy of your  plan of care:  Care Plan : RN Care Manager Plan of Care  Updates made by Lisa Montane, RN since 12/13/2021 12:00 AM     Problem: Disease management needs related to DMII, HTN and CAD      Long-Range Goal: Development of Plan of Care for needs related to DMII, HTN and CAD   Start Date: 10/27/2020  Expected End Date: 01/16/2022  Priority: High  Note:   Current Barriers:  Chronic Disease Management support and education needs related to CAD, HTN, and DMII-[name] reports doing well today. Reports having a broken BP monitor and is unable to read the display. She would like to have a BP monitor for visually impaired. She reports having all of her medications, is aware of upcoming appointments and denies any needs today.  Film/video editor.   RNCM Clinical Goal(s):  Patient will verbalize understanding of plan for management of CAD, HTN, and DMII work with community resource care guide to address needs related to Financial constraints related to housing and food insecurities    through collaboration with Consulting civil engineer, provider, and care team.  Work with MM Pharmacist for medication management-Met  Interventions: Inter-disciplinary care team collaboration (see longitudinal plan of care) Evaluation of current treatment plan related to  self management and patient's adherence to plan as established by provider Provided therapeutic listening   Hyperlipidemia Interventions:  (Status:  Goal on track:  Yes.) Long Term Goal Medication review performed; medication list updated in electronic medical record.  Provider established cholesterol goals reviewed Counseled on importance of regular laboratory monitoring as prescribed Reviewed role and benefits of statin for ASCVD risk reduction Reviewed importance of limiting foods high in cholesterol Reviewed exercise goals  and target of 150 minutes per week   Diabetes:  (Status: Goal on Track (progressing): YES.) Lab Results  Component Value  Date   HGBA1C 7.1 (A) 08/22/2021  Assessed patient's understanding of A1c goal: <7% Reviewed medications with patient and discussed importance of medication adherence;        Discussed plans with patient for ongoing care management follow up and provided patient with direct contact information for care management team;      Reviewed scheduled/upcoming provider appointments including: 01/08/22 for diabetic foot care and 01/10/22 with PCP           Review of patient status, including review of consultants reports, relevant laboratory and other test results, and medications completed;       Provided education on managing diabetes   Hypertension: (Status: Goal on Track (progressing): YES.) Last practice recorded BP readings:  BP Readings from Last 3 Encounters:  08/22/21 (!) 150/91  06/12/21 124/86  12/08/20 134/78  Most recent eGFR/CrCl:  Lab Results  Component Value Date   EGFR 75 08/22/2021    No components found for: "CRCL"  Evaluation of current treatment plan related to hypertension self management and patient's adherence to plan as established by provider;   Reviewed medications with patient and discussed importance of compliance and recent medication changes;  Reviewed scheduled/upcoming provider appointments including: 01/08/22 with [facility] and 01/10/22 with PCP Assessed social determinant of health barriers;  Advised patient to take all of her medications to her PCP appointment Collaborated with PCP for BP monitor for visually impaired  Patient Goals/Self-Care Activities: Patient will self administer medications as prescribed Patient will attend all scheduled provider appointments Patient will call pharmacy for medication refills Patient will call provider office for new concerns or questions Patient will work with Care Guide for financial and food resources Patient will work with MM Pharmacist for medication management

## 2021-12-13 NOTE — Patient Outreach (Addendum)
Medicaid Managed Care   Nurse Care Manager Note  12/13/2021 Name:  Lisa Olson MRN:  [id number] DOB:  12-25-1960  Lisa Olson is an 61 y.o. year old female who is a primary patient of [name], MD.  The [facility] Managed Care Coordination team was consulted for assistance with:    HTN HLD DMII  Ms. Hemberger was given information about Medicaid Managed Care Coordination team services today. [name] Patient agreed to services and verbal consent obtained.  Engaged with patient by telephone for follow up visit in response to provider referral for case management and/or care coordination services.   Assessments/Interventions:  Review of past medical history, allergies, medications, health status, including review of consultants reports, laboratory and other test data, was performed as part of comprehensive evaluation and provision of chronic care management services.  SDOH (Social Determinants of Health) assessments and interventions performed: SDOH Interventions    Flowsheet Row Patient Outreach Telephone from 06/19/2021 in [place] Patient Outreach Telephone from 04/12/2021 in [place] Patient Outreach Telephone from 12/07/2020 in [place] Patient Outreach Telephone from 11/08/2020 in [place] Patient Outreach Telephone from 10/27/2020 in [place] Interventions       Food Insecurity Interventions Patient Refused -- Other (Comment)  Engineer, maintenance Guide referral for assistance with food pantries] -- --  Housing Interventions -- Intervention Not Indicated -- -- Other (Comment)  [Referral for Care Guide for assistance with [place] 8]  Transportation Interventions Intervention Not Indicated -- --  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, [facility] for medical transportation, [facility] for  medication delivery] Other (Comment)  [Will get meds delivered since she told me, "I'm blind in one eye and have trouble driving"] Intervention Not Indicated  Financial Strain Interventions -- -- -- Other (Comment)  [Will get meds delivered at beginning of the month when she gets paid to ease burden of affording meds] --  Social Connections Interventions -- Intervention Not Indicated -- -- --       Care Plan  No Known Allergies  Medications Reviewed Today     Reviewed by Melissa Montane, RN (Registered Nurse) on 12/13/21 at 1438  Med List Status: <None>   Medication Order Taking? Sig Documenting Provider Last Dose Status Informant  Accu-Chek Softclix Lancets lancets [id number] Yes Use as instructed 3 times a day  before meals Charlott Rakes, MD Taking Active   Ascorbic Acid (VITAMIN C ER PO) [id number] Yes Take by mouth. [provider] Taking Active   Blood Glucose Monitoring Suppl (ACCU-CHEK GUIDE ME) w/Device KIT [id number] Yes Use to check blood sugar once daily. E11.69 [name], MD Taking Active   Blood Glucose Monitoring Suppl (ACCU-CHEK GUIDE) w/Device KIT [id number] Yes Use as directed 3 times a day Charlott Rakes, MD Taking Active   carvedilol (COREG) 6.25 MG tablet [id number] Yes Take 1 tablet (6.25 mg total) by mouth 2 (two) times daily with a meal. [name], MD Taking Active   Difluprednate (DUREZOL) 0.05 % EMUL [id number] No Place 1 drop into the right eye daily.  Patient not taking: Reported on 12/13/2021   [name], MD Not Taking Active            Med Note (Kymberley Raz A   Wed Dec 13, 2021  2:38 PM) On back order  DUREZOL 0.05 % EMUL [id number]  Place 1 drop into the right eye  2 (two) times daily. [provider]  Consider Medication Status and Discontinue (Change in therapy)            Med Note (Harlea Goetzinger A   Thu Oct 27, 2020 12:54 PM) Once daily  ferrous sulfate 325 (65 FE) MG tablet [id number] Yes Take 1 tablet (325 mg total) by  mouth daily with breakfast. [name], MD Taking Active            Med Note [name], Mylo Driskill A   Thu Oct 27, 2020 12:58 PM) Taking at night  furosemide (LASIX) 20 MG tablet [id number] Yes Take 1 tablet (20 mg total) by mouth daily. Stop Hydrochlorothiazide [name], MD Taking Active   glimepiride (AMARYL) 4 MG tablet [id number] Yes Take 1 tablet (4 mg total) by mouth 2 (two) times daily. [name], MD Taking Active   glucose blood (ACCU-CHEK GUIDE) test strip [id number] Yes Use as instructed 3 times a day Charlott Rakes, MD Taking Active   isosorbide mononitrate (IMDUR) 30 MG 24 hr tablet [id number] Yes Take 1 tablet (30 mg total) by mouth daily. [name], MD Taking Active   latanoprost (XALATAN) 0.005 % ophthalmic solution [id number] Yes Place 1 drop into the left eye at bedtime. [name], MD Taking Active   lisinopril (ZESTRIL) 30 MG tablet [id number] Yes Take 1 tablet (30 mg total) by mouth daily. [name], MD Taking Active   metFORMIN (GLUCOPHAGE) 1000 MG tablet [id number] Yes Take 1 tablet (1,000 mg total) by mouth 2 (two) times daily with a meal. [name], MD Taking Active   Multiple Vitamins-Minerals (MULTIVITAMIN WITH MINERALS) tablet [id number] Yes Take 1 tablet by mouth daily. [provider] Taking Active   prednisoLONE acetate (PRED FORTE) 1 % ophthalmic suspension [id number] Yes Place 1 drop into the right eye daily. [name], MD Taking Active   rosuvastatin (CRESTOR) 10 MG tablet [id number] Yes Take 1 tablet (10 mg total) by mouth daily. [name], MD Taking Active            Med Note (Nashayla Telleria A   Mon Jun 19, 2021 11:40 AM) Taking everyother day  timolol (TIMOPTIC) 0.5 % ophthalmic solution [id number] Yes Place 1 drop into both eyes 2 (two) times daily. [name], MD Taking Active             Patient Active Problem List   Diagnosis Date Noted   Hair thinning 10/28/2020   H/O retinal detachment  06/30/2020   Primary open angle glaucoma (POAG) of both eyes, indeterminate stage 06/30/2020   Coronary artery disease involving native coronary artery of native heart without angina pectoris 04/14/2020   Hyperlipidemia associated with type 2 diabetes mellitus ([place]) 04/14/2020   Pain due to onychomycosis of toenails of both feet 09/16/2019   Diabetes mellitus without complication ([place]) 29/47/6546   Influenza vaccine refused 11/27/2018   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, associated with type 2 diabetes mellitus ([place]) 05/22/2018   Nuclear sclerotic cataract of both eyes 02/11/2018   Retinal edema 02/11/2018   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 02/11/2018   Vitreous hemorrhage, right eye ([place]) 02/11/2018   Legal blindness 07/25/2017   Type 2 diabetes mellitus with retinopathy of both eyes, with long-term current use of insulin ([place]) 02/14/2016   Essential hypertension 02/14/2016   Disorder of skin or subcutaneous tissue 04/24/2013   Onychomycosis due to dermatophyte 03/25/2013  Hallux valgus, acquired 03/25/2013    Conditions to be addressed/monitored per PCP order:  HTN, HLD, and DMII  Care Plan : RN Care Manager Plan of Care  Updates made by Melissa Montane, RN since 12/13/2021 12:00 AM     Problem: Disease management needs related to DMII, HTN and CAD      Long-Range Goal: Development of Plan of Care for needs related to DMII, HTN and CAD   Start Date: 10/27/2020  Expected End Date: 01/16/2022  Priority: High  Note:   Current Barriers:  Chronic Disease Management support and education needs related to CAD, HTN, and DMII-Ms. Gillham reports doing well today. Reports having a broken BP monitor and is unable to read the display. She would like to have a BP monitor for visually impaired. She reports having all of her medications, is aware of upcoming appointments and denies any needs  today.  Film/video editor.   RNCM Clinical Goal(s):  Patient will verbalize understanding of plan for management of CAD, HTN, and DMII work with community resource care guide to address needs related to Financial constraints related to housing and food insecurities    through collaboration with Consulting civil engineer, provider, and care team.  Work with MM Pharmacist for medication management-Met  Interventions: Inter-disciplinary care team collaboration (see longitudinal plan of care) Evaluation of current treatment plan related to  self management and patient's adherence to plan as established by provider Provided therapeutic listening   Hyperlipidemia Interventions:  (Status:  Goal on track:  Yes.) Long Term Goal Medication review performed; medication list updated in electronic medical record.  Provider established cholesterol goals reviewed Counseled on importance of regular laboratory monitoring as prescribed Reviewed role and benefits of statin for ASCVD risk reduction Reviewed importance of limiting foods high in cholesterol Reviewed exercise goals and target of 150 minutes per week   Diabetes:  (Status: Goal on Track (progressing): YES.) Lab Results  Component Value Date   HGBA1C 7.1 (A) 08/22/2021  Assessed patient's understanding of A1c goal: <7% Reviewed medications with patient and discussed importance of medication adherence;        Discussed plans with patient for ongoing care management follow up and provided patient with direct contact information for care management team;      Reviewed scheduled/upcoming provider appointments including: 01/08/22 for diabetic foot care and 01/10/22 with PCP           Review of patient status, including review of consultants reports, relevant laboratory and other test results, and medications completed;       Provided education on managing diabetes   Hypertension: (Status: Goal on Track (progressing): YES.) Last practice recorded BP  readings:  BP Readings from Last 3 Encounters:  08/22/21 (!) 150/91  06/12/21 124/86  12/08/20 134/78  Most recent eGFR/CrCl:  Lab Results  Component Value Date   EGFR 75 08/22/2021    No components found for: "CRCL"  Evaluation of current treatment plan related to hypertension self management and patient's adherence to plan as established by provider;   Reviewed medications with patient and discussed importance of compliance and recent medication changes;  Reviewed scheduled/upcoming provider appointments including: 01/08/22 with [facility] and 01/10/22 with PCP Assessed social determinant of health barriers;  Advised patient to take all of her medications to her PCP appointment Collaborated with PCP for BP monitor for visually impaired  Patient Goals/Self-Care Activities: Patient will self administer medications as prescribed Patient will attend all scheduled provider appointments Patient will call pharmacy for medication  refills Patient will call provider office for new concerns or questions Patient will work with Care Guide for financial and food resources Patient will work with MM Pharmacist for medication management       Follow Up:  Patient agrees to Care Plan and Follow-up.  Plan: The Managed Medicaid care management team will reach out to the patient again over the next 30 days.  Date/time of next scheduled RN care management/care coordination outreach:  01/15/22 @ 2:30pm  Lurena Joiner RN, BSN [place] RN Care Coordinator

## 2021-12-15 ENCOUNTER — Other Ambulatory Visit: Payer: Self-pay | Admitting: *Deleted

## 2022-01-03 ENCOUNTER — Ambulatory Visit: Payer: Medicaid Other | Admitting: Physician Assistant

## 2022-01-08 ENCOUNTER — Encounter: Payer: Self-pay | Admitting: Podiatry

## 2022-01-08 ENCOUNTER — Ambulatory Visit (INDEPENDENT_AMBULATORY_CARE_PROVIDER_SITE_OTHER): Payer: Medicaid Other | Admitting: Podiatry

## 2022-01-08 DIAGNOSIS — B351 Tinea unguium: Secondary | ICD-10-CM

## 2022-01-08 DIAGNOSIS — M79674 Pain in right toe(s): Secondary | ICD-10-CM | POA: Diagnosis not present

## 2022-01-08 DIAGNOSIS — M79675 Pain in left toe(s): Secondary | ICD-10-CM | POA: Diagnosis not present

## 2022-01-08 DIAGNOSIS — E119 Type 2 diabetes mellitus without complications: Secondary | ICD-10-CM | POA: Diagnosis not present

## 2022-01-08 DIAGNOSIS — M2010 Hallux valgus (acquired), unspecified foot: Secondary | ICD-10-CM

## 2022-01-08 NOTE — Progress Notes (Signed)
This patient returns to my office for at risk foot care.  This patient requires this care by a professional since this patient will be at risk due to having type 2 diabetes.    This patient is unable to cut nails herself since the patient cannot reach her nails.These nails are painful walking and wearing shoes.  This patient presents for at risk foot care today.  General Appearance  Alert, conversant and in no acute stress.  Vascular  Dorsalis pedis and posterior tibial  pulses are  Weakly palpable  bilaterally.  Capillary return is within normal limits  bilaterally. Temperature is within normal limits  bilaterally.  Neurologic  Senn-Weinstein monofilament wire test within normal limits  bilaterally. Muscle power within normal limits bilaterally.  Nails Thick disfigured discolored nails with subungual debris  from hallux to fifth toes bilaterally. No evidence of bacterial infection or drainage bilaterally.  Orthopedic  No limitations of motion  feet .  No crepitus or effusions noted.  No bony pathology or digital deformities noted. Mild  HAV  B/L.  Skin  normotropic skin with no porokeratosis noted bilaterally.  No signs of infections or ulcers noted.     Onychomycosis  Pain in right toes  Pain in left toes  Consent was obtained for treatment procedures.   Mechanical debridement of nails 1-5  bilaterally performed with a nail nipper.  Filed with dremel without incident.    Return office visit  3 months                    Told patient to return for periodic foot care and evaluation due to potential at risk complications.   Gardiner Barefoot DPM

## 2022-01-10 ENCOUNTER — Ambulatory Visit: Payer: Medicaid Other | Attending: Physician Assistant | Admitting: Physician Assistant

## 2022-01-10 ENCOUNTER — Encounter: Payer: Self-pay | Admitting: Physician Assistant

## 2022-01-10 ENCOUNTER — Other Ambulatory Visit (HOSPITAL_COMMUNITY)
Admission: RE | Admit: 2022-01-10 | Discharge: 2022-01-10 | Disposition: A | Discharge status: Home / Self Care | Payer: Medicaid Other | Source: Ambulatory Visit | Attending: Physician Assistant | Admitting: Physician Assistant

## 2022-01-10 VITALS — BP 169/89 | HR 77 | Wt 220.4 lb

## 2022-01-10 DIAGNOSIS — E118 Type 2 diabetes mellitus with unspecified complications: Secondary | ICD-10-CM

## 2022-01-10 DIAGNOSIS — Z124 Encounter for screening for malignant neoplasm of cervix: Secondary | ICD-10-CM | POA: Diagnosis not present

## 2022-01-10 NOTE — Progress Notes (Signed)
Patient ID: Lisa Olson, female   DOB: [birth year], 62 y.o.   MRN: [id number]   Lisa Olson, is a 61 y.o. female  EYE:[id number]  LPN:[id number]  DOB - Jun 28, 1960  Chief Complaint  Patient presents with   Gynecologic Exam       Subjective:   Lisa Olson is a 61 y.o. female here today for pap only.  no new issues or concerns.  No vaginal discharge  No problems updated.  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 Authorizing Provider  Accu-Chek Softclix Lancets lancets Use as instructed 3 times a day  before meals 09/08/21   Charlott Rakes, MD  Ascorbic Acid (VITAMIN C ER PO) Take by mouth.    [provider]  Blood Glucose Monitoring Suppl (ACCU-CHEK GUIDE ME) w/Device KIT Use to check blood sugar once daily. E11.69 05/20/20   [name], MD  Blood Glucose Monitoring Suppl (ACCU-CHEK GUIDE) w/Device KIT Use as directed 3 times a day 09/08/21   Charlott Rakes, MD  carvedilol (COREG) 6.25 MG tablet Take 1 tablet (6.25 mg total) by mouth 2 (two) times daily with a meal. 08/22/21   [name], MD  Difluprednate (DUREZOL) 0.05 % EMUL Place 1 drop into the right eye daily. Patient not taking: Reported on 12/13/2021 08/02/21   [name], MD  DUREZOL 0.05 % EMUL Place 1 drop into the right eye 2 (two) times daily. 04/14/20   [provider]  ferrous sulfate 325 (65 FE) MG tablet Take 1 tablet (325 mg total) by mouth daily with breakfast. 07/29/17   [name], MD  furosemide (LASIX) 20 MG tablet Take 1 tablet (20 mg total) by mouth daily. Stop Hydrochlorothiazide 08/23/21   [name], MD  glimepiride (AMARYL) 4 MG tablet Take 1 tablet (4 mg total) by mouth 2 (two) times daily.  08/23/21   [name], MD  glucose blood (ACCU-CHEK GUIDE) test strip Use as instructed 3 times a day 09/08/21   Charlott Rakes, MD  isosorbide mononitrate (IMDUR) 30 MG 24 hr tablet Take 1 tablet (30 mg total) by mouth daily. 08/23/21   [name], MD  latanoprost (XALATAN) 0.005 % ophthalmic solution Place 1 drop into the left eye at bedtime. 08/02/21 08/02/22  [name], MD  lisinopril (ZESTRIL) 30 MG tablet Take 1 tablet (30 mg total) by mouth daily. 08/23/21   [name], MD  metFORMIN (GLUCOPHAGE) 1000 MG tablet Take 1 tablet (1,000 mg total) by mouth 2 (two) times daily with a meal. 08/23/21   [name], MD  Multiple Vitamins-Minerals (MULTIVITAMIN WITH MINERALS) tablet Take 1 tablet by mouth daily.    [provider]  prednisoLONE acetate (PRED FORTE) 1 % ophthalmic suspension Place 1 drop into the right eye daily. 08/07/21   [name], MD  rosuvastatin (CRESTOR) 10 MG tablet Take 1 tablet (10 mg total) by mouth daily. 11/09/20   [name], MD  timolol (TIMOPTIC) 0.5 % ophthalmic solution Place 1 drop into both eyes 2 (two) times daily. 08/02/21 08/02/22  [name], MD    ROS: Neg HEENT Neg resp Neg cardiac Neg GI Neg GU Neg MS Neg psych Neg  neuro  Objective:   Vitals:   01/10/22 1351  BP: (!) 169/89  Pulse: 77  SpO2: 98%  Weight: 220 lb 6.4 oz (100 kg)   Exam General appearance : Awake, alert, not in any distress. Speech Clear. Not toxic looking HEENT: Atraumatic and Normocephalic Neck: Supple, no JVD. No cervical lymphadenopathy.  GU: external genitalia WNL-speculum inserted.  No vaginal lesions.  Cervix WNL w/o lesion.  Pap taken Extremities: B/L Lower Ext shows no edema, both legs are warm to touch Neurology: Awake alert, and oriented X 3, CN II-XII intact, Non focal Skin: No Rash  Data Review Lab Results  Component Value Date   HGBA1C 7.1 (A) 08/22/2021   HGBA1C 7.0 10/28/2020   HGBA1C 8.1 (A) 04/14/2020     Assessment & Plan   1. Controlled type 2 diabetes mellitus with complication, without long-term current use of insulin (HCC) RTC for PCP next month - Glucose ([facility])  2. Screening for cervical cancer - Cytology - PAP([place])    Return in about 1 month (around 02/10/2022) for PCP for DM and chronic medical conditions.  The patient was given clear instructions to go to ER or return to medical center if symptoms don't improve, worsen or new problems develop. The patient verbalized understanding. The patient was told to call to get lab results if they haven't heard anything in the next week.      Freeman Caldron, PA-C [facility], [place]   01/10/2022, 2:03 PM

## 2022-01-15 ENCOUNTER — Other Ambulatory Visit: Payer: Self-pay | Admitting: *Deleted

## 2022-01-15 LAB — CYTOLOGY - PAP
Comment: NEGATIVE
Diagnosis: NEGATIVE
High risk HPV: NEGATIVE

## 2022-01-15 NOTE — Patient Instructions (Signed)
Visit Information  Ms. Brillhart was given information about Medicaid Managed Care team care coordination services as a part of their [place] Medicaid benefit. [name] verbally consented to engagement with the [facility] Managed Care team.   If you are experiencing a medical emergency, please call 911 or report to your local emergency department or urgent care.   If you have a non-emergency medical problem during routine business hours, please contact your provider's office and ask to speak with a nurse.   For questions related to your [facility], please call: [phone] or visit the homepage here: [email or web address]  If you would like to schedule transportation through your [facility], please call the following number at least 2 days in advance of your appointment: [phone]   Rides for urgent appointments can also be made after hours by calling Member Services.  Call the [place] at [phone], at any time, 24 hours a day, 7 days a week. If you are in danger or need immediate medical attention call 911.  If you would like help to quit smoking, call 1-800-QUIT-NOW 651-117-0784) OR Espaol: 1-855-Djelo-Ya (1-937-902-4097) o para ms informacin haga clic aqu or Text READY to 200-400 to register via text  Ms. Hassell Done,   Please see education materials related to HTN and diabetes provided as Advertising account planner.   The patient verbalized understanding of instructions, educational materials, and care plan provided today and agreed to receive a mailed copy of patient instructions, educational materials, and care plan.   Telephone follow up appointment with Managed Medicaid care management team member scheduled for:02/14/22 @ 2:30pm  Lurena Joiner RN, BSN [place] RN Care Coordinator   Following is a copy of your  plan of care:  Care Plan : RN Care Manager Plan of Care  Updates made by Melissa Montane, RN since 01/15/2022 12:00 AM     Problem: Disease management needs related to DMII, HTN and CAD      Long-Range Goal: Development of Plan of Care for needs related to DMII, HTN and CAD   Start Date: 10/27/2020  Expected End Date: 02/15/2022  Priority: High  Note:   Current Barriers:  Chronic Disease Management support and education needs related to CAD, HTN, and DMII-Ms. Taddeo had a recent elevated BP during a provider visit. She had not taken her BP medication and was nervous regarding the nature of the appointment. She reports [employer] fruit and vegetable benefit is currently on hold and she received a check for the vitamin benefit which was voided. She is frustrated about the [facility] member benefits. She reports having all of her medications and is aware of upcoming appointments.  Film/video editor.   RNCM Clinical Goal(s):  Patient will verbalize understanding of plan for management of CAD, HTN, and DMII work with community resource care guide to address needs related to Financial constraints related to housing and food insecurities    through collaboration with Consulting civil engineer, provider, and care team.  Work with MM Pharmacist for medication management-Met  Interventions: Inter-disciplinary care team collaboration (see longitudinal plan of care) Evaluation of current treatment plan related to  self management and patient's adherence to plan as established by provider Provided therapeutic listening BSW referral for financial resources-appointment scheduled for 01/22/22 @ 3pm   Hyperlipidemia Interventions:  (Status:  Goal on track:  Yes.) Long Term Goal Medication review performed; medication list updated in electronic medical record.  Counseled  on importance of regular laboratory monitoring as prescribed Provided HLD educational materials Reviewed importance of limiting foods high in  cholesterol Assessed social determinant of health barriers    Diabetes:  (Status: Goal on Track (progressing): YES.) Lab Results  Component Value Date   HGBA1C 7.1 (A) 08/22/2021  Assessed patient's understanding of A1c goal: <7% Reviewed medications with patient and discussed importance of medication adherence;        Discussed plans with patient for ongoing care management follow up and provided patient with direct contact information for care management team;      Reviewed scheduled/upcoming provider appointments including: 01/22/22 with BSW and 02/19/22 with PCP           Review of patient status, including review of consultants reports, relevant laboratory and other test results, and medications completed;          Hypertension: (Status: Goal Not Met.) Last practice recorded BP readings:  BP Readings from Last 3 Encounters:  01/10/22 (!) 169/89  08/22/21 (!) 150/91  06/12/21 124/86  Most recent eGFR/CrCl:  Lab Results  Component Value Date   EGFR 75 08/22/2021    No components found for: "CRCL"  Evaluation of current treatment plan related to hypertension self management and patient's adherence to plan as established by provider;   Reviewed medications with patient and discussed importance of compliance and recent medication changes;  Reviewed scheduled/upcoming provider appointments including: 01/22/22 with BSW and 02/19/22 with PCP Assessed social determinant of health barriers;  Advised patient to take all of her medications to her PCP appointment Advised patient to contact [facility] member services to request DME-BP monitor with voice reading Advised patient to take BP medication prior to her provider appointment  Patient Goals/Self-Care Activities: Patient will self administer medications as prescribed Patient will attend all scheduled provider appointments Patient will call pharmacy for medication refills Patient will call provider office for new concerns or  questions Patient will work with Care Guide for financial and food resources Patient will work with MM Pharmacist for medication management

## 2022-01-15 NOTE — Patient Outreach (Signed)
Medicaid Managed Care   Nurse Care Manager Note  01/15/2022 Name:  Lisa Olson MRN:  [id number] DOB:  11/18/1960  Lisa Olson is an 61 y.o. year old female who is a primary patient of Lisa Pier, MD.  The [facility] Managed Care Coordination team was consulted for assistance with:    HTN HLD DMII  Lisa Olson was given information about Medicaid Managed Care Coordination team services today. Lisa Olson Patient agreed to services and verbal consent obtained.  Engaged with patient by telephone for follow up visit in response to provider referral for case management and/or care coordination services.   Assessments/Interventions:  Review of past medical history, allergies, medications, health status, including review of consultants reports, laboratory and other test data, was performed as part of comprehensive evaluation and provision of chronic care management services.  SDOH (Social Determinants of Health) assessments and interventions performed: SDOH Interventions    Flowsheet Row Patient Outreach Telephone from 01/15/2022 in [place] Patient Outreach Telephone from 06/19/2021 in [place] Patient Outreach Telephone from 04/12/2021 in [place] Patient Outreach Telephone from 12/07/2020 in [place] Patient Outreach Telephone from 11/08/2020 in [place] Patient Outreach Telephone from 10/27/2020 in [place] Interventions        Food Insecurity Interventions Intervention Not Indicated  [Utilizing SNAP benefits] Patient Refused -- Other (Comment)  Engineer, maintenance Guide referral for assistance with food pantries] -- --  Housing Interventions -- -- Intervention Not Indicated -- -- Other (Comment)  [Referral for Care Guide for assistance with section 8]   Transportation Interventions Other (Comment)  [Patient using [place] [employer], waiting for mobility trainer with Division of Blind Services to help fill out application.] Intervention Not Indicated -- --  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, [name] for medical transportation, [facility] for medication delivery] Other (Comment)  [Will get meds delivered since she told me, "I'm blind in one eye and have trouble driving"] Intervention Not Indicated  Financial Strain Interventions -- -- -- -- Other (Comment)  [Will get meds delivered at beginning of the month when she gets paid to ease burden of affording meds] --  Social Connections Interventions -- -- Intervention Not Indicated -- -- --       Care Plan  No Known Allergies  Medications Reviewed Today     Reviewed by Melissa Montane, RN (Registered Nurse) on 01/15/22 at 1451  Med List Status: <None>   Medication Order Taking? Sig Documenting Provider Last Dose Status Informant  Accu-Chek Softclix Lancets lancets [id number] Yes Use as instructed 3 times a day  before meals Charlott Rakes, MD Taking Active   Ascorbic Acid (VITAMIN C ER PO) [id number] Yes Take by mouth. [provider] Taking Active   Blood Glucose Monitoring Suppl (ACCU-CHEK GUIDE ME) w/Device KIT [id number] Yes Use to check blood sugar once daily. E11.69 Lisa Pier, MD Taking Active   Blood Glucose Monitoring Suppl (ACCU-CHEK GUIDE) w/Device KIT [id number] Yes Use as directed 3 times a day Charlott Rakes, MD Taking Active   carvedilol (COREG) 6.25 MG tablet [id number] Yes Take 1 tablet (6.25 mg total) by mouth 2 (two) times daily with a meal. Lisa Pier, MD Taking Active   Difluprednate (DUREZOL) 0.05 % EMUL [id number] No Place 1 drop into the right eye daily.  Patient not taking: Reported on 01/15/2022   [name], MD Not  Taking Active            Med Note ([place] Mullett A   Wed Dec 13, 2021  2:38 PM) On back order  DUREZOL 0.05 % EMUL [id number] No  Place 1 drop into the right eye 2 (two) times daily.  Patient not taking: Reported on 01/15/2022   [provider] Not Taking Active            Med Note (Berlyn Malina A   Thu Oct 27, 2020 12:54 PM) Once daily  ferrous sulfate 325 (65 FE) MG tablet [id number] Yes Take 1 tablet (325 mg total) by mouth daily with breakfast. Lisa Pier, MD Taking Active            Med Note [name], Jariyah Hackley A   Thu Oct 27, 2020 12:58 PM) Taking at night  furosemide (LASIX) 20 MG tablet [id number] Yes Take 1 tablet (20 mg total) by mouth daily. Stop Hydrochlorothiazide Lisa Pier, MD Taking Active   glimepiride (AMARYL) 4 MG tablet [id number] Yes Take 1 tablet (4 mg total) by mouth 2 (two) times daily. Lisa Pier, MD Taking Active   glucose blood (ACCU-CHEK GUIDE) test strip [id number] Yes Use as instructed 3 times a day Charlott Rakes, MD Taking Active   isosorbide mononitrate (IMDUR) 30 MG 24 hr tablet [id number] Yes Take 1 tablet (30 mg total) by mouth daily. Lisa Pier, MD Taking Active   latanoprost (XALATAN) 0.005 % ophthalmic solution [id number] Yes Place 1 drop into the left eye at bedtime. [name], MD Taking Active   lisinopril (ZESTRIL) 30 MG tablet [id number] Yes Take 1 tablet (30 mg total) by mouth daily. Lisa Pier, MD Taking Active   metFORMIN (GLUCOPHAGE) 1000 MG tablet [id number] Yes Take 1 tablet (1,000 mg total) by mouth 2 (two) times daily with a meal. Lisa Pier, MD Taking Active   Multiple Vitamins-Minerals (MULTIVITAMIN WITH MINERALS) tablet [id number] Yes Take 1 tablet by mouth daily. [provider] Taking Active   prednisoLONE acetate (PRED FORTE) 1 % ophthalmic suspension [id number] Yes Place 1 drop into the right eye daily. [name], MD Taking Active   rosuvastatin (CRESTOR) 10 MG tablet [id number] Yes Take 1 tablet (10 mg total) by mouth daily. Lisa Pier, MD Taking Active            Med Note (Loreena Valeri A   Mon Jun 19, 2021 11:40 AM) Taking everyother day  timolol (TIMOPTIC) 0.5 % ophthalmic solution [id number] Yes Place 1 drop into both eyes 2 (two) times daily. [name], MD Taking Active             Patient Active Problem List   Diagnosis Date Noted   Hair thinning 10/28/2020   H/O retinal detachment 06/30/2020   Primary open angle glaucoma (POAG) of both eyes, indeterminate stage 06/30/2020   Coronary artery disease involving native coronary artery of native heart without angina pectoris 04/14/2020   Hyperlipidemia associated with type 2 diabetes mellitus ([place]) 04/14/2020   Pain due to onychomycosis of toenails of both feet 09/16/2019   Diabetes mellitus without complication ([place]) 89/37/3428   Influenza vaccine refused 11/27/2018   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, associated with type 2 diabetes mellitus ([place]) 05/22/2018   Nuclear sclerotic cataract of both eyes 02/11/2018   Retinal edema 02/11/2018   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 02/11/2018   Vitreous hemorrhage, right eye ([place]) 02/11/2018  Legal blindness 07/25/2017   Type 2 diabetes mellitus with retinopathy of both eyes, with long-term current use of insulin ([place]) 02/14/2016   Essential hypertension 02/14/2016   Disorder of skin or subcutaneous tissue 04/24/2013   Onychomycosis due to dermatophyte 03/25/2013   Hallux valgus, acquired 03/25/2013    Conditions to be addressed/monitored per PCP order:  HTN, HLD, and DMII  Care Plan : RN Care Manager Plan of Care  Updates made by Melissa Montane, RN since 01/15/2022 12:00 AM     Problem: Disease management needs related to DMII, HTN and CAD      Long-Range Goal: Development of Plan of Care for needs related to DMII, HTN and CAD   Start Date: 10/27/2020  Expected End Date: 02/15/2022  Priority: High  Note:   Current Barriers:  Chronic Disease Management support  and education needs related to CAD, HTN, and DMII-[name] had a recent elevated BP during a provider visit. She had not taken her BP medication and was nervous regarding the nature of the appointment. She reports [employer] fruit and vegetable benefit is currently on hold and she received a check for the vitamin benefit which was voided. She is frustrated about the [facility] member benefits. She reports having all of her medications and is aware of upcoming appointments.  Film/video editor.   RNCM Clinical Goal(s):  Patient will verbalize understanding of plan for management of CAD, HTN, and DMII work with community resource care guide to address needs related to Financial constraints related to housing and food insecurities    through collaboration with Consulting civil engineer, provider, and care team.  Work with MM Pharmacist for medication management-[facility]  Interventions: Inter-disciplinary care team collaboration (see longitudinal plan of care) Evaluation of current treatment plan related to  self management and patient's adherence to plan as established by provider Provided therapeutic listening BSW referral for financial resources-appointment scheduled for 01/22/22 @ 3pm   Hyperlipidemia Interventions:  (Status:  Goal on track:  Yes.) Long Term Goal Medication review performed; medication list updated in electronic medical record.  Counseled on importance of regular laboratory monitoring as prescribed Provided HLD educational materials Reviewed importance of limiting foods high in cholesterol Assessed social determinant of health barriers    Diabetes:  (Status: Goal on Track (progressing): YES.) Lab Results  Component Value Date   HGBA1C 7.1 (A) 08/22/2021  Assessed patient's understanding of A1c goal: <7% Reviewed medications with patient and discussed importance of medication adherence;        Discussed plans with patient for ongoing care management follow up and provided patient with direct contact  information for care management team;      Reviewed scheduled/upcoming provider appointments including: 01/22/22 with BSW and 02/19/22 with PCP           Review of patient status, including review of consultants reports, relevant laboratory and other test results, and medications completed;          Hypertension: (Status: Goal Not Met.) Last practice recorded BP readings:  BP Readings from Last 3 Encounters:  01/10/22 (!) 169/89  08/22/21 (!) 150/91  06/12/21 124/86  Most recent eGFR/CrCl:  Lab Results  Component Value Date   EGFR 75 08/22/2021    No components found for: "CRCL"  Evaluation of current treatment plan related to hypertension self management and patient's adherence to plan as established by provider;   Reviewed medications with patient and discussed importance of compliance and recent medication changes;  Reviewed scheduled/upcoming provider appointments including: 01/22/22  with BSW and 02/19/22 with PCP Assessed social determinant of health barriers;  Advised patient to take all of her medications to her PCP appointment Advised patient to contact [facility] member services to request DME-BP monitor with voice reading Advised patient to take BP medication prior to her provider appointment  Patient Goals/Self-Care Activities: Patient will self administer medications as prescribed Patient will attend all scheduled provider appointments Patient will call pharmacy for medication refills Patient will call provider office for new concerns or questions Patient will work with Care Guide for financial and food resources Patient will work with MM Pharmacist for medication management       Follow Up:  Patient agrees to Care Plan and Follow-up.  Plan: The Managed Medicaid care management team will reach out to the patient again over the next 30 days.  Date/time of next scheduled RN care management/care coordination outreach:  02/14/22 @ 3pm  Lurena Joiner RN, BSN [place] RN Care Coordinator

## 2022-01-16 ENCOUNTER — Other Ambulatory Visit: Payer: Medicaid Other

## 2022-01-17 ENCOUNTER — Other Ambulatory Visit: Payer: Self-pay | Admitting: Physician Assistant

## 2022-01-17 MED ORDER — FLUCONAZOLE 150 MG PO TABS
150.0000 mg | ORAL_TABLET | Freq: Once | ORAL | 0 refills | Status: AC
Start: 2022-01-17 — End: 2022-01-17

## 2022-01-22 ENCOUNTER — Other Ambulatory Visit: Payer: Medicaid Other

## 2022-01-22 NOTE — Patient Outreach (Signed)
Medicaid Managed Care Social Work Note  01/22/2022 Name:  Lisa Olson MRN:  [id number] DOB:  1960/03/25  Lisa Olson is an 61 y.o. year old female who is a primary patient of [name], MD.  The Medicaid Managed Care Coordination team was consulted for assistance with:  Community Resources   Lisa Olson was given information about Medicaid Managed Care Coordination team services today. [name] Patient agreed to services and verbal consent obtained.  Engaged with patient  for by telephone forinitial visit in response to referral for case management and/or care coordination services.   Assessments/Interventions:  Review of past medical history, allergies, medications, health status, including review of consultants reports, laboratory and other test data, was performed as part of comprehensive evaluation and provision of chronic care management services.  SDOH: (Social Determinant of Health) assessments and interventions performed: SDOH Interventions    Flowsheet Row Patient Outreach Telephone from 01/15/2022 in [place] Patient Outreach Telephone from 06/19/2021 in [place] Patient Outreach Telephone from 04/12/2021 in [place] Patient Outreach Telephone from 12/07/2020 in [place] Patient Outreach Telephone from 11/08/2020 in [place] Patient Outreach Telephone from 10/27/2020 in [place] Interventions        Food Insecurity Interventions Intervention Not Indicated  [Utilizing SNAP benefits] Patient Refused -- Other (Comment)  Engineer, maintenance Guide referral for assistance with food pantries] -- --  Housing Interventions -- -- Intervention Not Indicated -- -- Other (Comment)  [Referral for Care Guide for assistance with section 8]  Transportation  Interventions Other (Comment)  [Patient using [place] [employer], waiting for mobility trainer with Division of [employer] to help fill out application.] Intervention Not Indicated -- --  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 She stated she is in need of a blood pressure cuff. Patient states she does receive foodstamps but may not receive them this month. BSW offered patient some food resources but she did not want them. Patient states no other resources or services are needed at this time. BSW will send a message to patients PCP for a blood pressure cuff to be sent to [facility].  Advanced Directives Status:  Not addressed in this encounter.  Care Plan                 No Known Allergies  Medications Reviewed Today     Reviewed by Melissa Montane, RN (Registered Nurse) on 01/15/22 at 1451  Med List Status: <None>   Medication Order Taking? Sig Documenting Provider Last Dose Status Informant  Accu-Chek Softclix Lancets lancets [id number] Yes Use as instructed 3 times a day  before meals Charlott Rakes, MD Taking Active   Ascorbic Acid (VITAMIN C ER PO) [id number] Yes Take by mouth. [provider] Taking Active   Blood Glucose Monitoring Suppl (ACCU-CHEK GUIDE ME) w/Device KIT [id number] Yes Use to check blood sugar once  daily. E11.69 [name], MD Taking Active   Blood Glucose Monitoring Suppl (ACCU-CHEK GUIDE) w/Device KIT [id number] Yes Use as directed 3 times a day Charlott Rakes, MD Taking Active   carvedilol (COREG) 6.25 MG tablet [id number] Yes Take 1 tablet (6.25 mg total) by mouth 2 (two) times daily with a meal. [name], MD Taking Active   Difluprednate (DUREZOL) 0.05 % EMUL [id number] No Place 1 drop into the right eye daily.  Patient not taking: Reported on 01/15/2022   [name], MD Not Taking Active            Med Note ([name], [name]   Wed Dec 13, 2021  2:38 PM) On back order  DUREZOL 0.05 % EMUL [id number] No Place 1 drop into the right eye 2 (two) times daily.  Patient not taking: Reported on 01/15/2022   [provider] Not Taking Active            Med Note ([name], [name]   Thu Oct 27, 2020 12:54 PM) Once daily  ferrous sulfate 325 (65 FE) MG tablet [id number] Yes Take 1 tablet (325 mg total) by mouth daily with breakfast. [name], MD Taking Active            Med Note [name], [name]   Thu Oct 27, 2020 12:58 PM) Taking at night  furosemide (LASIX) 20 MG tablet [id number] Yes Take 1 tablet (20 mg total) by mouth daily. Stop Hydrochlorothiazide [name], MD Taking Active   glimepiride (AMARYL) 4 MG tablet [id number] Yes Take 1 tablet (4 mg total) by mouth 2 (two) times daily. [name], MD Taking Active   glucose blood (ACCU-CHEK GUIDE) test strip [id number] Yes Use as instructed 3 times a day Charlott Rakes, MD Taking Active   isosorbide mononitrate (IMDUR) 30 MG 24 hr tablet [id number] Yes Take 1 tablet (30 mg total) by mouth daily. [name], MD Taking Active   latanoprost (XALATAN) 0.005 % ophthalmic solution [id number] Yes Place 1 drop into the left eye at bedtime. [name], MD Taking Active   lisinopril (ZESTRIL) 30 MG tablet [id number] Yes Take 1 tablet (30 mg total) by mouth daily. [name], MD Taking Active   metFORMIN (GLUCOPHAGE) 1000 MG tablet [id number] Yes Take 1 tablet (1,000 mg total) by mouth 2 (two) times daily with a meal. [name], MD Taking Active    Multiple Vitamins-Minerals (MULTIVITAMIN WITH MINERALS) tablet [id number] Yes Take 1 tablet by mouth daily. [provider] Taking Active   prednisoLONE acetate (PRED FORTE) 1 % ophthalmic suspension [id number] Yes Place 1 drop into the right eye daily. [name], MD Taking Active   rosuvastatin (CRESTOR) 10 MG tablet [id number] Yes Take 1 tablet (10 mg total) by mouth daily. [name], MD Taking Active            Med Note ([name], [name]   Mon Jun 19, 2021 11:40 AM) Taking everyother day  timolol (TIMOPTIC) 0.5 % ophthalmic solution [id number] Yes Place 1 drop into both eyes 2 (two) times daily. [name], MD Taking Active             Patient Active Problem List   Diagnosis Date Noted   Hair thinning 10/28/2020   H/O retinal detachment 06/30/2020   Primary open angle glaucoma (POAG) of both eyes, indeterminate stage 06/30/2020   Coronary artery disease involving native coronary artery of native heart without angina pectoris 04/14/2020  Hyperlipidemia associated with type 2 diabetes mellitus ([place]) 04/14/2020   Pain due to onychomycosis of toenails of both feet 09/16/2019   Diabetes mellitus without complication ([place]) 59/93/5701   Influenza vaccine refused 11/27/2018   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, associated with type 2 diabetes mellitus ([place]) 05/22/2018   Nuclear sclerotic cataract of both eyes 02/11/2018   Retinal edema 02/11/2018   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 02/11/2018   Vitreous hemorrhage, right eye ([place]) 02/11/2018   Legal blindness 07/25/2017   Type 2 diabetes mellitus with retinopathy of both eyes, with long-term current use of insulin ([place]) 02/14/2016   Essential hypertension 02/14/2016   Disorder of skin or subcutaneous tissue 04/24/2013   Onychomycosis due to dermatophyte 03/25/2013   Hallux valgus, acquired 03/25/2013     Conditions to be addressed/monitored per PCP order:   blood pressure cuff  Care Plan : RN Care Manager Plan of Care  Updates made by Ethelda Chick since 01/22/2022 12:00 AM     Problem: Disease management needs related to DMII, HTN and CAD      Long-Range Goal: Development of Plan of Care for needs related to DMII, HTN and CAD   Start Date: 10/27/2020  Expected End Date: 02/15/2022  Priority: High  Note:   Current Barriers:  Chronic Disease Management support and education needs related to CAD, HTN, and DMII-Lisa Olson had a recent elevated BP during a provider visit. She had not taken her BP medication and was nervous regarding the nature of the appointment. She reports UHC fruit and vegetable benefit is currently on hold and she received a check for the vitamin benefit which was voided. She is frustrated about the [facility] member benefits. She reports having all of her medications and is aware of upcoming appointments.  Film/video editor.   RNCM Clinical Goal(s):  Patient will verbalize understanding of plan for management of CAD, HTN, and DMII work with community resource care guide to address needs related to Financial constraints related to housing and food insecurities    through collaboration with Consulting civil engineer, provider, and care team.  Work with MM Pharmacist for medication management-Met  Interventions: Inter-disciplinary care team collaboration (see longitudinal plan of care) Evaluation of current treatment plan related to  self management and patient's adherence to plan as established by provider Provided therapeutic listening BSW referral for financial resources-appointment scheduled for 01/22/22 @ 3pm Collaborated with Pricilla Riffle with [facility] regarding patient's benefits Provided patient with update from [facility] representative, [place] will request a new check be issued BSW completed a telephone outreach with patient. She stated she is in need of a blood pressure cuff.  Patient states she does receive foodstamps but may not receive them this month. BSW offered patient some food resources but she did not want them. Patient states no other resources or services are needed at this time. BSW will send a message to patients PCP for a blood pressure cuff to be sent to [facility].   Hyperlipidemia Interventions:  (Status:  Goal on track:  Yes.) Long Term Goal Medication review performed; medication list updated in electronic medical record.  Counseled on importance of regular laboratory monitoring as prescribed Provided HLD educational materials Reviewed importance of limiting foods high in cholesterol Assessed social determinant of health barriers    Diabetes:  (Status: Goal on Track (progressing): YES.) Lab Results  Component Value Date   HGBA1C 7.1 (A) 08/22/2021  Assessed patient's  understanding of A1c goal: <7% Reviewed medications with patient and discussed importance of medication adherence;        Discussed plans with patient for ongoing care management follow up and provided patient with direct contact information for care management team;      Reviewed scheduled/upcoming provider appointments including: 01/22/22 with BSW and 02/19/22 with PCP           Review of patient status, including review of consultants reports, relevant laboratory and other test results, and medications completed;          Hypertension: (Status: Goal Not Met.) Last practice recorded BP readings:  BP Readings from Last 3 Encounters:  01/10/22 (!) 169/89  08/22/21 (!) 150/91  06/12/21 124/86  Most recent eGFR/CrCl:  Lab Results  Component Value Date   EGFR 75 08/22/2021    No components found for: "CRCL"  Evaluation of current treatment plan related to hypertension self management and patient's adherence to plan as established by provider;   Reviewed medications with patient and discussed importance of compliance and recent medication changes;  Reviewed  scheduled/upcoming provider appointments including: 01/22/22 with BSW and 02/19/22 with PCP Assessed social determinant of health barriers;  Advised patient to take all of her medications to her PCP appointment Advised patient to contact [facility] member services to request DME-BP monitor with voice reading Advised patient to take BP medication prior to her provider appointment  Patient Goals/Self-Care Activities: Patient will self administer medications as prescribed Patient will attend all scheduled provider appointments Patient will call pharmacy for medication refills Patient will call provider office for new concerns or questions Patient will work with Care Guide for financial and food resources Patient will work with MM Pharmacist for medication management       Follow up:  Patient agrees to Care Plan and Follow-up.  Plan: The Managed Medicaid care management team will reach out to the patient again over the next 30 days.  Date/time of next scheduled Social Work care management/care coordination outreach:  02/21/22  Mickel Fuchs, Arita Miss, [place] Medicaid Team  [phone]

## 2022-01-22 NOTE — Patient Instructions (Signed)
Visit Information  Ms. Cadle was given information about Medicaid Managed Care team care coordination services as a part of their [place] Medicaid benefit. [name] verbally consented to engagement with the [facility] Managed Care team.   If you are experiencing a medical emergency, please call 911 or report to your local emergency department or urgent care.   If you have a non-emergency medical problem during routine business hours, please contact your provider's office and ask to speak with a nurse.   For questions related to your [facility], please call: [phone] or visit the homepage here: [email or web address]  If you would like to schedule transportation through your [facility], please call the following number at least 2 days in advance of your appointment: [phone]   Rides for urgent appointments can also be made after hours by calling Member Services.  Call the [place] at [phone], at any time, 24 hours a day, 7 days a week. If you are in danger or need immediate medical attention call 911.  If you would like help to quit smoking, call 1-800-QUIT-NOW 802-774-7614) OR Espaol: 1-855-Djelo-Ya (5-027-741-2878) o para ms informacin haga clic aqu or Text READY to 200-400 to register via text  Ms. Hassell Done - following are the goals we discussed in your visit today:   Goals Addressed   None      Social Worker will follow up in 30 days.   Mickel Fuchs, BSW, [place]  High Risk Managed Medicaid Team  [phone]   Following is a copy of your plan of care:  Care Plan : [place] of Care  Updates made by Ethelda Chick since 01/22/2022 12:00 AM     Problem: Disease management needs related to DMII, HTN and CAD      Long-Range Goal: Development of Plan of Care for needs  related to DMII, HTN and CAD   Start Date: 10/27/2020  Expected End Date: 02/15/2022  Priority: High  Note:   Current Barriers:  Chronic Disease Management support and education needs related to CAD, HTN, and DMII-Ms. Selke had a recent elevated BP during a provider visit. She had not taken her BP medication and was nervous regarding the nature of the appointment. She reports UHC fruit and vegetable benefit is currently on hold and she received a check for the vitamin benefit which was voided. She is frustrated about the [facility] member benefits. She reports having all of her medications and is aware of upcoming appointments.  Film/video editor.   RNCM Clinical Goal(s):  Patient will verbalize understanding of plan for management of CAD, HTN, and DMII work with community resource care guide to address needs related to Financial constraints related to housing and food insecurities    through collaboration with Consulting civil engineer, provider, and care team.  Work with MM Pharmacist for medication management-Met  Interventions: Inter-disciplinary care team collaboration (see longitudinal plan of care) Evaluation of current treatment plan related to  self management and patient's adherence to plan as established by provider Provided therapeutic listening BSW referral for financial resources-appointment scheduled for 01/22/22 @ 3pm Collaborated with Pricilla Riffle with [facility] regarding patient's benefits Provided patient with update from [facility] representative, [place] will request a new check be issued BSW completed a telephone outreach with patient. She stated she is in need of a blood pressure cuff. Patient states she does receive foodstamps but may not receive  them this month. BSW offered patient some food resources but she did not want them. Patient states no other resources or services are needed at this time. BSW will send a message to patients PCP for a blood pressure cuff to be sent to [facility].   Hyperlipidemia Interventions:  (Status:  Goal on track:  Yes.) Long Term Goal Medication review performed; medication list updated in electronic medical record.  Counseled on importance of regular laboratory monitoring as prescribed Provided HLD educational materials Reviewed importance of limiting foods high in cholesterol Assessed social determinant of health barriers    Diabetes:  (Status: Goal on Track (progressing): YES.) Lab Results  Component Value Date   HGBA1C 7.1 (A) 08/22/2021  Assessed patient's understanding of A1c goal: <7% Reviewed medications with patient and discussed importance of medication adherence;        Discussed plans with patient for ongoing care management follow up and provided patient with direct contact information for care management team;      Reviewed scheduled/upcoming provider appointments including: 01/22/22 with BSW and 02/19/22 with PCP           Review of patient status, including review of consultants reports, relevant laboratory and other test results, and medications completed;          Hypertension: (Status: Goal Not Met.) Last practice recorded BP readings:  BP Readings from Last 3 Encounters:  01/10/22 (!) 169/89  08/22/21 (!) 150/91  06/12/21 124/86  Most recent eGFR/CrCl:  Lab Results  Component Value Date   EGFR 75 08/22/2021    No components found for: "CRCL"  Evaluation of current treatment plan related to hypertension self management and patient's adherence to plan as established by provider;   Reviewed medications with patient and discussed importance of compliance and recent medication changes;  Reviewed scheduled/upcoming provider appointments including: 01/22/22 with BSW and 02/19/22 with PCP Assessed social determinant of health barriers;  Advised patient to take all of her medications to her PCP appointment Advised patient to contact [facility] member services to request DME-BP monitor with voice reading Advised  patient to take BP medication prior to her provider appointment  Patient Goals/Self-Care Activities: Patient will self administer medications as prescribed Patient will attend all scheduled provider appointments Patient will call pharmacy for medication refills Patient will call provider office for new concerns or questions Patient will work with Care Guide for financial and food resources Patient will work with MM Pharmacist for medication management

## 2022-02-14 ENCOUNTER — Other Ambulatory Visit: Payer: Medicaid Other | Admitting: *Deleted

## 2022-02-14 NOTE — Patient Outreach (Signed)
Care Coordination  02/14/2022  Lisa Olson 1961-03-05 [id number]  Successful outreach with [name]. However, she is unable to keep this telephone appointment today and request to reschedule. A new appointment was made for 02/16/22 @ 11:15 am. Patient agreed to new date and time.   Estanislado Emms RN, BSN [place]  [facility]

## 2022-02-16 ENCOUNTER — Other Ambulatory Visit: Payer: Medicaid Other | Admitting: *Deleted

## 2022-02-16 NOTE — Patient Outreach (Signed)
  Medicaid Managed Care   Unsuccessful Attempt Note   02/16/2022 Name: Lisa Olson MRN: [id number] DOB: 1960-08-26  Referred by: Marcine Matar, MD Reason for referral : High Risk Managed Medicaid (Unsuccessful RNCM follow up telephone outreach)   An unsuccessful telephone outreach was attempted today. The patient was referred to the case management team for assistance with care management and care coordination.    Follow Up Plan: A HIPAA compliant phone message was left for the patient providing contact information and requesting a return call. and The Managed Medicaid care management team will reach out to the patient again over the next 14 days.    Estanislado Emms RN, BSN [place]  [facility]

## 2022-02-16 NOTE — Patient Instructions (Signed)
Visit Information  [name]  - as a part of your Medicaid benefit, you are eligible for care management and care coordination services at no cost or copay. I was unable to reach you by phone today but would be happy to help you with your health related needs. Please feel free to call me @  [phone].   A member of the Managed Medicaid care management team will reach out to you again over the next 14 days.   Estanislado Emms RN, BSN [place]  [facility]

## 2022-02-19 ENCOUNTER — Ambulatory Visit: Payer: Medicaid Other | Admitting: Internal Medicine

## 2022-02-27 ENCOUNTER — Other Ambulatory Visit: Payer: Medicaid Other | Admitting: *Deleted

## 2022-02-27 NOTE — Patient Outreach (Cosign Needed)
  Medicaid Managed Care   Unsuccessful Attempt Note   02/27/2022 Name: Joselyn Edling MRN: [id number] DOB: 09/22/1960  Referred by: Marcine Matar, MD Reason for referral : High Risk Managed Medicaid (Unsuccessful RNCM follow up outreach)   Third unsuccessful telephone outreach was attempted today. The patient was referred to the case management team for assistance with care management and care coordination. The patient's primary care provider has been notified of our unsuccessful attempts to make or maintain contact with the patient. The care management team is pleased to engage with this patient at any time in the future should he/she be interested in assistance from the care management team.    Follow Up Plan: The  Patient has been provided with contact information for the Managed Medicaid care management team and has been advised to call with any health related questions or concerns. and The Managed Medicaid care management team is available to follow up with the patient after provider conversation with the patient regarding recommendation for care management engagement and subsequent re-referral to the care management team.     Estanislado Emms RN, BSN [place]  [facility] RN Care Coordinator

## 2022-04-10 ENCOUNTER — Ambulatory Visit: Payer: Medicaid Other | Admitting: Podiatry

## 2022-04-19 ENCOUNTER — Telehealth: Payer: Self-pay | Admitting: Emergency Medicine

## 2022-04-19 NOTE — Telephone Encounter (Signed)
Copied from [place] [phone]. Topic: Appointment Scheduling - Scheduling Inquiry for Clinic >> Apr 19, 2022  9:56 AM Erskine Squibb wrote: Reason for CRM: Veatrice Kells with the patients insurance company called in stating it is time for the patients comprehensive HBA1C updated blood work to be done. The patient has been scheduled for the 1st available appt but that is not until May and she has been put on the wait list as she is hoping to be seen sooner. Please assist patient further.

## 2022-05-22 ENCOUNTER — Encounter: Payer: Self-pay | Admitting: Internal Medicine

## 2022-05-22 ENCOUNTER — Ambulatory Visit: Payer: Medicaid Other | Attending: Internal Medicine | Admitting: Internal Medicine

## 2022-05-22 VITALS — BP 149/87 | HR 74 | Temp 97.9°F | Ht 67.0 in | Wt 215.0 lb

## 2022-05-22 DIAGNOSIS — E669 Obesity, unspecified: Secondary | ICD-10-CM

## 2022-05-22 DIAGNOSIS — E1159 Type 2 diabetes mellitus with other circulatory complications: Secondary | ICD-10-CM | POA: Diagnosis not present

## 2022-05-22 DIAGNOSIS — E113519 Type 2 diabetes mellitus with proliferative diabetic retinopathy with macular edema, unspecified eye: Secondary | ICD-10-CM

## 2022-05-22 DIAGNOSIS — I2510 Atherosclerotic heart disease of native coronary artery without angina pectoris: Secondary | ICD-10-CM

## 2022-05-22 DIAGNOSIS — I152 Hypertension secondary to endocrine disorders: Secondary | ICD-10-CM | POA: Diagnosis not present

## 2022-05-22 LAB — GLUCOSE, POCT (MANUAL RESULT ENTRY): POC Glucose: 91 mg/dl (ref 70–99)

## 2022-05-22 LAB — POCT GLYCOSYLATED HEMOGLOBIN (HGB A1C): HbA1c, POC (controlled diabetic range): 6.9 % (ref 0.0–7.0)

## 2022-05-22 MED ORDER — CARVEDILOL 12.5 MG PO TABS: 12.5000 mg | ORAL_TABLET | Freq: Two times a day (BID) | ORAL | 3 refills | Status: DC

## 2022-05-22 MED ORDER — CARVEDILOL 12.5 MG PO TABS: 6.2500 mg | ORAL_TABLET | Freq: Two times a day (BID) | ORAL | 3 refills | Status: DC

## 2022-05-22 NOTE — Progress Notes (Signed)
Patient ID: Lisa Olson, female    DOB: 1960-04-04  MRN: [id number]  CC: Diabetes (DM f/u. Med refill. /No questions / concerns/No o flu vax)   Subjective: Lisa Olson is a 62 y.o. 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  According to our scale she is down 13 lbs since I last saw 08/2021.  Still walking 3 days up and down a hill close to her house Doing well with eating habits Seen at [facility] about 3 months ago in [place] to see if they can do anything to help improve her vision.  She states they were not able to do anything further for her.  Was told that she has diabetic retinopathy and no glaucoma.  HTN/CAD/HL: Not on aspirin secondary to prior history of retinal hemorrhage. Currently on carvedilol 6.25 mg twice a day, lisinopril 30 mg daily, furosemide 20 mg daily, Crestor 10 mg daily and Imdur 30 mg daily.  Would like to have a talking BP device due to being legally blind -No CP/SOB/LE edema.  Wears compression socks  Anemia:  stable H/H on last CBC.  Denies any dizziness.Marland Kitchen    HM:  reports having update COVID booster at [facility] in [place].   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, associated with type 2 diabetes mellitus ([place]) 05/22/2018   Nuclear sclerotic cataract of both eyes 02/11/2018   Retinal edema 02/11/2018   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 02/11/2018   Vitreous hemorrhage, right eye ([place]) 02/11/2018   Legal blindness 07/25/2017   Type 2 diabetes mellitus with retinopathy of both eyes, with long-term current use of insulin ([place]) 02/14/2016   Essential hypertension 02/14/2016   Disorder of skin or subcutaneous tissue 04/24/2013   Onychomycosis due to dermatophyte 03/25/2013   Hallux valgus, acquired 03/25/2013     Current Outpatient Medications on File Prior to Visit  Medication Sig Dispense Refill   Accu-Chek Softclix Lancets lancets Use as instructed 3 times a day  before meals 100 each 12   Ascorbic Acid (VITAMIN C ER PO) Take by mouth.     Blood Glucose Monitoring Suppl (ACCU-CHEK GUIDE ME) w/Device KIT Use to check blood sugar once daily.  E11.69 1 kit 0   Blood Glucose Monitoring Suppl (ACCU-CHEK GUIDE) w/Device KIT Use as directed 3 times a day 1 kit 0   carvedilol (COREG) 6.25 MG tablet Take 1 tablet (6.25 mg total) by mouth 2 (two) times daily with a meal. 60 tablet 6   ferrous sulfate 325 (65 FE) MG tablet Take 1 tablet (325 mg total) by mouth daily with breakfast. 100 tablet 3   furosemide (LASIX) 20 MG tablet Take 1 tablet (20 mg total) by mouth daily. Stop Hydrochlorothiazide 90 tablet 2   glimepiride (AMARYL) 4 MG tablet Take 1 tablet (4 mg total) by  mouth 2 (two) times daily. 180 tablet 3   glucose blood (ACCU-CHEK GUIDE) test strip Use as instructed 3 times a day 100 each 12   isosorbide mononitrate (IMDUR) 30 MG 24 hr tablet Take 1 tablet (30 mg total) by mouth daily. 90 tablet 3   latanoprost (XALATAN) 0.005 % ophthalmic solution Place 1 drop into the left eye at bedtime. 2.5 mL 7   lisinopril (ZESTRIL) 30 MG tablet Take 1 tablet (30 mg total) by mouth daily. 90 tablet 3   metFORMIN (GLUCOPHAGE) 1000 MG tablet Take 1 tablet (1,000 mg total) by mouth 2 (two) times daily with a meal. 180 tablet 3   Multiple Vitamins-Minerals (MULTIVITAMIN WITH MINERALS) tablet Take 1 tablet by mouth daily.     prednisoLONE acetate (PRED FORTE) 1 % ophthalmic suspension Place 1 drop into the right eye daily. 10 mL 11   rosuvastatin (CRESTOR) 10 MG tablet Take 1 tablet (10 mg total) by mouth daily. 90 tablet 3   timolol (TIMOPTIC) 0.5 % ophthalmic solution Place 1 drop into both eyes 2 (two) times daily. 5 mL 11   Difluprednate (DUREZOL) 0.05 % EMUL Place 1 drop into the right eye daily. (Patient not taking: Reported on 01/15/2022) 1 mL 11   DUREZOL 0.05 % EMUL Place 1 drop into the right eye 2 (two) times daily. (Patient not taking: Reported on 01/15/2022)     No current facility-administered medications on file prior to visit.    No Known Allergies  Social History   Socioeconomic History   Marital status: Single    Spouse name: Not on file   Number of children: Not on file   Years of education: Not on file   Highest education level: Not on file  Occupational History   Not on file  Tobacco Use   Smoking status: Never   Smokeless tobacco: Never  Vaping Use   Vaping Use: Never used  Substance and Sexual Activity   Alcohol use: Not on file   Drug use: No   Sexual activity: Not on file  Other Topics Concern   Not on file  Social History Narrative   Not on file   Social Determinants of Health   Financial Resource Strain: Medium Risk  (11/09/2020)   Overall Financial Resource Strain (CARDIA)    Difficulty of Paying Living Expenses: Somewhat hard  Food Insecurity: Food Insecurity Present (01/15/2022)   Hunger Vital Sign    Worried About Running Out of Food in the Last Year: Sometimes true    Ran Out of Food in the Last Year: Sometimes true  Transportation Needs: Unmet Transportation Needs (01/15/2022)   PRAPARE - Hydrologist (Medical): Yes    Lack of Transportation (Non-Medical): Yes  Physical Activity: Not on file  Stress: Not on file  Social Connections: Moderately Integrated (04/12/2021)   Social Connection  and Isolation Panel [NHANES]    Frequency of Communication with Friends and Family: More than three times a week    Frequency of Social Gatherings with Friends and Family: Once a week    Attends Religious Services: More than 4 times per year    Active Member of [employer] or Organizations: Yes    Attends Music therapist: More than 4 times per year    Marital Status: Separated  Intimate Partner Violence: Not on file    Family History  Problem Relation Age of Onset   Diabetes Mother    Diabetes Father    Hypertension Sister     Past Surgical History:  Procedure Laterality Date   EYE SURGERY     LASIK     RETINAL DETACHMENT SURGERY      ROS: Review of Systems Negative except as stated above  PHYSICAL EXAM: BP (!) 149/87 (BP Location: Left Arm, Patient Position: Sitting, Cuff Size: Large)   Pulse 74   Temp 97.9 F (36.6 C) (Oral)   Ht '5\' 7"'$  (1.702 m)   Wt 215 lb (97.5 kg)   SpO2 99%   BMI 33.67 kg/m   Wt Readings from Last 3 Encounters:  05/22/22 215 lb (97.5 kg)  01/10/22 220 lb 6.4 oz (100 kg)  08/22/21 228 lb 12.8 oz (103.8 kg)  Repeat BP 140/90  Physical Exam  General appearance - alert, well appearing, older female and in no distress.  Patient is wearing dark shades.  She has a cane that is used by the blind Mental status - normal mood,  behavior, speech, dress, motor activity, and thought processes Neck - supple, no significant adenopathy Chest - clear to auscultation, no wheezes, rales or rhonchi, symmetric air entry Heart - normal rate, regular rhythm, normal S1, S2, no murmurs, rubs, clicks or gallops Extremities - peripheral pulses normal, no pedal edema, no clubbing or cyanosis Diabetic Foot Exam - Simple   Simple Foot Form  05/22/2022  2:37 PM  Visual Inspection See comments: Yes Sensation Testing Intact to touch and monofilament testing bilaterally: Yes Pulse Check Posterior Tibialis and Dorsalis pulse intact bilaterally: Yes Comments Small callus on the tip of the right toe.         Latest Ref Rng & Units 08/22/2021    4:55 PM 04/14/2020    2:02 PM 05/25/2019    3:30 PM  CMP  Glucose 70 - 99 mg/dL 152  240  122   BUN 8 - 27 mg/dL '15  14  18   '$ Creatinine 0.57 - 1.00 mg/dL 0.87  0.84  0.91   Sodium 134 - 144 mmol/L 141  138  138   Potassium 3.5 - 5.2 mmol/L 4.1  4.3  4.2   Chloride 96 - 106 mmol/L 99  99  100   CO2 20 - 29 mmol/L '27  26  28   '$ Calcium 8.7 - 10.3 mg/dL 9.5  9.2  9.1   Total Protein 6.0 - 8.5 g/dL 8.0  7.4  7.1   Total Bilirubin 0.0 - 1.2 mg/dL 0.3  0.2  0.3   Alkaline Phos 44 - 121 IU/L 66  77  70   AST 0 - 40 IU/L '15  15  17   '$ ALT 0 - 32 IU/L '17  20  14    '$ Lipid Panel     Component Value Date/Time   CHOL 185 08/22/2021 1655   TRIG 177 (H) 08/22/2021 1655   HDL 45 08/22/2021 1655  CHOLHDL 4.1 08/22/2021 1655   CHOLHDL 4.7 05/14/2016 1607   VLDL 44 (H) 05/14/2016 1607   LDLCALC 109 (H) 08/22/2021 1655    CBC    Component Value Date/Time   WBC 5.6 08/22/2021 1655   WBC SEE NOTE 02/13/2016 1746   RBC 4.41 08/22/2021 1655   RBC CANCELED 02/13/2016 1746   HGB 11.2 08/22/2021 1655   HCT 35.2 08/22/2021 1655   PLT 307 08/22/2021 1655   MCV 80 08/22/2021 1655   MCH 25.4 (L) 08/22/2021 1655   MCH CANCELED 02/13/2016 1746   MCHC 31.8 08/22/2021 1655   MCHC CANCELED 02/13/2016  1746   RDW 13.0 08/22/2021 1655   LYMPHSABS CANCELED 02/13/2016 1746   MONOABS CANCELED 02/13/2016 1746   EOSABS CANCELED 02/13/2016 1746   BASOSABS CANCELED 02/13/2016 1746    ASSESSMENT AND PLAN:  1. Controlled type 2 diabetes mellitus with proliferative retinopathy and macular edema, without long-term current use of insulin, unspecified laterality (HCC) At goal.  Continue Amaryl 4 mg twice a day and metformin 1000 mg twice a day.  Encouraged her to continue healthy eating habits and regular exercise.  - POCT glucose (manual entry) - POCT glycosylated hemoglobin (Hb A1C)  2. Hypertension associated with diabetes ([place]) Not at goal. I recommend increasing carvedilol to 12.5 mg twice a day.  Continue isosorbide 30 mg daily, lisinopril 30 mg daily and furosemide. Will send prescription to [facility] for talking blood pressure monitor.  She would benefit from this device is given poor vision - carvedilol (COREG) 12.5 MG tablet; Take 1 tablet (12.5 mg total) by mouth 2 (two) times daily with a meal.  Dispense: 180 tablet; Refill: 3  3. Coronary artery disease involving native coronary artery of native heart without angina pectoris Stable.  Continue carvedilol and isosorbide - carvedilol (COREG) 12.5 MG tablet; Take 1 tablet (12.5 mg total) by mouth 2 (two) times daily with a meal.  Dispense: 180 tablet; Refill: 3  4. Obesity (BMI 30.0-34.9) Commended her on weight loss.  Encouraged her to continue healthy eating habits and regular exercise.    Patient was given the opportunity to ask questions.  Patient verbalized understanding of the plan and was able to repeat key elements of the plan.   This documentation was completed using Radio [employer].  Any transcriptional errors are unintentional.  Orders Placed This Encounter  Procedures   POCT glucose (manual entry)   POCT glycosylated hemoglobin (Hb A1C)     Requested Prescriptions   Pending Prescriptions  Disp Refills   carvedilol (COREG) 12.5 MG tablet 180 tablet 3    Sig: Take 0.5 tablets (6.25 mg total) by mouth 2 (two) times daily with a meal.    No follow-ups on file.  Karle Plumber, MD, FACP

## 2022-05-22 NOTE — Patient Instructions (Signed)
Your blood pressure is not at goal.  We have increased the carvedilol to 12.5 mg twice a day.

## 2022-06-22 ENCOUNTER — Encounter: Payer: Self-pay | Admitting: Podiatry

## 2022-06-22 ENCOUNTER — Ambulatory Visit (INDEPENDENT_AMBULATORY_CARE_PROVIDER_SITE_OTHER): Payer: Medicaid Other | Admitting: Podiatry

## 2022-06-22 DIAGNOSIS — M79674 Pain in right toe(s): Secondary | ICD-10-CM | POA: Diagnosis not present

## 2022-06-22 DIAGNOSIS — M79675 Pain in left toe(s): Secondary | ICD-10-CM | POA: Diagnosis not present

## 2022-06-22 DIAGNOSIS — B351 Tinea unguium: Secondary | ICD-10-CM | POA: Diagnosis not present

## 2022-06-22 DIAGNOSIS — E119 Type 2 diabetes mellitus without complications: Secondary | ICD-10-CM

## 2022-06-22 NOTE — Progress Notes (Signed)
This patient returns to my office for at risk foot care.  This patient requires this care by a professional since this patient will be at risk due to having type 2 diabetes.    This patient is unable to cut nails herself since the patient cannot reach her nails.These nails are painful walking and wearing shoes.  This patient presents for at risk foot care today. ? ?General Appearance  Alert, conversant and in no acute stress. ? ?Vascular  Dorsalis pedis and posterior tibial  pulses are  Weakly palpable  bilaterally.  Capillary return is within normal limits  bilaterally. Temperature is within normal limits  bilaterally. ? ?Neurologic  Senn-Weinstein monofilament wire test within normal limits  bilaterally. Muscle power within normal limits bilaterally. ? ?Nails Thick disfigured discolored nails with subungual debris  from hallux to fifth toes bilaterally. No evidence of bacterial infection or drainage bilaterally. ? ?Orthopedic  No limitations of motion  feet .  No crepitus or effusions noted.  No bony pathology or digital deformities noted. Mild  HAV  B/L. ? ?Skin  normotropic skin with no porokeratosis noted bilaterally.  No signs of infections or ulcers noted.    ? ?Onychomycosis  Pain in right toes  Pain in left toes ? ?Consent was obtained for treatment procedures.   Mechanical debridement of nails 1-5  bilaterally performed with a nail nipper.  Filed with dremel without incident.  ? ? ?Return office visit  3 months                    Told patient to return for periodic foot care and evaluation due to potential at risk complications. ? ? ?Jasmynn Pfalzgraf DPM  ?

## 2022-07-16 ENCOUNTER — Ambulatory Visit: Payer: Self-pay

## 2022-07-16 ENCOUNTER — Emergency Department (HOSPITAL_COMMUNITY)
Admission: EM | Admit: 2022-07-16 | Discharge: 2022-07-16 | Disposition: A | Discharge status: Home / Self Care | Payer: Medicaid Other | Attending: Emergency Medicine | Admitting: Emergency Medicine

## 2022-07-16 ENCOUNTER — Emergency Department (HOSPITAL_COMMUNITY): Payer: Medicaid Other

## 2022-07-16 DIAGNOSIS — R1013 Epigastric pain: Secondary | ICD-10-CM | POA: Diagnosis not present

## 2022-07-16 DIAGNOSIS — R079 Chest pain, unspecified: Secondary | ICD-10-CM | POA: Diagnosis not present

## 2022-07-16 DIAGNOSIS — Z7984 Long term (current) use of oral hypoglycemic drugs: Secondary | ICD-10-CM | POA: Insufficient documentation

## 2022-07-16 DIAGNOSIS — I10 Essential (primary) hypertension: Secondary | ICD-10-CM

## 2022-07-16 DIAGNOSIS — R0789 Other chest pain: Secondary | ICD-10-CM | POA: Diagnosis not present

## 2022-07-16 LAB — COMPREHENSIVE METABOLIC PANEL
ALT: 19 U/L (ref 0–44)
AST: 19 U/L (ref 15–41)
Albumin: 3.3 g/dL — ABNORMAL LOW (ref 3.5–5.0)
Alkaline Phosphatase: 48 U/L (ref 38–126)
Anion gap: 9 (ref 5–15)
BUN: 15 mg/dL (ref 8–23)
CO2: 27 mmol/L (ref 22–32)
Calcium: 8.8 mg/dL — ABNORMAL LOW (ref 8.9–10.3)
Chloride: 102 mmol/L (ref 98–111)
Creatinine, Ser: 0.92 mg/dL (ref 0.44–1.00)
GFR, Estimated: >60 mL/min (ref >60)
Glucose, Bld: 149 mg/dL — ABNORMAL HIGH (ref 70–99)
Potassium: 4.1 mmol/L (ref 3.5–5.1)
Sodium: 138 mmol/L (ref 135–145)
Total Bilirubin: 0.5 mg/dL (ref 0.3–1.2)
Total Protein: 6.7 g/dL (ref 6.5–8.1)

## 2022-07-16 LAB — CBC WITH DIFFERENTIAL/PLATELET
Abs Immature Granulocytes: 0.02 10*3/uL (ref 0.00–0.07)
Basophils Absolute: 0 10*3/uL (ref 0.0–0.1)
Basophils Relative: 1 %
Eosinophils Absolute: 0.1 10*3/uL (ref 0.0–0.5)
Eosinophils Relative: 1 %
HCT: 32.2 % — ABNORMAL LOW (ref 36.0–46.0)
Hemoglobin: 10 g/dL — ABNORMAL LOW (ref 12.0–15.0)
Immature Granulocytes: 0 %
Lymphocytes Relative: 39 %
Lymphs Abs: 2.3 10*3/uL (ref 0.7–4.0)
MCH: 25.8 pg — ABNORMAL LOW (ref 26.0–34.0)
MCHC: 31.1 g/dL (ref 30.0–36.0)
MCV: 83.2 fL (ref 80.0–100.0)
Monocytes Absolute: 0.6 10*3/uL (ref 0.1–1.0)
Monocytes Relative: 10 %
Neutro Abs: 2.9 10*3/uL (ref 1.7–7.7)
Neutrophils Relative %: 49 %
Platelets: 245 10*3/uL (ref 150–400)
RBC: 3.87 MIL/uL (ref 3.87–5.11)
RDW: 13.5 % (ref 11.5–15.5)
WBC: 5.8 10*3/uL (ref 4.0–10.5)
nRBC: 0 % (ref 0.0–0.2)

## 2022-07-16 LAB — TROPONIN I (HIGH SENSITIVITY): Troponin I (High Sensitivity): 5 ng/L (ref <18)

## 2022-07-16 NOTE — Telephone Encounter (Signed)
  Chief Complaint: HTN Symptoms: BP 200/112 Frequency: today Pertinent Negatives: Patient denies dizziness or HA  Disposition: [x] ED /[] Urgent Care (no appt availability in office) / [] Appointment(In office/virtual)/ []  [facility]/ [] Home Care/ [] Refused Recommended Disposition /[] [facility]/ []  Follow-up with PCP Additional Notes: pt calling, she was having a tooth extraction today and she was advised to go to ED for eval d/t BP 200/112, unable to have procedure done. Pt calling for advice. Advised her d/t being this late in evening and no appts available until 08/01/22, also UC wouldn't see her d/t elevated BP advised pt ED was best option for this evening. Pt states to let her PCP know and she was keep [place] updated. She reports she did take her BP meds this morning and has not missed any doses.   Reason for Disposition  [1] Systolic BP  >= 200 OR Diastolic >= 120 AND [2] having NO cardiac or neurologic symptoms  Answer Assessment - Initial Assessment Questions 1. BLOOD PRESSURE: "What is the blood pressure?" "Did you take at least two measurements 5 minutes apart?"     200/112 2. ONSET: "When did you take your blood pressure?"     Today  3. HOW: "How did you take your blood pressure?" (e.g., automatic home BP monitor, visiting nurse)     Dental office  4. HISTORY: "Do you have a history of high blood pressure?"     yes 5. MEDICINES: "Are you taking any medicines for blood pressure?" "Have you missed any doses recently?"     Yes and no 6. OTHER SYMPTOMS: "Do you have any symptoms?" (e.g., blurred vision, chest pain, difficulty breathing, headache, weakness)     no  Protocols used: Blood Pressure - High-A-AH

## 2022-07-16 NOTE — ED Provider Notes (Signed)
[place] EMERGENCY DEPARTMENT AT [facility] Provider Note   CSN: [id number] Arrival date & time:        History {Add pertinent medical, surgical, social history, OB history to HPI:1} Chief Complaint  Patient presents with   Chest Pain   Hypertension    Lisa Olson is a 62 y.o. female.  Patient had dental procedure done today and it was noted her blood pressure was elevated.  She also stated that she had some epigastric discomfort but thought she was hungry.  Patient not having any pain now.  She has a history of hypertension   Chest Pain Hypertension Associated symptoms include chest pain.       Home Medications Prior to Admission medications   Medication Sig Start Date End Date Taking? Authorizing Provider  Accu-Chek Softclix Lancets lancets Use as instructed 3 times a day  before meals 09/08/21   Hoy Register, MD  Ascorbic Acid (VITAMIN C ER PO) Take by mouth.    [provider]  Blood Glucose Monitoring Suppl (ACCU-CHEK GUIDE ME) w/Device KIT Use to check blood sugar once daily. E11.69 05/20/20   Marcine Matar, MD  Blood Glucose Monitoring Suppl (ACCU-CHEK GUIDE) w/Device KIT Use as directed 3 times a day 09/08/21   Hoy Register, MD  carvedilol (COREG) 12.5 MG tablet Take 1 tablet (12.5 mg total) by mouth 2 (two) times daily with a meal. 05/22/22   Marcine Matar, MD  Difluprednate (DUREZOL) 0.05 % EMUL Place 1 drop into the right eye daily. Patient not taking: Reported on 01/15/2022 08/02/21   Rennis Chris, MD  DUREZOL 0.05 % EMUL Place 1 drop into the right eye 2 (two) times daily. Patient not taking: Reported on 01/15/2022 04/14/20   [provider]  ferrous sulfate 325 (65 FE) MG tablet Take 1 tablet (325 mg total) by mouth daily with breakfast. 07/29/17   Marcine Matar, MD  furosemide (LASIX) 20 MG tablet Take 1 tablet (20 mg total) by mouth daily. Stop Hydrochlorothiazide 08/23/21   Marcine Matar, MD  glimepiride (AMARYL) 4  MG tablet Take 1 tablet (4 mg total) by mouth 2 (two) times daily. 08/23/21   Marcine Matar, MD  glucose blood (ACCU-CHEK GUIDE) test strip Use as instructed 3 times a day 09/08/21   Hoy Register, MD  isosorbide mononitrate (IMDUR) 30 MG 24 hr tablet Take 1 tablet (30 mg total) by mouth daily. 08/23/21   Marcine Matar, MD  latanoprost (XALATAN) 0.005 % ophthalmic solution Place 1 drop into the left eye at bedtime. 08/02/21 08/02/22  Rennis Chris, MD  lisinopril (ZESTRIL) 30 MG tablet Take 1 tablet (30 mg total) by mouth daily. 08/23/21   Marcine Matar, MD  metFORMIN (GLUCOPHAGE) 1000 MG tablet Take 1 tablet (1,000 mg total) by mouth 2 (two) times daily with a meal. 08/23/21   Marcine Matar, MD  Multiple Vitamins-Minerals (MULTIVITAMIN WITH MINERALS) tablet Take 1 tablet by mouth daily.    [provider]  prednisoLONE acetate (PRED FORTE) 1 % ophthalmic suspension Place 1 drop into the right eye daily. 08/07/21   Rennis Chris, MD  rosuvastatin (CRESTOR) 10 MG tablet Take 1 tablet (10 mg total) by mouth daily. 11/09/20   Marcine Matar, MD  timolol (TIMOPTIC) 0.5 % ophthalmic solution Place 1 drop into both eyes 2 (two) times daily. 08/02/21 08/02/22  Rennis Chris, MD      Allergies    Patient has no known allergies.  Review of Systems   Review of Systems  Cardiovascular:  Positive for chest pain.    Physical Exam Updated Vital Signs BP (!) 146/51   Pulse 73   Temp 98.6 F (37 C) (Oral)   Resp 18   Ht 5\' 8"  (1.727 m)   Wt 97.5 kg   SpO2 99%   BMI 32.69 kg/m  Physical Exam  ED Results / Procedures / Treatments   Labs (all labs ordered are listed, but only abnormal results are displayed) Labs Reviewed  CBC WITH DIFFERENTIAL/PLATELET - Abnormal; Notable for the following components:      Result Value   Hemoglobin 10.0 (*)    HCT 32.2 (*)    MCH 25.8 (*)    All other components within normal limits  COMPREHENSIVE METABOLIC PANEL - Abnormal; Notable for  the following components:   Glucose, Bld 149 (*)    Calcium 8.8 (*)    Albumin 3.3 (*)    All other components within normal limits  TROPONIN I (HIGH SENSITIVITY)    EKG EKG Interpretation  Date/Time:  Monday July 16 2022 20:12:49 EDT Ventricular Rate:  76 PR Interval:  138 QRS Duration: 98 QT Interval:  381 QTC Calculation: 429 R Axis:   9 Text Interpretation: Sinus rhythm Abnormal R-wave progression, early transition Nonspecific T abnormalities, inferior leads Confirmed by Bethann Berkshire [phone]) on 07/16/2022 10:40:12 PM  Radiology DG Chest Port 1 View  Result Date: 07/16/2022 CLINICAL DATA:  Chest pain. EXAM: PORTABLE CHEST 1 VIEW COMPARISON:  None Available. FINDINGS: The heart size and mediastinal contours are within normal limits. Both lungs are clear. The visualized skeletal structures are unremarkable. IMPRESSION: No active disease. Electronically Signed   By: Aram Candela M.D.   On: 07/16/2022 21:07    Procedures Procedures  {Document cardiac monitor, telemetry assessment procedure when appropriate:1}  Medications Ordered in ED Medications - No data to display  ED Course/ Medical Decision Making/ A&P   {   Click here for ABCD2, HEART and other calculatorsREFRESH Note before signing :1}                          Medical Decision Making Amount and/or Complexity of Data Reviewed Labs: ordered. Radiology: ordered. ECG/medicine tests: ordered.   Patient with hypertension that is improved in the emergency department and atypical chest discomfort with normal troponin and no acute changes on EKG.  She will be discharged home to follow-up with PCP  {Document critical care time when appropriate:1} {Document review of labs and clinical decision tools ie heart score, Chads2Vasc2 etc:1}  {Document your independent review of radiology images, and any outside records:1} {Document your discussion with family members, caretakers, and with consultants:1} {Document social  determinants of health affecting pt's care:1} {Document your decision making why or why not admission, treatments were needed:1} Final Clinical Impression(s) / ED Diagnoses Final diagnoses:  Primary hypertension  Atypical chest pain    Rx / DC Orders ED Discharge Orders     None

## 2022-07-16 NOTE — Discharge Instructions (Signed)
Follow-up with your family doctor next week for recheck.  Return if problems 

## 2022-07-16 NOTE — ED Triage Notes (Signed)
[place] [facility] [place]. Pt states she began having chest pain and hunger pain. Pt states she ate lunch today but not dinner. EKG unremarkable with EMS. Pt is blind. Pt was supposed to have dental procedure done today but was cancelled due to pt being hypertensive at dentists office.   EMS Vitals: 176/68 22 L hand 70 HR 18 RR 98% RA 182 CBG

## 2022-07-17 NOTE — Telephone Encounter (Signed)
Patient seen in the ED on 06/26/2022

## 2022-07-19 ENCOUNTER — Ambulatory Visit: Payer: Self-pay

## 2022-07-19 NOTE — Telephone Encounter (Signed)
  Chief Complaint: HTN Symptoms: BP 225/112, chest pain but went away, HA Frequency: today but been elevated for several days Pertinent Negatives: NA Disposition: [] ED /[x] Urgent Care (no appt availability in office) / [] Appointment(In office/virtual)/ []  [facility]/ [] Home Care/ [] Refused Recommended Disposition /[] [facility]/ []  Follow-up with PCP Additional Notes: pt went to ED on 07/16/22, was observed for a while and was told they were going to prescribe her something for HTN but didn't. Pt states BP still elevated and had chest pain earlier but has went away. Pt is taking meds as prescribed. No appts until 08/02/22, advised UC and pt will call transportation to see if they can take her tomorrow at 1630 when appt was scheduled. If unable to make it to call UC to cancel. Pt verbalized understanding.   Reason for Disposition  [1] Systolic BP  >= 200 OR Diastolic >= 120 AND [2] having NO cardiac or neurologic symptoms  Answer Assessment - Initial Assessment Questions 1. BLOOD PRESSURE: "What is the blood pressure?" "Did you take at least two measurements 5 minutes apart?"     225/112 2. ONSET: "When did you take your blood pressure?"     today 3. HOW: "How did you take your blood pressure?" (e.g., automatic home BP monitor, visiting nurse)     Home  4. HISTORY: "Do you have a history of high blood pressure?"     yes 5. MEDICINES: "Are you taking any medicines for blood pressure?" "Have you missed any doses recently?"     Yes and no 6. OTHER SYMPTOMS: "Do you have any symptoms?" (e.g., blurred vision, chest pain, difficulty breathing, headache, weakness)     Chest pain  Protocols used: Blood Pressure - High-A-AH

## 2022-07-19 NOTE — Telephone Encounter (Signed)
Noted  

## 2022-07-20 ENCOUNTER — Ambulatory Visit (HOSPITAL_COMMUNITY): Payer: Self-pay

## 2022-07-23 ENCOUNTER — Ambulatory Visit (HOSPITAL_COMMUNITY)
Admission: RE | Admit: 2022-07-23 | Discharge: 2022-07-23 | Disposition: A | Discharge status: Home / Self Care | Payer: Medicaid Other | Source: Ambulatory Visit | Attending: Emergency Medicine | Admitting: Emergency Medicine

## 2022-07-23 ENCOUNTER — Ambulatory Visit (HOSPITAL_COMMUNITY): Payer: Medicaid Other

## 2022-07-23 ENCOUNTER — Encounter (HOSPITAL_COMMUNITY): Payer: Self-pay

## 2022-07-23 VITALS — BP 164/89 | HR 86 | Temp 99.2°F | Resp 18

## 2022-07-23 DIAGNOSIS — I10 Essential (primary) hypertension: Secondary | ICD-10-CM | POA: Diagnosis not present

## 2022-07-23 DIAGNOSIS — M25561 Pain in right knee: Secondary | ICD-10-CM

## 2022-07-23 MED ORDER — IBUPROFEN 800 MG PO TABS: 800.0000 mg | ORAL_TABLET | Freq: Three times a day (TID) | ORAL | 0 refills | Status: AC

## 2022-07-23 NOTE — Discharge Instructions (Addendum)
Blood pressure in triage 164/89, while elevated this is not emergent  EKG shows heart is beating in a normal pace and rhythm, compared to EKG completed on 07/16/2022, no changes  Please schedule follow-up appointment in 2 to 3 weeks with your primary doctor for reevaluation and further management of your blood pressure  Only check blood pressure once daily and record, only take when you are in a calm quiet environment, and check blood pressure after taking your medications  Please begin taking your higher dose of carvedilol as directed by your primary doctor, this medicine was increased to help bring your blood pressure down  Very low suspicion of injury to your bone, tendons or ligaments, you most likely will experience soreness over the next week, you may take ibuprofen every 8 hours in addition to Tylenol, may follow-up with urgent care as needed

## 2022-07-23 NOTE — ED Provider Notes (Signed)
[facility]    CSN: [id number] Arrival date & time: 07/23/22  8295      History   Chief Complaint Chief Complaint  Patient presents with   Fall   Hypertension    HPI Lisa Olson is a 62 y.o. female.   Patient presents for evaluation of elevated blood pressure this morning, reading at 209/94.  Endorses blood pressure has been elevated over the past week, first noticed at a dental appointment, has been evaluated in the emergency department.  Takes lisinopril and carvedilol daily, carvedilol dosage recently increased but has not started as she has not run out of current pills.  Denies dizziness, lightheadedness, chest pain, shortness of breath, visual changes, memory or speech changes or weakness.  Endorses that she tripped and fell on her way to urgent care visit, landing on the right knee, endorses soreness, able to bear weight.  Has not attempted treatment.  Legally blind, using cane.      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 [facility])     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, associated with type 2 diabetes mellitus (HCC) 05/22/2018   Nuclear sclerotic cataract of both eyes 02/11/2018   Retinal edema 02/11/2018   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      Home Medications    Prior to Admission medications   Medication Sig Start Date End Date Taking? Authorizing Provider  Accu-Chek Softclix Lancets lancets Use as instructed 3 times a day  before meals 09/08/21   Hoy Register, MD  Ascorbic Acid (VITAMIN C ER PO) Take by mouth.    [provider]  Blood Glucose Monitoring Suppl (ACCU-CHEK GUIDE ME) w/Device KIT Use to check blood sugar once daily. E11.69 05/20/20   Marcine Matar, MD  Blood Glucose Monitoring Suppl (ACCU-CHEK GUIDE) w/Device KIT Use as directed 3 times a day 09/08/21   Hoy Register, MD  carvedilol (COREG) 12.5 MG tablet Take 1 tablet (12.5 mg total) by mouth 2 (two) times daily with a meal. 05/22/22   Marcine Matar, MD  Difluprednate (DUREZOL) 0.05 % EMUL Place 1 drop into the right eye daily. Patient not taking: Reported on 01/15/2022 08/02/21   Rennis Chris,  MD  DUREZOL 0.05 % EMUL Place 1 drop into the right eye 2 (two) times daily. Patient not taking: Reported on 01/15/2022 04/14/20   [provider]  ferrous sulfate 325 (65 FE) MG tablet Take 1 tablet (325 mg total) by mouth daily with breakfast. 07/29/17   Marcine Matar, MD  furosemide (LASIX) 20 MG tablet Take 1 tablet (20 mg total) by mouth daily. Stop Hydrochlorothiazide 08/23/21   Marcine Matar, MD  glimepiride (AMARYL) 4 MG tablet Take 1 tablet (4 mg  total) by mouth 2 (two) times daily. 08/23/21   Marcine Matar, MD  glucose blood (ACCU-CHEK GUIDE) test strip Use as instructed 3 times a day 09/08/21   Hoy Register, MD  isosorbide mononitrate (IMDUR) 30 MG 24 hr tablet Take 1 tablet (30 mg total) by mouth daily. 08/23/21   Marcine Matar, MD  latanoprost (XALATAN) 0.005 % ophthalmic solution Place 1 drop into the left eye at bedtime. 08/02/21 08/02/22  Rennis Chris, MD  lisinopril (ZESTRIL) 30 MG tablet Take 1 tablet (30 mg total) by mouth daily. 08/23/21   Marcine Matar, MD  metFORMIN (GLUCOPHAGE) 1000 MG tablet Take 1 tablet (1,000 mg total) by mouth 2 (two) times daily with a meal. 08/23/21   Marcine Matar, MD  Multiple Vitamins-Minerals (MULTIVITAMIN WITH MINERALS) tablet Take 1 tablet by mouth daily.    [provider]  prednisoLONE acetate (PRED FORTE) 1 % ophthalmic suspension Place 1 drop into the right eye daily. 08/07/21   Rennis Chris, MD  rosuvastatin (CRESTOR) 10 MG tablet Take 1 tablet (10 mg total) by mouth daily. 11/09/20   Marcine Matar, MD  timolol (TIMOPTIC) 0.5 % ophthalmic solution Place 1 drop into both eyes 2 (two) times daily. 08/02/21 08/02/22  Rennis Chris, MD    Family History Family History  Problem Relation Age of Onset   Diabetes Mother    Diabetes Father    Hypertension Sister     Social History Social History   Tobacco Use   Smoking status: Never   Smokeless tobacco: Never  Vaping Use   Vaping Use: Never used  Substance Use Topics   Drug use: No     Allergies   Patient has no known allergies.   Review of Systems Review of Systems  Constitutional: Negative.   HENT: Negative.    Respiratory: Negative.    Cardiovascular: Negative.   Neurological: Negative.      Physical Exam Triage Vital Signs ED Triage Vitals [07/23/22 0844]  Enc Vitals Group     BP (!) 164/89     Pulse Rate 86     Resp 18     Temp 99.2 F (37.3 C)     Temp Source Oral     SpO2 98 %      Weight      Height      Head Circumference      Peak Flow      Pain Score 6     Pain Loc      Pain Edu?      Excl. in GC?    No data found.  Updated Vital Signs BP (!) 164/89 (BP Location: Left Arm)   Pulse 86   Temp 99.2 F (37.3 C) (Oral)   Resp 18   SpO2 98%   Visual Acuity Right Eye Distance:   Left Eye Distance:   Bilateral Distance:    Right Eye Near:   Left Eye Near:  Bilateral Near:     Physical Exam Constitutional:      Appearance: Normal appearance.  Eyes:     Extraocular Movements: Extraocular movements intact.  Cardiovascular:     Rate and Rhythm: Normal rate and regular rhythm.     Pulses: Normal pulses.     Heart sounds: Normal heart sounds.  Pulmonary:     Effort: Pulmonary effort is normal.     Breath sounds: Normal breath sounds.  Musculoskeletal:     Comments: Unable to reproduce tenderness, no swelling, ecchymosis or deformity present to the right knee, able to bear weight, the range of motion complete, 2+ brachial pulse  Neurological:     Mental Status: She is alert.      UC Treatments / Results  Labs (all labs ordered are listed, but only abnormal results are displayed) Labs Reviewed - No data to display  EKG   Radiology No results found.  Procedures Procedures (including critical care time)  Medications Ordered in UC Medications - No data to display  Initial Impression / Assessment and Plan / UC Course  I have reviewed the triage vital signs and the nursing notes.  Pertinent labs & imaging results that were available during my care of the patient were reviewed by me and considered in my medical decision making (see chart for details).  Elevated blood pressure reading in office with diagnosis of hypertension, acute pain of right knee  Blood pressure in triage 164/89, while elevated nonemergent, patient has no signs or symptoms of hypertensive urgency, stable for outpatient management, EKG shows normal sinus rhythm, advised  patient taking medication at elevated dosage as directed by her PCP, advise follow-up with PCP in 2 to 3 weeks, may follow-up with urgent care for any signs of hypertensive urgency  No abnormalities to the right knee, low suspicion for bone involvement therefore x-ray deferred, prescribed ibuprofen 800 mg, may take an addition to Tylenol as well as complete RICE for supportive care with follow-up as needed  Final Clinical Impressions(s) / UC Diagnoses   Final diagnoses:  None     Discharge Instructions      Blood pressure in triage 164/89, while elevated this is not emergent  EKG shows heart is beating in a normal pace and rhythm, compared to EKG completed on 07/16/2022, no changes  Please schedule follow-up appointment with your primary doctor for reevaluation and further management of your blood pressure  Only check blood pressure once daily and record, only take when you are in a calm quiet environment, and check blood pressure after taking your medications  Continue to take your daily medications as prescribed   ED Prescriptions   None    PDMP not reviewed this encounter.   Valinda Hoar, [place] 07/23/22 [phone]

## 2022-07-23 NOTE — ED Triage Notes (Signed)
Pt states her b/p has been elevated on her home machine. States on her way here she tripped and fell on rt knee. Denies taking meds for pain or her b/p. On her machine during triage her b/p read 194/81 and my reading was 164/89.

## 2022-07-31 ENCOUNTER — Encounter (INDEPENDENT_AMBULATORY_CARE_PROVIDER_SITE_OTHER): Payer: Medicaid Other | Admitting: Ophthalmology

## 2022-07-31 ENCOUNTER — Ambulatory Visit: Payer: Medicaid Other | Admitting: Internal Medicine

## 2022-07-31 DIAGNOSIS — Z7984 Long term (current) use of oral hypoglycemic drugs: Secondary | ICD-10-CM

## 2022-07-31 DIAGNOSIS — H25813 Combined forms of age-related cataract, bilateral: Secondary | ICD-10-CM

## 2022-07-31 DIAGNOSIS — H35341 Macular cyst, hole, or pseudohole, right eye: Secondary | ICD-10-CM

## 2022-07-31 DIAGNOSIS — H3341 Traction detachment of retina, right eye: Secondary | ICD-10-CM

## 2022-07-31 DIAGNOSIS — H21541 Posterior synechiae (iris), right eye: Secondary | ICD-10-CM

## 2022-07-31 DIAGNOSIS — H40053 Ocular hypertension, bilateral: Secondary | ICD-10-CM

## 2022-07-31 DIAGNOSIS — E113552 Type 2 diabetes mellitus with stable proliferative diabetic retinopathy, left eye: Secondary | ICD-10-CM

## 2022-07-31 DIAGNOSIS — E113541 Type 2 diabetes mellitus with proliferative diabetic retinopathy with combined traction retinal detachment and rhegmatogenous retinal detachment, right eye: Secondary | ICD-10-CM

## 2022-08-16 ENCOUNTER — Ambulatory Visit (HOSPITAL_BASED_OUTPATIENT_CLINIC_OR_DEPARTMENT_OTHER): Payer: Medicaid Other | Admitting: Cardiology

## 2022-08-28 ENCOUNTER — Other Ambulatory Visit: Payer: Self-pay | Admitting: Internal Medicine

## 2022-08-28 DIAGNOSIS — Z1231 Encounter for screening mammogram for malignant neoplasm of breast: Secondary | ICD-10-CM

## 2022-09-13 NOTE — Progress Notes (Signed)
[facility] - Clinic Note  09/19/2022     CHIEF COMPLAINT Patient presents for Retina Follow Up   HISTORY OF PRESENT ILLNESS: Lisa Olson is a 62 y.o. female who presents to the clinic today for:   HPI     Retina Follow Up   Patient presents with  Diabetic Retinopathy.  In both eyes.  This started 1 year ago.  I, the attending physician,  performed the HPI with the patient and updated documentation appropriately.        Comments   Patient here for 1 year retina follow up for PDR OU/mac hole OD. Patient states vision doing the same. No eye pain. Not using drops.      Last edited by Rennis Chris, MD on 09/19/2022 11:46 PM.    Pt states there has been no changes to her vision, she states she saw a specialist a couple times in [facility] and they told her she did not have glaucoma, she has been taken off all her drops for pressure, she is not using any drops right now   Referring physician: Marcine Matar, MD [address] [place],  [place] [address]  HISTORICAL INFORMATION:   Selected notes from the MEDICAL RECORD NUMBER Referred by Dr. Fabian Sharp for DM exam LEE: 05.22.19 (S. Groat) [BCVA: OD: HM OS: HM]  Ocular Hx-Cataract OU, Diabetic Retinopathy OU, Retinal Vein Occlusion PMH-DM (last A1C 7.2, taking Metformin), HTN   CURRENT MEDICATIONS: Current Outpatient Medications (Ophthalmic Drugs)  Medication Sig   prednisoLONE acetate (PRED FORTE) 1 % ophthalmic suspension Place 1 drop into the right eye daily.   Difluprednate (DUREZOL) 0.05 % EMUL Place 1 drop into the right eye daily. (Patient not taking: Reported on 01/15/2022)   DUREZOL 0.05 % EMUL Place 1 drop into the right eye 2 (two) times daily. (Patient not taking: Reported on 01/15/2022)   No current facility-administered medications for this visit. (Ophthalmic Drugs)   Current Outpatient Medications (Other)  Medication Sig   Accu-Chek Softclix Lancets lancets Use as instructed 3  times a day  before meals   Ascorbic Acid (VITAMIN C ER PO) Take by mouth.   Blood Glucose Monitoring Suppl (ACCU-CHEK GUIDE ME) w/Device KIT Use to check blood sugar once daily. E11.69   Blood Glucose Monitoring Suppl (ACCU-CHEK GUIDE) w/Device KIT Use as directed 3 times a day   carvedilol (COREG) 12.5 MG tablet Take 1 tablet (12.5 mg total) by mouth 2 (two) times daily with a meal.   ferrous sulfate 325 (65 FE) MG tablet Take 1 tablet (325 mg total) by mouth daily with breakfast.   furosemide (LASIX) 20 MG tablet Take 1 tablet (20 mg total) by mouth daily. Stop Hydrochlorothiazide   glimepiride (AMARYL) 4 MG tablet Take 1 tablet (4 mg total) by mouth 2 (two) times daily.   glucose blood (ACCU-CHEK GUIDE) test strip Use as instructed 3 times a day   ibuprofen (ADVIL) 800 MG tablet Take 1 tablet (800 mg total) by mouth 3 (three) times daily.   isosorbide mononitrate (IMDUR) 30 MG 24 hr tablet Take 1 tablet (30 mg total) by mouth daily.   lisinopril (ZESTRIL) 30 MG tablet Take 1 tablet (30 mg total) by mouth daily.   metFORMIN (GLUCOPHAGE) 1000 MG tablet Take 1 tablet (1,000 mg total) by mouth 2 (two) times daily with a meal.   Multiple Vitamins-Minerals (MULTIVITAMIN WITH MINERALS) tablet Take 1 tablet by mouth daily.   rosuvastatin (CRESTOR) 10  MG tablet Take 1 tablet (10 mg total) by mouth daily.   No current facility-administered medications for this visit. (Other)   REVIEW OF SYSTEMS: ROS   Positive for: Musculoskeletal, Endocrine, Eyes Negative for: Constitutional, Gastrointestinal, Neurological, Skin, Genitourinary, HENT, Cardiovascular, Respiratory, Psychiatric, Allergic/Imm, Heme/Lymph Last edited by Laddie Aquas, COA on 09/19/2022  1:02 PM.     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)     Past Surgical History:  Procedure Laterality Date   EYE SURGERY     LASIK     RETINAL DETACHMENT SURGERY      FAMILY HISTORY Family History  Problem Relation Age of Onset   Diabetes Mother    Diabetes Father    Hypertension Sister     SOCIAL HISTORY Social History   Tobacco Use   Smoking status: Never   Smokeless tobacco: Never  Vaping Use   Vaping Use: Never used  Substance Use Topics   Drug use: No       OPHTHALMIC EXAM:  Base Eye Exam     Visual Acuity (Snellen - Linear)       Right Left   Dist [place] LP CF at 6 "         Tonometry (Tonopen, 12:58 PM)       Right Left   Pressure 19 19         Pupils       Dark Light Shape React APD   Right 2 1.5 Irregular Minimal None   Left 2 1 Round Minimal None         Visual Fields (Counting fingers)       Left Right   Restrictions Total superior temporal, inferior temporal, superior nasal, inferior nasal deficiencies Total superior temporal, inferior temporal, superior nasal, inferior nasal deficiencies         Extraocular Movement       Right Left    RXT Full    -- -- --  --  --  -- -- --   -- -- --  --  --  -- -- --           Neuro/Psych     Oriented x3: Yes   Mood/Affect: Normal         Dilation     Both eyes: 1.0% Mydriacyl, 2.5% Phenylephrine @ 12:58 PM           Slit Lamp and Fundus Exam     Slit Lamp Exam       Right Left   Lids/Lashes Dermatochalasis - upper lid, mild MGD Mild Meibomian gland dysfunction   Conjunctiva/Sclera Mild Melanosis Nasal and Temporal Pinguecula, Melanosis   Cornea Mild Arcus, Mild tear film debris Mild Arcus, trace Punctate epithelial erosions   Anterior Chamber Shallow with IK touch temporally, narrow angles nasally deep and clear   Iris almost 360 Posterior synechiae with pupil size approx 2mm, anterior bowing Round and dilated, No NVI, mild anterior bowing   Lens Mature white cataract 2-3+ Nuclear sclerosis with brunescence, 2-3+  Cortical cataract, 1-2+ Posterior subcapsular cataract, Vacuoles   Anterior Vitreous no view Mild Vitreous syneresis         Fundus Exam       Right Left   Disc No view 2-3+Pallor, fibrosis, temporal PPA, Sharp rim, attenuated vessels,  no NVD   C/D Ratio  0.2   Macula No view Flat, atrophic, fibrosis and pigmented scarring temporal half of macula   Vessels No view Severe attenuation, copper wiring, AV crossing changes, no NV   Periphery No view Attached, 360 PRP, fibrosis nasal to disc, room for PRP fill-in superiorly            IMAGING AND PROCEDURES  Imaging and Procedures for @TODAY @  OCT, Retina - OU - Both Eyes       Right Eye Quality was (uninterruptable ). Findings include preretinal fibrosis (No images obtained).   Left Eye Quality was good. Central Foveal Thickness: 177. Progression has been stable. Findings include abnormal foveal contour, retinal drusen , subretinal hyper-reflective material, epiretinal membrane, intraretinal fluid, lamellar hole, outer retinal atrophy (Sub-retinal fibrosis/SRHM temporal macula caught on widefield).   Notes *Images captured and stored on drive  Diagnosis / Impression:  OD: no images obtained OS: diffuse retinal atrophy, ERM with lamellar hole -- stable  Clinical management:  See below  Abbreviations: NFP - Normal foveal profile. CME - cystoid macular edema. PED - pigment epithelial detachment. IRF - intraretinal fluid. SRF - subretinal fluid. EZ - ellipsoid zone. ERM - epiretinal membrane. ORA - outer retinal atrophy. ORT - outer retinal tubulation. SRHM - subretinal hyper-reflective material             ASSESSMENT/PLAN:    ICD-10-CM   1. 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 Macular hole of right eye  H35.341     3. Retinal detachment,  tractional, right eye  H33.41     4. Posterior synechiae of iris, right  H21.541     5. Stable proliferative diabetic retinopathy of left eye associated with type 2 diabetes mellitus (HCC)  Z61.0960     6. Bilateral ocular hypertension  H40.053     7. Combined forms of age-related cataract of both eyes  H25.813      1-4. 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  - pt with long standing history of low vision / legal blindness  - onset of vision loss ~2015 in [place] -- underwent PRP laser OD, PPV OS   - today, reports no acute change in vision OD, but prior exam showed persistent complete macular detachment with macular hole -- wolf-jaw fibrosis emanating from the disc; periphery attached with good PRP in place  - discussed findings, very guarded prognosis, and treatment options  - given longstanding VA of HM OD, unlikely that anatomic reattachment of retina will yield significantly improved vision  - discussed possibility of surgery making vision worse  - pt wishes to monitor for now  - continue Durezol qd OD -- using PRN  - f/u here in 1 year, sooner prn, to monitor for any acute changes in symptoms or exam  5. Stable PDR OS  - s/p PPV and laser PRP OS ~2015 in [place]  - significant macular atrophy OS  - no active disease at this time  - room for PRP fill-in peripherally if needed  - monitor  6. Ocular Hypertension OU  - IOP 19 OU - discussed with patient - pt is not on any drops  7. Mixed Cataract OU - The symptoms of cataract, surgical options, and treatments and risks were discussed with patient. - discussed diagnosis and progression -  discussed likelihood that CE/IOL OS may not improve visual acuity, but overall brightness of vision may improve with cataract surgery - recommend evaluation with cataract surgeon   Ophthalmic Meds Ordered this visit:  No orders of the defined types were placed in this encounter.    Return in about 1 year  (around 09/19/2023) for f/u PDR OU, DFE, OCT.  There are no Patient Instructions on file for this visit.   Explained the diagnoses, plan, and follow up with the patient and they expressed understanding.  Patient expressed understanding of the importance of proper follow up care.   This document serves as a record of services personally performed by Karie Chimera, MD, PhD. It was created on their behalf by De Blanch, an ophthalmic technician. The creation of this record is the provider's dictation and/or activities during the visit.    Electronically signed by: De Blanch, OA, 09/19/22  11:47 PM  This document serves as a record of services personally performed by Karie Chimera, MD, PhD. It was created on their behalf by Glee Arvin. Manson Passey, OA an ophthalmic technician. The creation of this record is the provider's dictation and/or activities during the visit.    Electronically signed by: Glee Arvin. Manson Passey, OA 09/19/22 11:47 PM  Karie Chimera, M.D., Ph.D. Diseases & Surgery of the Retina and Vitreous [facility]  I have reviewed the above documentation for accuracy and completeness, and I agree with the above. Karie Chimera, M.D., Ph.D. 09/19/22 11:49 PM   Abbreviations: M myopia (nearsighted); A astigmatism; H hyperopia (farsighted); P presbyopia; Mrx spectacle prescription;  CTL contact lenses; OD right eye; OS left eye; OU both eyes  XT exotropia; ET esotropia; PEK punctate epithelial keratitis; PEE punctate epithelial erosions; DES dry eye syndrome; MGD meibomian gland dysfunction; ATs artificial tears; PFAT's preservative free artificial tears; NSC nuclear sclerotic cataract; PSC posterior subcapsular cataract; ERM epi-retinal membrane; PVD posterior vitreous detachment; RD retinal detachment; DM diabetes mellitus; DR diabetic retinopathy; NPDR non-proliferative diabetic retinopathy; PDR proliferative diabetic retinopathy; CSME clinically significant  macular edema; DME diabetic macular edema; dbh dot blot hemorrhages; CWS cotton wool spot; POAG primary open angle glaucoma; C/D cup-to-disc ratio; HVF humphrey visual field; GVF goldmann visual field; OCT optical coherence tomography; IOP intraocular pressure; BRVO Branch retinal vein occlusion; CRVO central retinal vein occlusion; CRAO central retinal artery occlusion; BRAO branch retinal artery occlusion; RT retinal tear; SB scleral buckle; PPV pars plana vitrectomy; VH Vitreous hemorrhage; PRP panretinal laser photocoagulation; IVK intravitreal kenalog; VMT vitreomacular traction; MH Macular hole;  NVD neovascularization of the disc; NVE neovascularization elsewhere; AREDS age related eye disease study; ARMD age related macular degeneration; POAG primary open angle glaucoma; EBMD epithelial/anterior basement membrane dystrophy; ACIOL anterior chamber intraocular lens; IOL intraocular lens; PCIOL posterior chamber intraocular lens; Phaco/IOL phacoemulsification with intraocular lens placement; PRK photorefractive keratectomy; LASIK laser assisted in situ keratomileusis; HTN hypertension; DM diabetes mellitus; COPD chronic obstructive pulmonary disease

## 2022-09-18 ENCOUNTER — Ambulatory Visit
Admission: RE | Admit: 2022-09-18 | Discharge: 2022-09-18 | Disposition: A | Discharge status: Home / Self Care | Payer: Medicaid Other | Source: Ambulatory Visit | Attending: Internal Medicine | Admitting: Internal Medicine

## 2022-09-18 DIAGNOSIS — Z1231 Encounter for screening mammogram for malignant neoplasm of breast: Secondary | ICD-10-CM

## 2022-09-19 ENCOUNTER — Ambulatory Visit (INDEPENDENT_AMBULATORY_CARE_PROVIDER_SITE_OTHER): Payer: Medicaid Other | Admitting: Ophthalmology

## 2022-09-19 ENCOUNTER — Encounter (INDEPENDENT_AMBULATORY_CARE_PROVIDER_SITE_OTHER): Payer: Self-pay | Admitting: Ophthalmology

## 2022-09-19 DIAGNOSIS — H21541 Posterior synechiae (iris), right eye: Secondary | ICD-10-CM

## 2022-09-19 DIAGNOSIS — H25813 Combined forms of age-related cataract, bilateral: Secondary | ICD-10-CM | POA: Diagnosis not present

## 2022-09-19 DIAGNOSIS — E113552 Type 2 diabetes mellitus with stable proliferative diabetic retinopathy, left eye: Secondary | ICD-10-CM

## 2022-09-19 DIAGNOSIS — H3341 Traction detachment of retina, right eye: Secondary | ICD-10-CM

## 2022-09-19 DIAGNOSIS — H40053 Ocular hypertension, bilateral: Secondary | ICD-10-CM

## 2022-09-19 DIAGNOSIS — E113541 Type 2 diabetes mellitus with proliferative diabetic retinopathy with combined traction retinal detachment and rhegmatogenous retinal detachment, right eye: Secondary | ICD-10-CM

## 2022-09-19 DIAGNOSIS — H35341 Macular cyst, hole, or pseudohole, right eye: Secondary | ICD-10-CM

## 2022-09-19 LAB — HM DIABETES EYE EXAM

## 2022-09-21 ENCOUNTER — Ambulatory Visit: Payer: Medicaid Other | Attending: Internal Medicine | Admitting: Internal Medicine

## 2022-09-21 ENCOUNTER — Encounter: Payer: Self-pay | Admitting: Internal Medicine

## 2022-09-21 VITALS — BP 137/82 | HR 66 | Temp 98.4°F | Ht 68.0 in | Wt 210.0 lb

## 2022-09-21 DIAGNOSIS — E669 Obesity, unspecified: Secondary | ICD-10-CM

## 2022-09-21 DIAGNOSIS — I2510 Atherosclerotic heart disease of native coronary artery without angina pectoris: Secondary | ICD-10-CM | POA: Diagnosis not present

## 2022-09-21 DIAGNOSIS — Z7984 Long term (current) use of oral hypoglycemic drugs: Secondary | ICD-10-CM | POA: Diagnosis not present

## 2022-09-21 DIAGNOSIS — E119 Type 2 diabetes mellitus without complications: Secondary | ICD-10-CM | POA: Diagnosis not present

## 2022-09-21 DIAGNOSIS — I152 Hypertension secondary to endocrine disorders: Secondary | ICD-10-CM

## 2022-09-21 DIAGNOSIS — E1159 Type 2 diabetes mellitus with other circulatory complications: Secondary | ICD-10-CM | POA: Diagnosis not present

## 2022-09-21 DIAGNOSIS — D649 Anemia, unspecified: Secondary | ICD-10-CM | POA: Diagnosis not present

## 2022-09-21 DIAGNOSIS — K0889 Other specified disorders of teeth and supporting structures: Secondary | ICD-10-CM | POA: Diagnosis not present

## 2022-09-21 DIAGNOSIS — E1169 Type 2 diabetes mellitus with other specified complication: Secondary | ICD-10-CM

## 2022-09-21 LAB — POCT GLYCOSYLATED HEMOGLOBIN (HGB A1C): HbA1c, POC (controlled diabetic range): 6.2 % (ref 0.0–7.0)

## 2022-09-21 LAB — GLUCOSE, POCT (MANUAL RESULT ENTRY): POC Glucose: 126 mg/dl — AB (ref 70–99)

## 2022-09-21 MED ORDER — AMOXICILLIN 500 MG PO CAPS: 500.0000 mg | ORAL_CAPSULE | Freq: Three times a day (TID) | ORAL | 0 refills | Status: AC

## 2022-09-21 MED ORDER — ACETAMINOPHEN-CODEINE 300-30 MG PO TABS: 1.0000 | ORAL_TABLET | Freq: Three times a day (TID) | ORAL | 0 refills | Status: DC | PRN

## 2022-09-21 MED ORDER — LISINOPRIL 30 MG PO TABS: 30.0000 mg | ORAL_TABLET | Freq: Every day | ORAL | 3 refills | Status: DC

## 2022-09-21 NOTE — Progress Notes (Signed)
Patient ID: Candle Latterell, female    DOB: 1961-03-18  MRN: [id number]  CC: Diabetes (DM f/u. Med refill./Requesting antibiotics & ibuprofen for fractured tooth on R side)   Subjective: Lisa Olson is a 62 y.o. 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  No swelling of jaw. She has a fractured/impacted tooth in RT upper jaw. Has appointment with dentist 09/26/2022.  Requesting Augmentin and Ibuprofen.  Rates pain 8/10.  DM: Results for orders placed or performed in visit on 09/21/22  POCT glucose (manual entry)  Result Value Ref Range   POC Glucose 126 (A) 70 - 99 mg/dl  POCT glycosylated hemoglobin (Hb A1C)  Result Value Ref Range   Hemoglobin A1C     HbA1c POC (<> result, manual entry)     HbA1c, POC (prediabetic range)     HbA1c, POC (controlled diabetic range) 6.2 0.0 - 7.0 %  Reports compliance with metformin 1 g twice a day and Amaryl 4 mg twice a day. Checks BS 3x/day.  Occasional low BS; can feel when BS low On last visit she was down 13 pounds from the previous visit 6 months prior.  Today, she is down additional 5 lbs.  Down 18 lbs total since 12/2021.  No palpitations or feeling hot all the time.  She is up-to-date with age-appropriate cancer screenings. -still doing well with eating habits.  Reports she cut out sodas and too much rice -Still walking 3 days up and down a hill close to her house   HTN/CAD/HL:   Checks BP every other day.  Does not have log with her.  Gives range 150s/80 Took Coreg and Lisinopril already this morning.  Did not take Isosorbide as yet this a.m Currently on carvedilol 6.25 mg twice a day, lisinopril 30 mg daily, furosemide 20 mg daily, Crestor 10 mg daily and Imdur 30 mg daily. Reports taking Crestor every other  day because she feels it affects her memory when she takes every day No CP/SOB/LE edema. Wears compression socks but not today  Anemia: H/H has remained between 10-11/32-36  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, associated with type 2 diabetes mellitus (HCC) 05/22/2018   Nuclear sclerotic cataract of both eyes 02/11/2018   Retinal edema 02/11/2018   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 02/11/2018   Vitreous hemorrhage, right eye (HCC) 02/11/2018   Legal blindness 07/25/2017   Type 2 diabetes mellitus with retinopathy of both eyes, with long-term current use of insulin (HCC) 02/14/2016   Essential hypertension 02/14/2016   Disorder of skin or subcutaneous tissue 04/24/2013   Onychomycosis due to dermatophyte 03/25/2013   Hallux valgus, acquired 03/25/2013     Current  Outpatient Medications on File Prior to Visit  Medication Sig Dispense Refill   Accu-Chek Softclix Lancets lancets Use as instructed 3 times a day  before meals 100 each 12   Ascorbic Acid (VITAMIN C ER PO) Take by mouth.     Blood Glucose Monitoring Suppl (ACCU-CHEK GUIDE ME) w/Device KIT Use to check blood sugar once daily. E11.69 1 kit 0   Blood Glucose Monitoring Suppl (ACCU-CHEK GUIDE) w/Device KIT Use as directed 3 times a day 1 kit 0   carvedilol (COREG) 12.5 MG tablet Take 1  tablet (12.5 mg total) by mouth 2 (two) times daily with a meal. 180 tablet 3   ferrous sulfate 325 (65 FE) MG tablet Take 1 tablet (325 mg total) by mouth daily with breakfast. 100 tablet 3   furosemide (LASIX) 20 MG tablet Take 1 tablet (20 mg total) by mouth daily. Stop Hydrochlorothiazide 90 tablet 2   glimepiride (AMARYL) 4 MG tablet Take 1 tablet (4 mg total) by mouth 2 (two) times daily. 180 tablet 3   glucose blood (ACCU-CHEK GUIDE) test strip Use as instructed 3 times a day 100 each 12   ibuprofen (ADVIL) 800 MG tablet Take 1 tablet (800 mg total) by mouth 3 (three) times daily. 21 tablet 0   isosorbide mononitrate (IMDUR) 30 MG 24 hr tablet Take 1 tablet (30 mg total) by mouth daily. 90 tablet 3   lisinopril (ZESTRIL) 30 MG tablet Take 1 tablet (30 mg total) by mouth daily. 90 tablet 3   metFORMIN (GLUCOPHAGE) 1000 MG tablet Take 1 tablet (1,000 mg total) by mouth 2 (two) times daily with a meal. 180 tablet 3   Multiple Vitamins-Minerals (MULTIVITAMIN WITH MINERALS) tablet Take 1 tablet by mouth daily.     prednisoLONE acetate (PRED FORTE) 1 % ophthalmic suspension Place 1 drop into the right eye daily. 10 mL 11   rosuvastatin (CRESTOR) 10 MG tablet Take 1 tablet (10 mg total) by mouth daily. 90 tablet 3   Difluprednate (DUREZOL) 0.05 % EMUL Place 1 drop into the right eye daily. (Patient not taking: Reported on 01/15/2022) 1 mL 11   DUREZOL 0.05 % EMUL Place 1 drop into the right eye 2 (two) times daily. (Patient not taking: Reported on 01/15/2022)     No current facility-administered medications on file prior to visit.    No Known Allergies  Social History   Socioeconomic History   Marital status: Single    Spouse name: Not on file   Number of children: Not on file   Years of education: Not on file   Highest education level: Not on file  Occupational History   Not on file  Tobacco Use   Smoking status: Never   Smokeless tobacco: Never  Vaping Use   Vaping Use: Never used   Substance and Sexual Activity   Alcohol use: Not on file   Drug use: No   Sexual activity: Not on file  Other Topics Concern   Not on file  Social History Narrative   Not on file   Social Determinants of Health   Financial Resource Strain: Medium Risk (11/09/2020)   Overall Financial Resource Strain (CARDIA)    Difficulty of Paying Living Expenses: Somewhat hard  Food Insecurity: Food Insecurity Present (01/15/2022)   Hunger Vital Sign    Worried About Running Out of Food in the Last Year: Sometimes true    Ran Out of Food in the Last Year: Sometimes true  Transportation Needs: Unmet Transportation Needs (  01/15/2022)   PRAPARE - Administrator, Civil Service (Medical): Yes    Lack of Transportation (Non-Medical): Yes  Physical Activity: Not on file  Stress: Not on file  Social Connections: Moderately Integrated (04/12/2021)   Social Connection and Isolation Panel [NHANES]    Frequency of Communication with Friends and Family: More than three times a week    Frequency of Social Gatherings with Friends and Family: Once a week    Attends Religious Services: More than 4 times per year    Active Member of Clubs or Organizations: Yes    Attends Engineer, structural: More than 4 times per year    Marital Status: Separated  Intimate Partner Violence: Not on file    Family History  Problem Relation Age of Onset   Diabetes Mother    Diabetes Father    Hypertension Sister     Past Surgical History:  Procedure Laterality Date   EYE SURGERY     LASIK     RETINAL DETACHMENT SURGERY      ROS: Review of Systems Negative except as stated above  PHYSICAL EXAM: BP 137/82 (BP Location: Left Arm, Patient Position: Sitting, Cuff Size: Large)   Pulse 66   Temp 98.4 F (36.9 C) (Oral)   Ht 5\' 8"  (1.727 m)   Wt 210 lb (95.3 kg)   SpO2 97%   BMI 31.93 kg/m   Wt Readings from Last 3 Encounters:  09/21/22 210 lb (95.3 kg)  07/16/22 215 lb (97.5 kg)  05/22/22  215 lb (97.5 kg)  BP 138/82  Physical Exam  General appearance - alert, well appearing, older female and in no distress Mental status - normal mood, behavior, speech, dress, motor activity, and thought processes Mouth - upper 3rd molar - tender to touch, no surrounding edema/erythema Neck - supple, no significant adenopathy, no thyroid enlargement Chest - clear to auscultation, no wheezes, rales or rhonchi, symmetric air entry Heart - normal rate, regular rhythm, normal S1, S2, no murmurs, rubs, clicks or gallops Extremities - no LE edema      Latest Ref Rng & Units 07/16/2022    8:39 PM 08/22/2021    4:55 PM 04/14/2020    2:02 PM  CMP  Glucose 70 - 99 mg/dL 756  433  295   BUN 8 - 23 mg/dL 15  15  14    Creatinine 0.44 - 1.00 mg/dL 1.88  4.16  6.06   Sodium 135 - 145 mmol/L 138  141  138   Potassium 3.5 - 5.1 mmol/L 4.1  4.1  4.3   Chloride 98 - 111 mmol/L 102  99  99   CO2 22 - 32 mmol/L 27  27  26    Calcium 8.9 - 10.3 mg/dL 8.8  9.5  9.2   Total Protein 6.5 - 8.1 g/dL 6.7  8.0  7.4   Total Bilirubin 0.3 - 1.2 mg/dL 0.5  0.3  0.2   Alkaline Phos 38 - 126 U/L 48  66  77   AST 15 - 41 U/L 19  15  15    ALT 0 - 44 U/L 19  17  20     Lipid Panel     Component Value Date/Time   CHOL 185 08/22/2021 1655   TRIG 177 (H) 08/22/2021 1655   HDL 45 08/22/2021 1655   CHOLHDL 4.1 08/22/2021 1655   CHOLHDL 4.7 05/14/2016 1607   VLDL 44 (H) 05/14/2016 1607   LDLCALC 109 (H) 08/22/2021 1655  CBC    Component Value Date/Time   WBC 5.8 07/16/2022 2039   RBC 3.87 07/16/2022 2039   HGB 10.0 (L) 07/16/2022 2039   HGB 11.2 08/22/2021 1655   HCT 32.2 (L) 07/16/2022 2039   HCT 35.2 08/22/2021 1655   PLT 245 07/16/2022 2039   PLT 307 08/22/2021 1655   MCV 83.2 07/16/2022 2039   MCV 80 08/22/2021 1655   MCH 25.8 (L) 07/16/2022 2039   MCHC 31.1 07/16/2022 2039   RDW 13.5 07/16/2022 2039   RDW 13.0 08/22/2021 1655   LYMPHSABS 2.3 07/16/2022 2039   MONOABS 0.6 07/16/2022 2039   EOSABS  0.1 07/16/2022 2039   BASOSABS 0.0 07/16/2022 2039    ASSESSMENT AND PLAN: 1. Type 2 diabetes mellitus with obesity (HCC) At goal.  Continue metformin 1 g twice a day and glipizide 4 mg twice a day Commended her on weight loss.  Encouraged her to continue trying to eat healthy and continue regular exercise. - POCT glucose (manual entry) - POCT glycosylated hemoglobin (Hb A1C) - Microalbumin / creatinine urine ratio  2. Diabetes mellitus treated with oral medication (HCC)  3. Hypertension associated with type 2 diabetes mellitus (HCC) Not at goal.  She has not taken isosorbide as yet for the morning.  Continue carvedilol 12.5 mg twice a day, isosorbide 30 mg daily, lisinopril 30 mg daily - lisinopril (ZESTRIL) 30 MG tablet; Take 1 tablet (30 mg total) by mouth daily.  Dispense: 90 tablet; Refill: 3  4. Coronary artery disease involving native coronary artery of native heart without angina pectoris Stable.  Continue carvedilol and Crestor - Lipid panel  5. Chronic anemia - Iron, TIBC and Ferritin Panel - CBC  6. Pain, dental Patient to keep upcoming appointment with dentist. I have prescribed amoxicillin.  I have given a limited supply of Tylenol with codeine to use for pain.  Advised that it is a controlled substance and can cause drowsiness and constipation. [place] controlled substance reporting system reviewed. - acetaminophen-codeine (TYLENOL #3) 300-30 MG tablet; Take 1 tablet by mouth every 8 (eight) hours as needed for moderate pain.  Dispense: 15 tablet; Refill: 0     Patient was given the opportunity to ask questions.  Patient verbalized understanding of the plan and was able to repeat key elements of the plan.   This documentation was completed using Paediatric nurse.  Any transcriptional errors are unintentional.  Orders Placed This Encounter  Procedures   POCT glucose (manual entry)   POCT glycosylated hemoglobin (Hb A1C)      Requested Prescriptions   Pending Prescriptions Disp Refills   lisinopril (ZESTRIL) 30 MG tablet 90 tablet 3    Sig: Take 1 tablet (30 mg total) by mouth daily.    No follow-ups on file.  Jonah Blue, MD, FACP

## 2022-09-22 LAB — IRON,TIBC AND FERRITIN PANEL
Ferritin: 241 ng/mL — ABNORMAL HIGH (ref 15–150)
Iron Saturation: 19 % (ref 15–55)
Iron: 51 ug/dL (ref 27–139)
Total Iron Binding Capacity: 273 ug/dL (ref 250–450)
UIBC: 222 ug/dL (ref 118–369)

## 2022-09-22 LAB — CBC
Hematocrit: 34.6 % (ref 34.0–46.6)
Hemoglobin: 10.4 g/dL — ABNORMAL LOW (ref 11.1–15.9)
MCH: 25.3 pg — ABNORMAL LOW (ref 26.6–33.0)
MCHC: 30.1 g/dL — ABNORMAL LOW (ref 31.5–35.7)
MCV: 84 fL (ref 79–97)
Platelets: 279 10*3/uL (ref 150–450)
RBC: 4.11 x10E6/uL (ref 3.77–5.28)
RDW: 13 % (ref 11.7–15.4)
WBC: 6.3 10*3/uL (ref 3.4–10.8)

## 2022-09-22 LAB — LIPID PANEL
Chol/HDL Ratio: 4.7 ratio — ABNORMAL HIGH (ref 0.0–4.4)
Cholesterol, Total: 199 mg/dL (ref 100–199)
HDL: 42 mg/dL (ref >39)
LDL Chol Calc (NIH): 119 mg/dL — ABNORMAL HIGH (ref 0–99)
Triglycerides: 217 mg/dL — ABNORMAL HIGH (ref 0–149)
VLDL Cholesterol Cal: 38 mg/dL (ref 5–40)

## 2022-09-22 LAB — MICROALBUMIN / CREATININE URINE RATIO
Creatinine, Urine: 139.9 mg/dL
Microalb/Creat Ratio: 24 mg/g creat (ref 0–29)
Microalbumin, Urine: 34.1 ug/mL

## 2022-09-24 ENCOUNTER — Encounter: Payer: Self-pay | Admitting: Podiatry

## 2022-09-24 ENCOUNTER — Ambulatory Visit (INDEPENDENT_AMBULATORY_CARE_PROVIDER_SITE_OTHER): Payer: Medicaid Other | Admitting: Podiatry

## 2022-09-24 DIAGNOSIS — M79674 Pain in right toe(s): Secondary | ICD-10-CM

## 2022-09-24 DIAGNOSIS — E119 Type 2 diabetes mellitus without complications: Secondary | ICD-10-CM

## 2022-09-24 DIAGNOSIS — M79675 Pain in left toe(s): Secondary | ICD-10-CM | POA: Diagnosis not present

## 2022-09-24 DIAGNOSIS — B351 Tinea unguium: Secondary | ICD-10-CM | POA: Diagnosis not present

## 2022-09-24 NOTE — Progress Notes (Signed)
This patient returns to my office for at risk foot care.  This patient requires this care by a professional since this patient will be at risk due to having type 2 diabetes.    This patient is unable to cut nails herself since the patient cannot reach her nails.These nails are painful walking and wearing shoes.  This patient presents for at risk foot care today. ? ?General Appearance  Alert, conversant and in no acute stress. ? ?Vascular  Dorsalis pedis and posterior tibial  pulses are  Weakly palpable  bilaterally.  Capillary return is within normal limits  bilaterally. Temperature is within normal limits  bilaterally. ? ?Neurologic  Senn-Weinstein monofilament wire test within normal limits  bilaterally. Muscle power within normal limits bilaterally. ? ?Nails Thick disfigured discolored nails with subungual debris  from hallux to fifth toes bilaterally. No evidence of bacterial infection or drainage bilaterally. ? ?Orthopedic  No limitations of motion  feet .  No crepitus or effusions noted.  No bony pathology or digital deformities noted. Mild  HAV  B/L. ? ?Skin  normotropic skin with no porokeratosis noted bilaterally.  No signs of infections or ulcers noted.    ? ?Onychomycosis  Pain in right toes  Pain in left toes ? ?Consent was obtained for treatment procedures.   Mechanical debridement of nails 1-5  bilaterally performed with a nail nipper.  Filed with dremel without incident.  ? ? ?Return office visit  3 months                    Told patient to return for periodic foot care and evaluation due to potential at risk complications. ? ? ?Jaden Batchelder DPM  ?

## 2022-09-26 ENCOUNTER — Other Ambulatory Visit: Payer: Self-pay | Admitting: *Deleted

## 2022-09-26 MED ORDER — ACCU-CHEK GUIDE VI STRP: ORAL_STRIP | 12 refills | Status: DC

## 2022-09-26 NOTE — Telephone Encounter (Signed)
Patient called for lab results and informed- she also request RF on her test strips

## 2022-09-26 NOTE — Telephone Encounter (Signed)
Requested Prescriptions  Pending Prescriptions Disp Refills   glucose blood (ACCU-CHEK GUIDE) test strip 100 each 12    Sig: Use as instructed 3 times a day     Endocrinology: Diabetes - Testing Supplies Passed - 09/26/2022 11:07 AM      Passed - Valid encounter within last 12 months    Recent Outpatient Visits           5 days ago Type 2 diabetes mellitus with obesity (HCC)   [place] [facility] Jonah Blue B, MD   4 months ago Controlled type 2 diabetes mellitus with proliferative retinopathy and macular edema, without long-term current use of insulin, unspecified laterality [facility])   [place] [facility] Jonah Blue B, MD   8 months ago Controlled type 2 diabetes mellitus with complication, without long-term current use of insulin [facility])   [place] [facility], [place], [place]   1 year ago DM type 2 with diabetic peripheral neuropathy [facility])   [place] [facility] Jonah Blue B, MD   1 year ago Type 2 diabetes mellitus with obesity [facility])   [place] [facility] Marcine Matar, MD       Future Appointments             In 1 month Lois Huxley, Cornelius Moras, RPH-CPP [facility]   In 1 month Jodelle Red, MD [facility] [place], [place]   In 3 months Laural Benes, Binnie Rail, MD [facility]

## 2022-10-26 ENCOUNTER — Ambulatory Visit: Payer: Medicaid Other | Attending: Internal Medicine | Admitting: Pharmacist

## 2022-10-26 ENCOUNTER — Encounter: Payer: Self-pay | Admitting: Pharmacist

## 2022-10-26 VITALS — BP 128/83 | HR 73

## 2022-10-26 DIAGNOSIS — I152 Hypertension secondary to endocrine disorders: Secondary | ICD-10-CM | POA: Diagnosis not present

## 2022-10-26 DIAGNOSIS — E1159 Type 2 diabetes mellitus with other circulatory complications: Secondary | ICD-10-CM | POA: Diagnosis not present

## 2022-10-26 NOTE — Progress Notes (Signed)
   S:     No chief complaint on file.  61 y.o. female who presents for hypertension evaluation, education, and management.  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  Patient was referred and last seen by Primary Care Provider, Dr. Laural Benes, on 09/21/2022. BP was 137/82 mmHg at that visit.   Today, patient arrives in good spirits and presents without assistance. Denies dizziness, headache, blurred vision, swelling.   Patient reports hypertension is longstanding.   Family/Social history:  -Fhx: DM, HTN -Tobacco: never smoker  -Alcohol: none reported   Medication adherence reported. Patient has taken BP medications today.  Current antihypertensives include: carvedilol 12.5 mg BID, lisinopril 30 mg daily **Takes Lasix 20 mg daily  Antihypertensives tried in the past include: amlodipine (LE edema), hydrochlorothiazide (changed pt to Lasix d/t edema)  Reported home BP readings: reports that it's high but I do not have any numbers   Patient reported dietary habits:  -Not fully adherent to sodium restriction -Denies drinking excessive amounts of caffeine   Patient-reported exercise habits: none  O:  Vitals:   10/26/22 1304  BP: 128/83  Pulse: 73    Last 3 Office BP readings: BP Readings from Last 3 Encounters:  10/26/22 128/83  09/21/22 137/82  07/23/22 (!) 164/89    BMET    Component Value Date/Time   NA 138 07/16/2022 2039   NA 141 08/22/2021 1655   K 4.1 07/16/2022 2039   CL 102 07/16/2022 2039   CO2 27 07/16/2022 2039   GLUCOSE 149 (H) 07/16/2022 2039   BUN 15 07/16/2022 2039   BUN 15 08/22/2021 1655   CREATININE 0.92 07/16/2022 2039   CREATININE 0.93 02/13/2016 1746   CALCIUM 8.8 (L) 07/16/2022 2039   GFRNONAA >60 07/16/2022 2039   GFRAA 87 04/14/2020 1402    Renal function: CrCl cannot be calculated (Patient's most recent lab result is older  than the maximum 21 days allowed.).  Clinical ASCVD: none The 10-year ASCVD risk score (Arnett DK, et al., 2019) is: 20.1%   Values used to calculate the score:     Age: 45 years     Sex: Female     Is Non-Hispanic African American: Yes     Diabetic: Yes     Tobacco smoker: No     Systolic Blood Pressure: 128 mmHg     Is BP treated: Yes     HDL Cholesterol: 42 mg/dL     Total Cholesterol: 199 mg/dL  Patient is participating in a Managed Medicaid Plan:  Yes    A/P: Hypertension diagnosed currently at goal on current medications. BP goal < 130/80 mmHg. Medication adherence appears appropriate. -Continued current medications. -Counseled on lifestyle modifications for blood pressure control including reduced dietary sodium, increased exercise, adequate sleep. -Encouraged patient to check BP at home and bring log of readings to next visit. Counseled on proper use of home BP cuff.   Results reviewed and written information provided.    Written patient instructions provided. Patient verbalized understanding of treatment plan.  Total time in face to face counseling 15 minutes.    Follow-up:  Pharmacist prn. PCP clinic visit in 01/22/2023.   Butch Penny, PharmD, Patsy [place], CPP Clinical Pharmacist [facility] [phone]

## 2022-11-01 ENCOUNTER — Ambulatory Visit (INDEPENDENT_AMBULATORY_CARE_PROVIDER_SITE_OTHER): Payer: Medicaid Other | Admitting: Cardiology

## 2022-11-01 ENCOUNTER — Encounter (HOSPITAL_BASED_OUTPATIENT_CLINIC_OR_DEPARTMENT_OTHER): Payer: Self-pay | Admitting: Cardiology

## 2022-11-01 VITALS — BP 122/60 | HR 72 | Ht 68.0 in | Wt 199.0 lb

## 2022-11-01 DIAGNOSIS — I10 Essential (primary) hypertension: Secondary | ICD-10-CM

## 2022-11-01 DIAGNOSIS — E782 Mixed hyperlipidemia: Secondary | ICD-10-CM

## 2022-11-01 DIAGNOSIS — E118 Type 2 diabetes mellitus with unspecified complications: Secondary | ICD-10-CM | POA: Diagnosis not present

## 2022-11-01 DIAGNOSIS — E1159 Type 2 diabetes mellitus with other circulatory complications: Secondary | ICD-10-CM

## 2022-11-01 DIAGNOSIS — Z7984 Long term (current) use of oral hypoglycemic drugs: Secondary | ICD-10-CM | POA: Diagnosis not present

## 2022-11-01 DIAGNOSIS — I152 Hypertension secondary to endocrine disorders: Secondary | ICD-10-CM

## 2022-11-01 DIAGNOSIS — I2510 Atherosclerotic heart disease of native coronary artery without angina pectoris: Secondary | ICD-10-CM | POA: Diagnosis not present

## 2022-11-01 MED ORDER — CARVEDILOL 6.25 MG PO TABS: 6.2500 mg | ORAL_TABLET | Freq: Two times a day (BID) | ORAL | 3 refills | Status: DC

## 2022-11-01 NOTE — Progress Notes (Signed)
Cardiology Office Note:  .    Date:  11/01/2022  ID:  Lisa Olson, DOB [birth year], MRN [id number] PCP: Marcine Matar, MD  [facility] Providers Cardiologist:  Jodelle Red, MD     History of Present Illness: .    Lisa Olson is a 62 y.o. female with a hx of Presumed CAD based on abnormal stress test (medical management), hypertension, type II diabetes with retinopathy and neuropathy, hyperlipidemia, who is seen for follow up. I initially saw her 06/03/19 as a new consult at the request of Marcine Matar, MD for the evaluation and management of chest pain.   At her visit 05/2021, she was doing well without recurring chest pains. Exercised routinely and was wearing compression socks. Blood sugars had been excellent at home. No smoking or alcohol consumption. On 07/16/2022 she had a dental procedure and it was noted she had elevated blood pressure so she was evaluated in the ED. Blood pressure in the ED was 146/51. There was also concern for atypical chest pain. Troponin normal; chest x-ray negative. EKG showed NSR. Discharged in stable condition but she continued to have elevated readings as high as 225/112 at home with recurring chest pain. She went to urgent care 07/23/2022 and noted she had been taking lisinopril and carvedilol daily. She had not started the increased dose of carvedilol. On the way to urgent care she suffered a mechanical fall, landing on her right knee. Blood pressure in triage 164/89.  Today, we reviewed her ED visit earlier this year. At the time she had been under some stress with traveling. She also recalls her mechanical fall around the same time.  In the office today her blood pressure is well controlled at 122/60. Currently she is taking 2 tablets of 6.25 mg carvedilol twice daily (12.5 mg BID). She is staying active with walking at least 3 times a week if not more, and makes sure to go uphill around the block. No anginal symptoms.   She confirms her diabetes  has been stable. Glucose 116 this morning, last A1c 6.2%.  She is taking rosuvastatin 10 mg every other day, which she is tolerating. She notes that she is unable to tolerate taking her statin every day.  She denies any palpitations, chest pain, shortness of breath, peripheral edema, lightheadedness, headaches, syncope, orthopnea, or PND.  ROS:  Please see the history of present illness. ROS otherwise negative except as noted.   Studies Reviewed: Marland Kitchen       No new  Physical Exam:    VS:  BP 122/60   Pulse 72   Ht 5\' 8"  (1.727 m)   Wt 199 lb (90.3 kg)   SpO2 92%   BMI 30.26 kg/m    Wt Readings from Last 3 Encounters:  11/01/22 199 lb (90.3 kg)  09/21/22 210 lb (95.3 kg)  07/16/22 215 lb (97.5 kg)    GEN: Well nourished, well developed in no acute distress HEENT: Normal, moist mucous membranes NECK: No JVD CARDIAC: regular rhythm, normal S1 and S2, no rubs or gallops. No murmur. VASCULAR: Radial and DP pulses 2+ bilaterally. No carotid bruits RESPIRATORY:  Clear to auscultation without rales, wheezing or rhonchi  ABDOMEN: Soft, non-tender, non-distended MUSCULOSKELETAL:  Ambulates independently SKIN: Warm and dry, no edema NEUROLOGIC:  Alert and oriented x 3. No focal neuro deficits noted. PSYCHIATRIC:  Normal affect   ASSESSMENT AND PLAN: .    Nonspecific chest pain, dyspnea on exertion: now resolved -Lexiscan with moderate reversible ischemia,  consistent with CAD -discussed options, elected medical management and doing well with this. -tolerating imdur, no further chest discomfort -tolerating rosuvastatin -no aspirin given prior retinal hemorrhages   Mixed hyperlipidemia:  -goal LDL <70 given abnormal stress test, have been above goal -tolerates rosuvastatin every other day but no more frequently   Hypertension: goal <130/80. -home BP cuff matches office, checked previously -reports home numbers are well controlled.  -continue carvedilol 6.25 mg BID, lisinopril 30  mg daily, imdur 30 mg daily -on furosemide 20 mg daily   Type II diabetes: A1c 6.2 per KPN, on metformin and glimeperide. Not on insulin -complicated by neuropathy and retinopathy, legally blind -consider SGLT2i/GLP1RA given presumed CAD. Does not wish to change at this time, continue to address -losing weight with increasing activity, congratulated   Cardiac risk counseling and prevention recommendations: -recommend heart healthy/Mediterranean diet, with whole grains, fruits, vegetable, fish, lean meats, nuts, and olive oil. Limit salt. -limited exercise ability -recommend avoidance of tobacco products. Avoid excess alcohol.  Dispo: Follow-up in 1 year, or sooner as needed.  I,Mathew Stumpf,acting as a Neurosurgeon for [employer], MD.,have documented all relevant documentation on the behalf of Jodelle Red, MD,as directed by  Jodelle Red, MD while in the presence of Jodelle Red, MD.  I, Jodelle Red, MD, have reviewed all documentation for this visit. The documentation on 11/01/22 for the exam, diagnosis, procedures, and orders are all accurate and complete.   Signed, Jodelle Red, MD

## 2022-11-01 NOTE — Patient Instructions (Signed)
Medication Instructions:  Continue current medications  *If you need a refill on your cardiac medications before your next appointment, please call your pharmacy*   Lab Work: None Ordered   Testing/Procedures: None Ordered   Follow-Up: At [facility], you and your health needs are our priority.  As part of our continuing mission to provide you with exceptional heart care, we have created designated Provider Care Teams.  These Care Teams include your primary Cardiologist (physician) and Advanced Practice Providers (APPs -  Physician Assistants and Nurse Practitioners) who all work together to provide you with the care you need, when you need it.  We recommend signing up for the patient portal called "MyChart".  Sign up information is provided on this After Visit Summary.  MyChart is used to connect with patients for Virtual Visits (Telemedicine).  Patients are able to view lab/test results, encounter notes, upcoming appointments, etc.  Non-urgent messages can be sent to your provider as well.   To learn more about what you can do with MyChart, go to [email or web address].    Your next appointment:   1 year(s)  Provider:   Bridgette Christopher, MD    Other Instructions   

## 2022-12-26 ENCOUNTER — Ambulatory Visit: Payer: Medicaid Other | Admitting: Podiatry

## 2023-01-22 ENCOUNTER — Encounter: Payer: Self-pay | Admitting: Internal Medicine

## 2023-01-22 ENCOUNTER — Ambulatory Visit: Payer: Medicaid Other | Attending: Internal Medicine | Admitting: Internal Medicine

## 2023-01-22 VITALS — BP 135/79 | HR 83 | Ht 68.0 in | Wt 203.0 lb

## 2023-01-22 DIAGNOSIS — E1159 Type 2 diabetes mellitus with other circulatory complications: Secondary | ICD-10-CM | POA: Diagnosis not present

## 2023-01-22 DIAGNOSIS — Z7984 Long term (current) use of oral hypoglycemic drugs: Secondary | ICD-10-CM

## 2023-01-22 DIAGNOSIS — E1169 Type 2 diabetes mellitus with other specified complication: Secondary | ICD-10-CM | POA: Diagnosis not present

## 2023-01-22 DIAGNOSIS — Z2821 Immunization not carried out because of patient refusal: Secondary | ICD-10-CM

## 2023-01-22 DIAGNOSIS — E669 Obesity, unspecified: Secondary | ICD-10-CM | POA: Diagnosis not present

## 2023-01-22 DIAGNOSIS — I2510 Atherosclerotic heart disease of native coronary artery without angina pectoris: Secondary | ICD-10-CM | POA: Diagnosis not present

## 2023-01-22 DIAGNOSIS — E119 Type 2 diabetes mellitus without complications: Secondary | ICD-10-CM

## 2023-01-22 DIAGNOSIS — I152 Hypertension secondary to endocrine disorders: Secondary | ICD-10-CM | POA: Diagnosis not present

## 2023-01-22 DIAGNOSIS — D649 Anemia, unspecified: Secondary | ICD-10-CM | POA: Diagnosis not present

## 2023-01-22 LAB — POCT GLYCOSYLATED HEMOGLOBIN (HGB A1C): HbA1c, POC (controlled diabetic range): 6.8 % (ref 0.0–7.0)

## 2023-01-22 LAB — GLUCOSE, POCT (MANUAL RESULT ENTRY): POC Glucose: 167 mg/dL — AB (ref 70–99)

## 2023-01-22 NOTE — Progress Notes (Signed)
Patient ID: Lisa Olson, female    DOB: 28-Apr-1960  MRN: [id number]  CC: Diabetes (DM f/u. /No questions / concerns/No to flu vax.)   Subjective: Lisa Olson is a 62 y.o. female who presents for chronic ds management. 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  Checks BS twice a day.  Gives range 98-110 before BF.  Range before dinner 125-190.  A few lows; can feel when BS low. Doing better with eating habits, mindful of portions and has cut back on eating rice and potatoe salad.  Walks up and down a hill in her neighborhood 3x/wk.  Down 17 lbs in the past yr.   HTN/CAD/HL:   Currently on carvedilol 6.25 mg twice a day(dose dec from 12.5 mg BID back to 6.25 mg BID by Dr. Cristal Deer 10/2022), lisinopril 30 mg daily, furosemide 20 mg daily, Crestor 10 mg daily and Imdur 30 mg daily.  On last visit she indicated that she was not taking the Crestor every day because she felt it was affecting her memory.  LDL cholesterol was 119.  I recommended that she try to take it at least 4 to 5 days a week. Pt states she is still taking it 3x/wk -took morning BP meds already for today. -checks BP once a day in mornings.  Reports readings have been good.  Limits salt.  No CP/LE edema  Patient has a mild but stable anemia with hemoglobin around 10. Taking iron supplement once a day. 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, associated with type 2 diabetes mellitus (HCC) 05/22/2018   Nuclear sclerotic cataract of both eyes 02/11/2018   Retinal edema 02/11/2018   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 02/11/2018   Vitreous hemorrhage, right eye (HCC) 02/11/2018   Legal blindness 07/25/2017   Type 2 diabetes mellitus with retinopathy of both eyes, with long-term current use of insulin (HCC) 02/14/2016   Essential hypertension 02/14/2016   Disorder of skin or subcutaneous tissue 04/24/2013   Onychomycosis due to dermatophyte 03/25/2013   Hallux valgus, acquired 03/25/2013     Current Outpatient Medications on File Prior to Visit  Medication Sig Dispense Refill   Accu-Chek Softclix Lancets lancets Use as instructed 3 times a day  before meals 100 each 12   acetaminophen-codeine (TYLENOL #3) 300-30 MG tablet Take 1 tablet by mouth every 8 (eight) hours as needed for moderate pain. 15 tablet 0   Ascorbic Acid (VITAMIN C ER PO) Take by mouth.     Blood Glucose Monitoring Suppl (ACCU-CHEK GUIDE ME) w/Device KIT Use to check blood sugar once daily. E11.69 1 kit 0   Blood Glucose Monitoring Suppl (ACCU-CHEK GUIDE) w/Device KIT Use as directed 3 times a day 1 kit 0   carvedilol (COREG) 6.25 MG tablet Take 1 tablet (6.25 mg total) by mouth 2 (two) times daily with a meal. 180 tablet 3    ferrous sulfate 325 (65 FE) MG tablet Take 1 tablet (325 mg total) by mouth daily with breakfast. 100 tablet 3   furosemide (LASIX) 20 MG tablet Take 1 tablet (20 mg total) by mouth daily. Stop Hydrochlorothiazide 90 tablet 2   glimepiride (AMARYL) 4 MG tablet Take 1 tablet (4 mg total) by mouth 2 (two) times daily. 180 tablet 3   glucose blood (ACCU-CHEK GUIDE) test strip Use as instructed 3 times a day 100 each 12   ibuprofen (ADVIL) 800 MG tablet Take 1 tablet (800 mg total) by mouth 3 (three) times daily. 21 tablet 0   isosorbide mononitrate (IMDUR) 30 MG 24 hr tablet Take 1 tablet (30 mg total) by mouth daily. 90 tablet 3   lisinopril (ZESTRIL) 30 MG tablet Take 1 tablet (30 mg total) by mouth daily. 90 tablet 3   metFORMIN (GLUCOPHAGE) 1000 MG tablet Take 1 tablet (1,000 mg total) by mouth 2 (two) times daily with a meal. 180 tablet 3   Multiple Vitamins-Minerals (MULTIVITAMIN WITH MINERALS) tablet Take 1 tablet by mouth daily.     prednisoLONE acetate (PRED FORTE) 1 % ophthalmic suspension Place 1 drop into the right eye daily. 10 mL 11   rosuvastatin (CRESTOR) 10 MG tablet Take 1 tablet (10 mg total) by mouth daily. 90 tablet 3   No current facility-administered medications on file prior to visit.    No Known Allergies  Social History   Socioeconomic History   Marital status: Single    Spouse name: Not on file   Number of children: Not on file   Years of education: Not on file   Highest education level: Not on file  Occupational History   Not on file  Tobacco Use   Smoking status: Never   Smokeless tobacco: Never  Vaping Use   Vaping status: Never Used  Substance and Sexual Activity   Alcohol use: Not on file   Drug use: No   Sexual activity: Not on file  Other Topics Concern   Not on file  Social History Narrative   Not on file   Social Determinants of Health   Financial Resource Strain: Medium Risk (11/09/2020)   Overall Financial Resource Strain (CARDIA)     Difficulty of Paying Living Expenses: Somewhat hard  Food Insecurity: Food Insecurity Present (01/15/2022)   Hunger Vital Sign    Worried About Running Out of Food in the Last Year: Sometimes true    Ran Out of Food in the Last Year: Sometimes true  Transportation Needs: Unmet Transportation Needs (01/15/2022)   PRAPARE - Administrator, Civil Service (Medical): Yes    Lack of Transportation (Non-Medical): Yes  Physical Activity: Not on file  Stress: Not on file  Social Connections: Moderately Integrated (04/12/2021)   Social Connection and Isolation Panel [NHANES]  Frequency of Communication with Friends and Family: More than three times a week    Frequency of Social Gatherings with Friends and Family: Once a week    Attends Religious Services: More than 4 times per year    Active Member of Clubs or Organizations: Yes    Attends Engineer, structural: More than 4 times per year    Marital Status: Separated  Intimate Partner Violence: Not on file    Family History  Problem Relation Age of Onset   Diabetes Mother    Diabetes Father    Hypertension Sister     Past Surgical History:  Procedure Laterality Date   EYE SURGERY     LASIK     RETINAL DETACHMENT SURGERY      ROS: Review of Systems Negative except as stated above  PHYSICAL EXAM: BP 135/79 (BP Location: Left Arm, Patient Position: Sitting, Cuff Size: Normal)   Pulse 83   Ht 5\' 8"  (1.727 m)   Wt 203 lb (92.1 kg)   SpO2 98%   BMI 30.87 kg/m   Wt Readings from Last 3 Encounters:  01/22/23 203 lb (92.1 kg)  11/01/22 199 lb (90.3 kg)  09/21/22 210 lb (95.3 kg)  BP 138/80  Physical Exam  General appearance - alert, well appearing, and in no distress Mental status - normal mood, behavior, speech, dress, motor activity, and thought processes Neck - supple, no significant adenopathy Chest - clear to auscultation, no wheezes, rales or rhonchi, symmetric air entry Heart - normal rate, regular  rhythm, normal S1, S2, no murmurs, rubs, clicks or gallops Extremities - peripheral pulses normal, no pedal edema, no clubbing or cyanosis      Latest Ref Rng & Units 07/16/2022    8:39 PM 08/22/2021    4:55 PM 04/14/2020    2:02 PM  CMP  Glucose 70 - 99 mg/dL 161  096  045   BUN 8 - 23 mg/dL 15  15  14    Creatinine 0.44 - 1.00 mg/dL 4.09  8.11  9.14   Sodium 135 - 145 mmol/L 138  141  138   Potassium 3.5 - 5.1 mmol/L 4.1  4.1  4.3   Chloride 98 - 111 mmol/L 102  99  99   CO2 22 - 32 mmol/L 27  27  26    Calcium 8.9 - 10.3 mg/dL 8.8  9.5  9.2   Total Protein 6.5 - 8.1 g/dL 6.7  8.0  7.4   Total Bilirubin 0.3 - 1.2 mg/dL 0.5  0.3  0.2   Alkaline Phos 38 - 126 U/L 48  66  77   AST 15 - 41 U/L 19  15  15    ALT 0 - 44 U/L 19  17  20     Lipid Panel     Component Value Date/Time   CHOL 199 09/21/2022 0958   TRIG 217 (H) 09/21/2022 0958   HDL 42 09/21/2022 0958   CHOLHDL 4.7 (H) 09/21/2022 0958   CHOLHDL 4.7 05/14/2016 1607   VLDL 44 (H) 05/14/2016 1607   LDLCALC 119 (H) 09/21/2022 0958    CBC    Component Value Date/Time   WBC 6.3 09/21/2022 0958   WBC 5.8 07/16/2022 2039   RBC 4.11 09/21/2022 0958   RBC 3.87 07/16/2022 2039   HGB 10.4 (L) 09/21/2022 0958   HCT 34.6 09/21/2022 0958   PLT 279 09/21/2022 0958   MCV 84 09/21/2022 0958   MCH 25.3 (L) 09/21/2022 7829  MCH 25.8 (L) 07/16/2022 2039   MCHC 30.1 (L) 09/21/2022 0958   MCHC 31.1 07/16/2022 2039   RDW 13.0 09/21/2022 0958   LYMPHSABS 2.3 07/16/2022 2039   MONOABS 0.6 07/16/2022 2039   EOSABS 0.1 07/16/2022 2039   BASOSABS 0.0 07/16/2022 2039    ASSESSMENT AND PLAN: 1. Type 2 diabetes mellitus with obesity (HCC) At goal. Continue Metformin 1gram BID and Amaryl 4 mg BID.  She has occasional hypoglycemia.  Went over how to treat. Commended her on weight loss.  Continue healthy eating and regular exercise - POCT glycosylated hemoglobin (Hb A1C) - POCT glucose (manual entry)  2. Diabetes mellitus treated with  oral medication (HCC) See #1 above  3. Hypertension associated with type 2 diabetes mellitus (HCC) Close to goal. Continue carvedilol 6.25 mg twice a day, isosorbide 30 mg daily, lisinopril 30 mg daily  4. Coronary artery disease involving native coronary artery of native heart without angina pectoris Stable. Encouraged her to start taking the rosuvastatin 4 to 5 days a week.  She states that she will do so.  Continue carvedilol.  5. Chronic anemia Stable.  6. Influenza vaccination declined   This documentation was completed using Paediatric nurse.  Any transcriptional errors are unintentional.  Orders Placed This Encounter  Procedures   POCT glycosylated hemoglobin (Hb A1C)   POCT glucose (manual entry)     Requested Prescriptions    No prescriptions requested or ordered in this encounter    Return in about 4 months (around 05/22/2023).  Jonah Blue, MD, FACP

## 2023-02-28 ENCOUNTER — Other Ambulatory Visit: Payer: Self-pay | Admitting: Family Medicine

## 2023-02-28 DIAGNOSIS — E118 Type 2 diabetes mellitus with unspecified complications: Secondary | ICD-10-CM

## 2023-02-28 NOTE — Telephone Encounter (Signed)
Requested Prescriptions  Pending Prescriptions Disp Refills   glimepiride (AMARYL) 4 MG tablet [Pharmacy Med Name: Glimepiride 4 MG Oral Tablet] 180 tablet 1    Sig: Take 1 tablet by mouth twice daily     Endocrinology:  Diabetes - Sulfonylureas Passed - 02/28/2023  5:43 PM      Passed - HBA1C is between 0 and 7.9 and within 180 days    HbA1c, POC (controlled diabetic range)  Date Value Ref Range Status  01/22/2023 6.8 0.0 - 7.0 % Final         Passed - Cr in normal range and within 360 days    Creat  Date Value Ref Range Status  02/13/2016 0.93 0.50 - 1.05 mg/dL Final    Comment:      For patients > or = 63 years of age: The upper reference limit for Creatinine is approximately 13% higher for people identified as African-American.      Creatinine, Ser  Date Value Ref Range Status  07/16/2022 0.92 0.44 - 1.00 mg/dL Final   Creatinine, Urine  Date Value Ref Range Status  05/14/2016 227 20 - 320 mg/dL Final         Passed - Valid encounter within last 6 months    Recent Outpatient Visits           1 month ago Type 2 diabetes mellitus with obesity (HCC)   [facility] Of [place]. [facility] Jonah Blue B, MD   4 months ago Hypertension associated with type 2 diabetes mellitus [facility])   [place] [facility] Of [place]. [facility] Lois Huxley, [place] L, RPH-CPP   5 months ago Type 2 diabetes mellitus with obesity [facility])   [place] [facility] - [facility] Dept Of [place]. [facility] Jonah Blue B, MD   9 months ago Controlled type 2 diabetes mellitus with proliferative retinopathy and macular edema, without long-term current use of insulin, unspecified laterality [facility])   [place] [facility] Dept Of [place]. [facility] Jonah Blue B, MD   1 year ago Controlled type 2 diabetes mellitus with complication, without long-term current use of insulin (HCC)    [place] [facility] - A Dept Of [place]. [facility] [place], Marzella Schlein, [place]       Future Appointments             In 2 months Laural Benes, Binnie Rail, MD [facility] - A Dept Of [place]igha Bridegroom. [facility]

## 2023-05-28 ENCOUNTER — Encounter: Payer: Self-pay | Admitting: Internal Medicine

## 2023-05-28 ENCOUNTER — Ambulatory Visit: Payer: Medicaid Other | Attending: Internal Medicine | Admitting: Internal Medicine

## 2023-05-28 VITALS — BP 145/84 | HR 79 | Temp 98.1°F | Ht 68.0 in | Wt 212.0 lb

## 2023-05-28 DIAGNOSIS — R600 Localized edema: Secondary | ICD-10-CM

## 2023-05-28 DIAGNOSIS — R200 Anesthesia of skin: Secondary | ICD-10-CM | POA: Diagnosis not present

## 2023-05-28 DIAGNOSIS — R202 Paresthesia of skin: Secondary | ICD-10-CM | POA: Diagnosis not present

## 2023-05-28 DIAGNOSIS — Z6837 Body mass index (BMI) 37.0-37.9, adult: Secondary | ICD-10-CM

## 2023-05-28 DIAGNOSIS — E669 Obesity, unspecified: Secondary | ICD-10-CM | POA: Diagnosis not present

## 2023-05-28 DIAGNOSIS — I152 Hypertension secondary to endocrine disorders: Secondary | ICD-10-CM

## 2023-05-28 DIAGNOSIS — Z7984 Long term (current) use of oral hypoglycemic drugs: Secondary | ICD-10-CM | POA: Diagnosis not present

## 2023-05-28 DIAGNOSIS — I10 Essential (primary) hypertension: Secondary | ICD-10-CM | POA: Diagnosis not present

## 2023-05-28 DIAGNOSIS — E119 Type 2 diabetes mellitus without complications: Secondary | ICD-10-CM | POA: Diagnosis not present

## 2023-05-28 DIAGNOSIS — Z1211 Encounter for screening for malignant neoplasm of colon: Secondary | ICD-10-CM

## 2023-05-28 DIAGNOSIS — I2510 Atherosclerotic heart disease of native coronary artery without angina pectoris: Secondary | ICD-10-CM | POA: Diagnosis not present

## 2023-05-28 DIAGNOSIS — E1159 Type 2 diabetes mellitus with other circulatory complications: Secondary | ICD-10-CM

## 2023-05-28 LAB — POCT GLYCOSYLATED HEMOGLOBIN (HGB A1C): HbA1c, POC (controlled diabetic range): 7.8 % — AB (ref 0.0–7.0)

## 2023-05-28 LAB — GLUCOSE, POCT (MANUAL RESULT ENTRY): POC Glucose: 222 mg/dL — AB (ref 70–99)

## 2023-05-28 MED ORDER — ISOSORBIDE MONONITRATE ER 30 MG PO TB24
30.0000 mg | ORAL_TABLET | Freq: Every day | ORAL | 3 refills | Status: AC
Start: 2023-05-28 — End: ?

## 2023-05-28 MED ORDER — FUROSEMIDE 20 MG PO TABS
20.0000 mg | ORAL_TABLET | Freq: Every day | ORAL | 2 refills | Status: AC
Start: 2023-05-28 — End: ?

## 2023-05-28 NOTE — Progress Notes (Unsigned)
 Patient ID: Lisa Olson, female    DOB: Nov 30, 1960  MRN: [id number]  CC: Diabetes (DM & HTN f/u./Numbness on L side of face & burning sensation on lips/mouth X2 weeks /No to pneumonia vax)   Subjective: Lisa Olson is a 63 y.o. female who presents for chronic ds management. 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  History of Present Illness   DM: Results for orders placed or performed in visit on 05/28/23  POCT glucose (manual entry)   Collection Time: 05/28/23  3:55 PM  Result Value Ref Range   POC Glucose 222 (A) 70 - 99 mg/dl  POCT glycosylated hemoglobin (Hb A1C)   Collection Time: 05/28/23  4:10 PM  Result Value Ref Range   Hemoglobin A1C     HbA1c POC (<> result, manual entry)     HbA1c, POC (prediabetic range)     HbA1c, POC (controlled diabetic range) 7.8 (A) 0.0 - 7.0 %   A1c has increased from 6.8 to 7.8 over the past four months. She is currently on metformin 1000mg  twice a day and glimepiride 4mg  twice a day for her diabetes. She occasionally skips her morning dose of metformin when her fasting blood sugar is low.  She has not taken the metformin or the glimepiride this morning because blood sugar was 70 before breakfast.  -Drinks mainly water, unsweetened iced tea and Snapple. -Had recent episode of hyperglycemia with blood sugars 508 during recent travel back from [place] on 05/22/2023.  Reports that she splurge eating out of buffet before getting on the train.  Shortly after developed vomiting and blood sugar was 508.  She was taken to the emergency room via EMS.  Given 5 units of insulin and IV fluids and was discharged.  I am able to see blood test in the EMR.  Anion gap was 16.   -Attributes increased A1c  to dietary indiscretions and not exercising as before due to being out of town. -Weight is up 9 pounds since November.  Reports episode of left-sided facial numbness that occurred about 3 weeks ago and lasted for 45 minutes.  No worsening of vision.  No numbness or weakness in the extremities.  After resolved, she developed a burning, numbing sensation in both lips that has persisted.    She also reports a recent fall that occurred the day after visit to the ER due to a misplaced slipper, which resulted in a scrape on her left knee and leg. She notes a slight swelling in her left leg following the fall.  HTN: Her current medications for hypertension include carvedilol 6.25mg  twice a day, lisinopril 30mg  daily, furosemide 20mg  daily, and isosorbide 30mg  daily.  Reports compliance with taking them took already for today.  She also takes rosuvastatin for cholesterol management three to four times a week.       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, associated with type 2 diabetes mellitus (HCC) 05/22/2018   Nuclear sclerotic cataract of both eyes 02/11/2018   Retinal edema 02/11/2018   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 02/11/2018   Vitreous hemorrhage, right eye (HCC) 02/11/2018   Legal blindness 07/25/2017   Type 2 diabetes mellitus with retinopathy of  both eyes, with long-term current use of insulin (HCC) 02/14/2016   Essential hypertension 02/14/2016   Disorder of skin or subcutaneous tissue 04/24/2013   Onychomycosis due to dermatophyte 03/25/2013   Hallux valgus, acquired 03/25/2013     Current Outpatient Medications on File Prior to Visit  Medication Sig Dispense Refill   Accu-Chek Softclix Lancets lancets Use as instructed 3 times a day  before meals 100 each 12   acetaminophen-codeine (TYLENOL #3) 300-30 MG tablet Take 1 tablet by mouth every 8 (eight) hours as needed for moderate pain. 15 tablet 0   Ascorbic Acid (VITAMIN C ER PO) Take by mouth.     Blood Glucose Monitoring Suppl (ACCU-CHEK GUIDE ME) w/Device KIT Use to check blood sugar once daily. E11.69 1 kit 0   Blood Glucose Monitoring Suppl (ACCU-CHEK GUIDE) w/Device KIT Use as directed 3 times a day 1 kit 0   carvedilol (COREG) 6.25 MG tablet Take 1 tablet (6.25 mg total) by mouth 2 (two) times daily with a meal. 180 tablet 3   ferrous sulfate 325 (65 FE) MG tablet Take 1 tablet (325 mg total) by mouth daily with breakfast. 100 tablet 3   glimepiride (AMARYL) 4 MG tablet Take 1 tablet by mouth twice daily 180 tablet 1   glucose blood (ACCU-CHEK GUIDE) test strip Use as instructed 3 times a day 100 each 12   ibuprofen (ADVIL) 800 MG tablet Take 1 tablet (800 mg total) by mouth 3 (three) times daily. 21 tablet 0   lisinopril (ZESTRIL) 30 MG tablet Take 1 tablet (30 mg total) by mouth daily. 90 tablet 3   metFORMIN (GLUCOPHAGE) 1000 MG tablet Take 1 tablet (1,000 mg total) by mouth 2 (two) times daily with a meal. 180 tablet 3   Multiple Vitamins-Minerals (MULTIVITAMIN WITH MINERALS) tablet Take 1 tablet by mouth daily.     prednisoLONE acetate (PRED FORTE) 1 % ophthalmic suspension Place 1 drop into the right eye daily. 10 mL 11   rosuvastatin (CRESTOR) 10 MG tablet Take 1 tablet (10 mg total) by mouth daily. 90 tablet 3   No current facility-administered medications on file  prior to visit.    No Known Allergies  Social History   Socioeconomic History   Marital status: Single    Spouse name: Not on file   Number of children: Not on file   Years of education: Not on file   Highest education level: Not on file  Occupational History   Not on file  Tobacco Use   Smoking status: Never   Smokeless tobacco: Never  Vaping Use   Vaping status: Never Used  Substance and Sexual Activity   Alcohol use: Not on file   Drug use: No   Sexual activity: Not on file  Other Topics Concern   Not on file  Social History Narrative   Not on file   Social Drivers of Health   Financial Resource Strain: Medium Risk (11/09/2020)   Overall  Financial Resource Strain (CARDIA)    Difficulty of Paying Living Expenses: Somewhat hard  Food Insecurity: Food Insecurity Present (01/15/2022)   Hunger Vital Sign    Worried About Running Out of Food in the Last Year: Sometimes true    Ran Out of Food in the Last Year: Sometimes true  Transportation Needs: Unmet Transportation Needs (01/15/2022)   PRAPARE - Administrator, Civil Service (Medical): Yes    Lack of Transportation (Non-Medical): Yes  Physical Activity: Not on file  Stress: Not on file  Social Connections: Moderately Integrated (04/12/2021)   Social Connection and Isolation Panel [NHANES]    Frequency of Communication with Friends and Family: More than three times a week    Frequency of Social Gatherings with Friends and Family: Once a week    Attends Religious Services: More than 4 times per year    Active Member of Clubs or Organizations: Yes    Attends Engineer, structural: More than 4 times per year    Marital Status: Separated  Intimate Partner Violence: Not on file    Family History  Problem Relation Age of Onset   Diabetes Mother    Diabetes Father    Hypertension Sister     Past Surgical History:  Procedure Laterality Date   EYE SURGERY     LASIK     RETINAL DETACHMENT  SURGERY      ROS: Review of Systems Negative except as stated above  PHYSICAL EXAM: BP (!) 145/84   Pulse 79   Temp 98.1 F (36.7 C) (Oral)   Ht 5\' 8"  (1.727 m)   Wt 212 lb (96.2 kg)   SpO2 97%   BMI 32.23 kg/m   Wt Readings from Last 3 Encounters:  05/28/23 212 lb (96.2 kg)  01/22/23 203 lb (92.1 kg)  11/01/22 199 lb (90.3 kg)    Physical Exam  General appearance - {:315021} Mental status - {:313008} Neck - {:315327} Chest - {:315033} Heart - {:315510} Neurological - {:315902} Extremities - {:315109}      Latest Ref Rng & Units 07/16/2022    8:39 PM 08/22/2021    4:55 PM 04/14/2020    2:02 PM  CMP  Glucose 70 - 99 mg/dL 409  811  914   BUN 8 - 23 mg/dL 15  15  14    Creatinine 0.44 - 1.00 mg/dL 7.82  9.56  2.13   Sodium 135 - 145 mmol/L 138  141  138   Potassium 3.5 - 5.1 mmol/L 4.1  4.1  4.3   Chloride 98 - 111 mmol/L 102  99  99   CO2 22 - 32 mmol/L 27  27  26    Calcium 8.9 - 10.3 mg/dL 8.8  9.5  9.2   Total Protein 6.5 - 8.1 g/dL 6.7  8.0  7.4   Total Bilirubin 0.3 - 1.2 mg/dL 0.5  0.3  0.2   Alkaline Phos 38 - 126 U/L 48  66  77   AST 15 - 41 U/L 19  15  15    ALT 0 - 44 U/L 19  17  20     Lipid Panel     Component Value Date/Time   CHOL 199 09/21/2022 0958   TRIG 217 (H) 09/21/2022 0958   HDL 42 09/21/2022 0958   CHOLHDL 4.7 (H) 09/21/2022 0958   CHOLHDL 4.7 05/14/2016 1607   VLDL 44 (H) 05/14/2016 1607   LDLCALC 119 (H) 09/21/2022 0958    CBC  Component Value Date/Time   WBC 6.3 09/21/2022 0958   WBC 5.8 07/16/2022 2039   RBC 4.11 09/21/2022 0958   RBC 3.87 07/16/2022 2039   HGB 10.4 (L) 09/21/2022 0958   HCT 34.6 09/21/2022 0958   PLT 279 09/21/2022 0958   MCV 84 09/21/2022 0958   MCH 25.3 (L) 09/21/2022 0958   MCH 25.8 (L) 07/16/2022 2039   MCHC 30.1 (L) 09/21/2022 0958   MCHC 31.1 07/16/2022 2039   RDW 13.0 09/21/2022 0958   LYMPHSABS 2.3 07/16/2022 2039   MONOABS 0.6 07/16/2022 2039   EOSABS 0.1 07/16/2022 2039   BASOSABS 0.0  07/16/2022 2039    ASSESSMENT AND PLAN: 1. Type 2 diabetes mellitus with obesity (HCC) (Primary) *** - POCT glycosylated hemoglobin (Hb A1C) - POCT glucose (manual entry)  2. Diabetes mellitus treated with oral medication (HCC) ***  3. Hypertension associated with type 2 diabetes mellitus (HCC) *** - furosemide (LASIX) 20 MG tablet; Take 1 tablet (20 mg total) by mouth daily. Stop Hydrochlorothiazide  Dispense: 90 tablet; Refill: 2 - isosorbide mononitrate (IMDUR) 30 MG 24 hr tablet; Take 1 tablet (30 mg total) by mouth daily.  Dispense: 90 tablet; Refill: 3  4. Coronary artery disease involving native coronary artery of native heart without angina pectoris ***  5. Numbness and tingling of left side of face *** - Cologuard - CT HEAD WO CONTRAST ( ); Future - Ambulatory referral to Neurology  6. Numbness of lip ***  7. Edema of left lower leg *** - VAS [place] LOWER EXTREMITY VENOUS (DVT); Future  8. Screening for colon cancer ***  Assessment and Plan    Type 2 Diabetes Mellitus A1c increased to 7.8%. Hyperglycemic episode required hospitalization and insulin. Skipping medications and dietary indiscretions noted. - Continue metformin 1000 mg twice daily. - Continue glimepiride 4 mg twice daily. - Advise taking medications after eating if blood sugar is low. - Encourage dietary modifications to reduce sugar intake. - Monitor blood sugar levels 2-3 times daily.  Hypertension Blood pressure elevated at 145/84 mmHg. Home readings 130-145/85-90 mmHg. On carvedilol, lisinopril, furosemide, and isosorbide. Hesitant to increase carvedilol dosage. Concern for stroke risk with recent facial numbness. - Recheck blood pressure during the visit. - Encourage adherence to current antihypertensive regimen. - Discuss potential increase in carvedilol dosage if lifestyle modifications do not improve blood pressure. - Encourage lifestyle modifications including diet and exercise.  Facial  Numbness and Burning Sensation Transient left facial numbness and persistent lip burning. Differential includes TIA or neuropathy. - Order CT scan of the head to rule out stroke or TIA. - Refer to neurology for further evaluation.  Left Leg Swelling Reports fall and subsequent swelling. Concern for DVT due to recent travel and fall. - Order ultrasound of the left leg to rule out DVT.  General Health Maintenance Due for colon cancer screening. - Order Cologuard test for colon cancer screening.         1. Type 2 diabetes mellitus with obesity (HCC) (Primary) *** - POCT glycosylated hemoglobin (Hb A1C) - POCT glucose (manual entry)    Patient was given the opportunity to ask questions.  Patient verbalized understanding of the plan and was able to repeat key elements of the plan.   This documentation was completed using Paediatric nurse.  Any transcriptional errors are unintentional.  Orders Placed This Encounter  Procedures   CT HEAD WO CONTRAST ( )   Cologuard   Ambulatory referral to Neurology   POCT glycosylated hemoglobin (Hb  A1C)   POCT glucose (manual entry)   VAS [place] LOWER EXTREMITY VENOUS (DVT)     Requested Prescriptions   Signed Prescriptions Disp Refills   furosemide (LASIX) 20 MG tablet 90 tablet 2    Sig: Take 1 tablet (20 mg total) by mouth daily. Stop Hydrochlorothiazide   isosorbide mononitrate (IMDUR) 30 MG 24 hr tablet 90 tablet 3    Sig: Take 1 tablet (30 mg total) by mouth daily.    Return in about 4 months (around 09/27/2023).  Jonah Blue, MD, FACP

## 2023-05-29 ENCOUNTER — Encounter: Payer: Self-pay | Admitting: Neurology

## 2023-05-29 ENCOUNTER — Encounter: Payer: Self-pay | Admitting: Internal Medicine

## 2023-05-30 ENCOUNTER — Telehealth: Payer: Self-pay

## 2023-05-30 NOTE — Telephone Encounter (Signed)
 No PA needed for  VAS [place] LOWER EXTREMITY VENOUS (DVT) (Order [id number]. Spoke with scheduling in vascular at [facility].Patient will be called to be scheduled ASAP. Patient is also aware and voiced if she does not receive a call today she will call to schedule.

## 2023-06-03 ENCOUNTER — Ambulatory Visit
Admission: RE | Admit: 2023-06-03 | Discharge: 2023-06-03 | Disposition: A | Discharge status: Home / Self Care | Source: Ambulatory Visit | Attending: Internal Medicine | Admitting: Internal Medicine

## 2023-06-03 DIAGNOSIS — R200 Anesthesia of skin: Secondary | ICD-10-CM

## 2023-06-03 DIAGNOSIS — G459 Transient cerebral ischemic attack, unspecified: Secondary | ICD-10-CM | POA: Diagnosis not present

## 2023-06-04 ENCOUNTER — Ambulatory Visit (HOSPITAL_COMMUNITY)
Admission: RE | Admit: 2023-06-04 | Discharge: 2023-06-04 | Disposition: A | Discharge status: Home / Self Care | Source: Ambulatory Visit | Attending: Internal Medicine | Admitting: Internal Medicine

## 2023-06-04 DIAGNOSIS — R600 Localized edema: Secondary | ICD-10-CM | POA: Diagnosis not present

## 2023-06-07 LAB — COLOGUARD

## 2023-06-10 ENCOUNTER — Telehealth: Payer: Self-pay

## 2023-06-10 NOTE — Telephone Encounter (Signed)
 Call to patient unable to reach VM left

## 2023-06-11 ENCOUNTER — Telehealth: Payer: Self-pay | Admitting: *Deleted

## 2023-06-11 NOTE — Telephone Encounter (Signed)
 Copied from CRM [id number]. Topic: General - Other >> Jun 10, 2023  5:23 PM Lisa Olson wrote: Reason for CRM: Patient returned call to the office. Attempted to transfer the call to [facility] but there was no answer. Patient requests call back

## 2023-06-11 NOTE — Telephone Encounter (Signed)
 Patient advised Per PCP Doppler ultrasound of the left leg was negative for blood clot. Patient voiced understanding. Had not questions.

## 2023-06-11 NOTE — Telephone Encounter (Signed)
 Call placed to patient unable to reach message left on VM.

## 2023-06-27 DIAGNOSIS — Z1211 Encounter for screening for malignant neoplasm of colon: Secondary | ICD-10-CM | POA: Diagnosis not present

## 2023-07-01 ENCOUNTER — Ambulatory Visit: Admitting: Neurology

## 2023-07-01 ENCOUNTER — Other Ambulatory Visit

## 2023-07-01 ENCOUNTER — Encounter: Payer: Self-pay | Admitting: Neurology

## 2023-07-01 VITALS — BP 144/62 | HR 68 | Ht 68.0 in | Wt 210.0 lb

## 2023-07-01 DIAGNOSIS — K146 Glossodynia: Secondary | ICD-10-CM

## 2023-07-01 DIAGNOSIS — R200 Anesthesia of skin: Secondary | ICD-10-CM

## 2023-07-01 NOTE — Progress Notes (Signed)
 [facility] Clinic Note - Initial Visit   Date: 07/01/2023   Lisa Olson MRN: [id number] DOB: 30-May-1960   Dear Dr. Laural Benes:  Thank you for your kind referral of Lisa Olson for consultation of left facial numbness. Although her history is well known to you, please allow [place] to reiterate it for the purpose of our medical record. The patient was accompanied to the clinic by self.    Lisa Olson is a 63 y.o. right-handed female with diabetes mellitus complicated by neuropathy and retinopathy (legally blind in both eyes, worse on the right), and CAD presenting for evaluation of left facial numbness.   IMPRESSION/PLAN: Left facial numbness, transient (Jan 2025), lasting a few minutes.  Resolved.  CT head did not show structural pathology.  Possible TIA.   - Discussed CTA head and neck to evaluate vessel status, patient would prefer to hold on imaging and monitor  - She is unable to take aspirin  2.   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  There was no associated weakness, difficulty speech/swallowing, numbness/tingling in the arms/leg.  She is legally blind due to diabetic retinopathy.  Symptoms have not recurred.  She also had burning of the entire tongue, which is constant.  She does not have problems with taste. She is unable to take aspirin due to retinal bleeding.   She lives alone.  She is from [place].  Out-side paper records, electronic medical record, and images have been reviewed where available and summarized as:  CT head wo contrast 06/19/2023: No acute intracranial abnormality, specifically, no acute hemorrhage, territorial infarction, or  intracranial mass.  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    Ascorbic Acid (VITAMIN C ER PO) Take by mouth.    Blood Glucose Monitoring Suppl (ACCU-CHEK GUIDE ME) w/Device KIT Use to check blood sugar once daily. E11.69    Blood Glucose Monitoring Suppl (ACCU-CHEK GUIDE) w/Device KIT Use as directed 3 times a day    carvedilol (COREG) 6.25 MG tablet Take 1 tablet (6.25 mg total) by mouth 2 (two) times daily with a meal.    ferrous sulfate 325 (65 FE) MG tablet Take 1 tablet (325 mg total) by mouth daily with breakfast.    furosemide (LASIX) 20 MG tablet Take 1 tablet (20 mg total) by mouth daily. Stop Hydrochlorothiazide    glimepiride (  AMARYL) 4 MG tablet Take 1 tablet by mouth twice daily    glucose blood (ACCU-CHEK GUIDE) test strip Use as instructed 3 times a day    ibuprofen (ADVIL) 800 MG tablet Take 1 tablet (800 mg total) by mouth 3 (three) times daily.    isosorbide mononitrate (IMDUR) 30 MG 24 hr tablet Take 1 tablet (30 mg total) by mouth daily.    lisinopril (ZESTRIL) 30 MG tablet Take 1 tablet (30 mg total) by mouth daily.    metFORMIN (GLUCOPHAGE) 1000 MG tablet Take 1 tablet (1,000 mg total) by mouth 2 (two) times daily  with a meal.    Multiple Vitamins-Minerals (MULTIVITAMIN WITH MINERALS) tablet Take 1 tablet by mouth daily.    rosuvastatin (CRESTOR) 10 MG tablet Take 1 tablet (10 mg total) by mouth daily. 06/19/2021: Taking everyother day   [DISCONTINUED] prednisoLONE acetate (PRED FORTE) 1 % ophthalmic suspension Place 1 drop into the right eye daily. (Patient not taking: Reported on 07/01/2023)    No facility-administered encounter medications on file as of 07/01/2023.    Allergies: No Known Allergies  Family History: Family History  Problem Relation Age of Onset   Diabetes Mother    Diabetes Father    Hypertension Sister     Social History: Social History   Tobacco Use   Smoking status: Never   Smokeless tobacco: Never  Vaping Use   Vaping status: Never Used  Substance Use Topics   Alcohol use: Not Currently   Drug use: No   Social History   Social History Narrative   Are you right handed or left handed? Right Handed    Are you currently employed ? No    What is your current occupation? No    Do you live at home alone? Yes    Who lives with you?    What type of home do you live in: 1 story or 2 story? Lives in a one story home with a basement.         Vital Signs:  BP (!) 144/62   Pulse 68   Ht 5\' 8"  (1.727 m)   Wt 210 lb (95.3 kg)   SpO2 99%   BMI 31.93 kg/m   Neurological Exam: MENTAL STATUS including orientation to time, place, person, recent and remote memory, attention span and concentration, language, and fund of knowledge is normal.  Speech is not dysarthric.  CRANIAL NERVES: II:  Legally blind, able to perceive shadows and light.     III-IV-VI: Pupils equal round and reactive to light.  Normal conjugate, extra-ocular eye movements in all directions of gaze.  No nystagmus.  No ptosis.   V:  Normal facial sensation.    VII:  Normal facial symmetry and movements.   VIII:  Normal hearing and vestibular function.   IX-X:  Normal palatal movement.   XI:  Normal  shoulder shrug and head rotation.   XII:  Normal tongue strength and range of motion, no deviation or fasciculation.  MOTOR: Motor strength is 5/5 throughout.  No atrophy, fasciculations or abnormal movements.  No pronator drift.   MSRs:                                           Right        Left brachioradialis 2+  2+  biceps 2+  2+  triceps 2+  2+  patellar 2+  2+  ankle jerk 0  0  Hoffman no  no  plantar response down  down   SENSORY:  Reduced vibration at the ankles bilaterally.    COORDINATION/GAIT: Normal finger-to- nose-finger.  Intact rapid alternating movements bilaterally.  Gait is stable assisted with probing stick.     Thank you for allowing me to participate in patient's care.  If I can answer any additional questions, I would be pleased to do so.    Sincerely,    Denni France K. Lydia Sams, DO

## 2023-07-02 LAB — B12 AND FOLATE PANEL
Folate: >24 ng/mL
Vitamin B-12: 322 pg/mL (ref 200–1100)

## 2023-07-04 ENCOUNTER — Encounter: Payer: Self-pay | Admitting: Podiatry

## 2023-07-04 ENCOUNTER — Ambulatory Visit (INDEPENDENT_AMBULATORY_CARE_PROVIDER_SITE_OTHER): Admitting: Podiatry

## 2023-07-04 DIAGNOSIS — B351 Tinea unguium: Secondary | ICD-10-CM

## 2023-07-04 DIAGNOSIS — M79674 Pain in right toe(s): Secondary | ICD-10-CM | POA: Diagnosis not present

## 2023-07-04 DIAGNOSIS — M79675 Pain in left toe(s): Secondary | ICD-10-CM

## 2023-07-04 DIAGNOSIS — M2010 Hallux valgus (acquired), unspecified foot: Secondary | ICD-10-CM

## 2023-07-04 DIAGNOSIS — E119 Type 2 diabetes mellitus without complications: Secondary | ICD-10-CM

## 2023-07-04 NOTE — Progress Notes (Signed)
 This patient returns to my office for at risk foot care.  This patient requires this care by a professional since this patient will be at risk due to having type 2 diabetes.    This patient is unable to cut nails herself since the patient cannot reach her nails.These nails are painful walking and wearing shoes.  This patient presents for at risk foot care today.  General Appearance  Alert, conversant and in no acute stress.  Vascular  Dorsalis pedis and posterior tibial  pulses are  Weakly palpable  bilaterally.  Capillary return is within normal limits  bilaterally. Temperature is within normal limits  bilaterally.  Neurologic  Senn-Weinstein monofilament wire test within normal limits  bilaterally. Muscle power within normal limits bilaterally.  Nails Thick disfigured discolored nails with subungual debris  from hallux to fifth toes bilaterally. No evidence of bacterial infection or drainage bilaterally.  Orthopedic  No limitations of motion  feet .  No crepitus or effusions noted.  No bony pathology or digital deformities noted. Mild  HAV  B/L.  Skin  normotropic skin with no porokeratosis noted bilaterally.  No signs of infections or ulcers noted.     Onychomycosis  Pain in right toes  Pain in left toes  Consent was obtained for treatment procedures.   Mechanical debridement of nails 1-5  bilaterally performed with a nail nipper.  Filed with dremel without incident.  Callus was done as courtesy with dremel tool.   Return office visit  3 months                    Told patient to return for periodic foot care and evaluation due to potential at risk complications.   Ruffin Cotton DPM

## 2023-07-05 LAB — COLOGUARD: COLOGUARD: NEGATIVE

## 2023-09-11 NOTE — Progress Notes (Shared)
 [facility] - Clinic Note  09/18/2023     CHIEF COMPLAINT Patient presents for No chief complaint on file.   HISTORY OF PRESENT ILLNESS: Lisa Olson is a 63 y.o. female who presents to the clinic today for:    Pt states   Referring physician: Vicci Barnie NOVAK, MD [address] [place],  [place] [address]  HISTORICAL INFORMATION:   Selected notes from the MEDICAL RECORD NUMBER Referred by Dr. Glendia Gaudy for DM exam LEE: 05.22.19 (S. Groat) [BCVA: OD: HM OS: HM]  Ocular Hx-Cataract OU, Diabetic Retinopathy OU, Retinal Vein Occlusion PMH-DM (last A1C 7.2, taking Metformin ), HTN   CURRENT MEDICATIONS: No current outpatient medications on file. (Ophthalmic Drugs)   No current facility-administered medications for this visit. (Ophthalmic Drugs)   Current Outpatient Medications (Other)  Medication Sig   Accu-Chek Softclix Lancets lancets Use as instructed 3 times a day  before meals   acetaminophen -codeine  (TYLENOL  #3) 300-30 MG tablet Take 1 tablet by mouth every 8 (eight) hours as needed for moderate pain.   Ascorbic Acid (VITAMIN C ER PO) Take by mouth.   Blood Glucose Monitoring Suppl (ACCU-CHEK GUIDE ME) w/Device KIT Use to check blood sugar once daily. E11.69   Blood Glucose Monitoring Suppl (ACCU-CHEK GUIDE) w/Device KIT Use as directed 3 times a day   carvedilol  (COREG ) 6.25 MG tablet Take 1 tablet (6.25 mg total) by mouth 2 (two) times daily with a meal.   ferrous sulfate  325 (65 FE) MG tablet Take 1 tablet (325 mg total) by mouth daily with breakfast.   furosemide  (LASIX ) 20 MG tablet Take 1 tablet (20 mg total) by mouth daily. Stop Hydrochlorothiazide    glimepiride  (AMARYL ) 4 MG tablet Take 1 tablet by mouth twice daily   glucose blood (ACCU-CHEK GUIDE) test strip Use as instructed 3 times a day   ibuprofen  (ADVIL ) 800 MG tablet Take 1 tablet (800 mg total) by mouth 3 (three) times daily.   isosorbide  mononitrate (IMDUR ) 30 MG 24 hr tablet  Take 1 tablet (30 mg total) by mouth daily.   lisinopril  (ZESTRIL ) 30 MG tablet Take 1 tablet (30 mg total) by mouth daily.   metFORMIN  (GLUCOPHAGE ) 1000 MG tablet Take 1 tablet (1,000 mg total) by mouth 2 (two) times daily with a meal.   Multiple Vitamins-Minerals (MULTIVITAMIN WITH MINERALS) tablet Take 1 tablet by mouth daily.   rosuvastatin  (CRESTOR ) 10 MG tablet Take 1 tablet (10 mg total) by mouth daily.   No current facility-administered medications for this visit. 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  1-4. 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 Stable PDR OS  - s/p PPV and laser PRP OS ~2015 in [place]  - significant macular atrophy OS  - no active disease at this time  - room for PRP fill-in peripherally if needed  - monitor  6. Ocular Hypertension OU  - IOP 19 OU - discussed with patient - pt is not on any drops  7. Mixed Cataract OU - The symptoms of cataract, surgical options, and treatments and risks were discussed with patient. - discussed diagnosis and progression - discussed likelihood that CE/IOL OS may not improve visual acuity, but overall brightness of vision may improve with cataract surgery - recommend evaluation with cataract surgeon   Ophthalmic Meds Ordered this visit:  No orders of the defined types were placed in this encounter.    No follow-ups on file.  There are no Patient Instructions on file for this visit.   Explained the diagnoses, plan, and follow up with the patient and they expressed understanding.  Patient expressed understanding of the importance of proper follow up care.   This document serves as a record of services personally performed by Redell JUDITHANN Hans, MD, PhD. It was created on their behalf by Almetta Pesa, an ophthalmic technician. The creation of this record is the provider's dictation and/or activities during the visit.    Electronically signed by: Almetta Pesa, OA, 09/11/23  8:42 AM   Redell JUDITHANN Hans, M.D., Ph.D. Diseases & Surgery of the Retina and Vitreous [facility]    Abbreviations: M myopia (nearsighted); A astigmatism; H hyperopia (farsighted); P presbyopia; Mrx spectacle prescription;  CTL contact lenses; OD right eye; OS left eye; OU both eyes  XT  exotropia; ET esotropia; PEK punctate epithelial keratitis; PEE punctate epithelial erosions; DES dry eye syndrome; MGD meibomian gland dysfunction; ATs artificial tears; PFAT's preservative free artificial tears; NSC nuclear sclerotic cataract; PSC posterior subcapsular cataract; ERM epi-retinal membrane; PVD posterior vitreous detachment; RD retinal detachment; DM diabetes mellitus; DR diabetic retinopathy; NPDR non-proliferative diabetic retinopathy; PDR proliferative diabetic retinopathy; CSME clinically significant macular edema; DME diabetic macular edema; dbh dot blot hemorrhages; CWS cotton wool spot; POAG primary open angle glaucoma; C/D cup-to-disc ratio; HVF humphrey visual field; GVF goldmann visual field; OCT optical coherence tomography; IOP intraocular pressure; BRVO Branch retinal vein occlusion; CRVO central retinal vein occlusion; CRAO central retinal artery occlusion; BRAO branch retinal artery occlusion; RT retinal tear; SB scleral buckle; PPV pars plana vitrectomy; VH Vitreous hemorrhage; PRP panretinal  laser photocoagulation; IVK intravitreal kenalog; VMT vitreomacular traction; MH Macular hole;  NVD neovascularization of the disc; NVE neovascularization elsewhere; AREDS age related eye disease study; ARMD age related macular degeneration; POAG primary open angle glaucoma; EBMD epithelial/anterior basement membrane dystrophy; ACIOL anterior chamber intraocular lens; IOL intraocular lens; PCIOL posterior chamber intraocular lens; Phaco/IOL phacoemulsification with intraocular lens placement; PRK photorefractive keratectomy; LASIK laser assisted in situ keratomileusis; HTN hypertension; DM diabetes mellitus; COPD chronic obstructive pulmonary disease

## 2023-09-18 ENCOUNTER — Encounter (INDEPENDENT_AMBULATORY_CARE_PROVIDER_SITE_OTHER): Payer: Medicaid Other | Admitting: Ophthalmology

## 2023-09-18 DIAGNOSIS — H25813 Combined forms of age-related cataract, bilateral: Secondary | ICD-10-CM

## 2023-09-18 DIAGNOSIS — E113541 Type 2 diabetes mellitus with proliferative diabetic retinopathy with combined traction retinal detachment and rhegmatogenous retinal detachment, right eye: Secondary | ICD-10-CM

## 2023-09-18 DIAGNOSIS — H40053 Ocular hypertension, bilateral: Secondary | ICD-10-CM

## 2023-09-18 DIAGNOSIS — E113552 Type 2 diabetes mellitus with stable proliferative diabetic retinopathy, left eye: Secondary | ICD-10-CM

## 2023-09-18 DIAGNOSIS — H3341 Traction detachment of retina, right eye: Secondary | ICD-10-CM

## 2023-09-18 DIAGNOSIS — H21541 Posterior synechiae (iris), right eye: Secondary | ICD-10-CM

## 2023-09-18 DIAGNOSIS — H35341 Macular cyst, hole, or pseudohole, right eye: Secondary | ICD-10-CM

## 2023-09-27 ENCOUNTER — Ambulatory Visit: Admitting: Internal Medicine

## 2023-10-03 ENCOUNTER — Ambulatory Visit: Admitting: Podiatry

## 2023-10-21 ENCOUNTER — Other Ambulatory Visit: Payer: Self-pay | Admitting: Family Medicine

## 2023-10-21 ENCOUNTER — Other Ambulatory Visit: Payer: Self-pay | Admitting: Internal Medicine

## 2023-10-21 DIAGNOSIS — E118 Type 2 diabetes mellitus with unspecified complications: Secondary | ICD-10-CM

## 2023-10-22 ENCOUNTER — Other Ambulatory Visit: Payer: Self-pay | Admitting: Internal Medicine

## 2023-10-22 DIAGNOSIS — E1159 Type 2 diabetes mellitus with other circulatory complications: Secondary | ICD-10-CM

## 2023-10-22 DIAGNOSIS — E118 Type 2 diabetes mellitus with unspecified complications: Secondary | ICD-10-CM

## 2023-10-22 NOTE — Telephone Encounter (Signed)
 Copied from CRM [id number]. Topic: Clinical - Medication Refill >> Oct 22, 2023 10:22 AM Tobias L wrote: Medication: metFORMIN  (GLUCOPHAGE ) 1000 MG tablet lisinopril  (ZESTRIL ) 30 MG tablet  Has the patient contacted their pharmacy? Yes Told to contact the office for further refills.  This is the patient's preferred pharmacy:  [facility] - [place] ([place]), [place] - [address] [place] ([place]) [place] [address] Phone: [phone] Fax: [phone]  Is this the correct pharmacy for this prescription? Yes  Has the prescription been filled recently? No  Is the patient out of the medication? No  Has the patient been seen for an appointment in the last year OR does the patient have an upcoming appointment? Yes  Can we respond through MyChart? No  Agent: Please be advised that Rx refills may take up to 3 business days. We ask that you follow-up with your pharmacy.

## 2023-10-24 MED ORDER — LISINOPRIL 30 MG PO TABS: 30.0000 mg | ORAL_TABLET | Freq: Every day | ORAL | 0 refills | Status: DC

## 2023-10-24 MED ORDER — METFORMIN HCL 1000 MG PO TABS: 1000.0000 mg | ORAL_TABLET | Freq: Two times a day (BID) | ORAL | 0 refills | Status: DC

## 2023-10-24 NOTE — Telephone Encounter (Signed)
 Requested Prescriptions  Pending Prescriptions Disp Refills   metFORMIN  (GLUCOPHAGE ) 1000 MG tablet 180 tablet 0    Sig: Take 1 tablet (1,000 mg total) by mouth 2 (two) times daily with a meal.     Endocrinology:  Diabetes - Biguanides Failed - 10/24/2023  8:23 AM      Failed - Cr in normal range and within 360 days    Creat  Date Value Ref Range Status  02/13/2016 0.93 0.50 - 1.05 mg/dL Final    Comment:      For patients > or = 64 years of age: The upper reference limit for Creatinine is approximately 13% higher for people identified as African-American.      Creatinine, Ser  Date Value Ref Range Status  07/16/2022 0.92 0.44 - 1.00 mg/dL Final   Creatinine, Urine  Date Value Ref Range Status  05/14/2016 227 20 - 320 mg/dL Final         Failed - eGFR in normal range and within 360 days    GFR calc Af Amer  Date Value Ref Range Status  04/14/2020 87 >59 mL/min/1.73 Final    Comment:    **In accordance with recommendations from the NKF-ASN Task force,**   [facility] is in the process of updating its eGFR calculation to the   2021 CKD-EPI creatinine equation that estimates kidney function   without a race variable.    GFR, Estimated  Date Value Ref Range Status  07/16/2022 >60 >60 mL/min Final    Comment:    (NOTE) Calculated using the CKD-EPI Creatinine Equation (2021)    eGFR  Date Value Ref Range Status  08/22/2021 75 >59 mL/min/1.73 Final         Failed - CBC within normal limits and completed in the last 12 months    WBC  Date Value Ref Range Status  09/21/2022 6.3 3.4 - 10.8 x10E3/uL Final  07/16/2022 5.8 4.0 - 10.5 K/uL Final   RBC  Date Value Ref Range Status  09/21/2022 4.11 3.77 - 5.28 x10E6/uL Final  07/16/2022 3.87 3.87 - 5.11 MIL/uL Final   Hemoglobin  Date Value Ref Range Status  09/21/2022 10.4 (Olson) 11.1 - 15.9 g/dL Final   Hematocrit  Date Value Ref Range Status  09/21/2022 34.6 34.0 - 46.6 % Final   MCHC  Date Value Ref Range Status   09/21/2022 30.1 (Olson) 31.5 - 35.7 g/dL Final  95/70/7975 68.8 30.0 - 36.0 g/dL Final   [facility]  Date Value Ref Range Status  09/21/2022 25.3 (Olson) 26.6 - 33.0 pg Final  07/16/2022 25.8 (Olson) 26.0 - 34.0 pg Final   MCV  Date Value Ref Range Status  09/21/2022 84 79 - 97 fL Final   No results found for: PLTCOUNTKUC, LABPLAT, POCPLA RDW  Date Value Ref Range Status  09/21/2022 13.0 11.7 - 15.4 % Final         Passed - HBA1C is between 0 and 7.9 and within 180 days    HbA1c, POC (controlled diabetic range)  Date Value Ref Range Status  05/28/2023 7.8 (A) 0.0 - 7.0 % Final         Passed - B12 Level in normal range and within 720 days    Vitamin Olson-12  Date Value Ref Range Status  07/01/2023 322 200 - 1,100 pg/mL Final    Comment:    . Please Note: Although the reference range for vitamin B12 is [phone] pg/mL, it has been reported that between 5 and 10% of  patients with values between 200 and 400 pg/mL may experience neuropsychiatric and hematologic abnormalities due to occult B12 deficiency; less than 1% of patients with values above 400 pg/mL will have symptoms. Lisa Olson - Valid encounter within last 6 months    Recent Outpatient Visits           4 months ago Type 2 diabetes mellitus with obesity (HCC)   [facility] [place] Lisa Sober Olson, Lisa Olson   9 months ago Type 2 diabetes mellitus with obesity [facility])   [facility] [place] Lisa Sober Olson, Lisa Olson   12 months ago Hypertension associated with type 2 diabetes mellitus [facility])   [facility] [place] Lisa Olson, Lisa Olson   1 year ago Type 2 diabetes mellitus with obesity [facility])   [facility] [place] Lisa Sober Olson, Lisa Olson   1 year ago Controlled type 2 diabetes mellitus with  proliferative retinopathy and macular edema, without long-term current use of insulin, unspecified laterality [facility])   [facility] [place] Lisa Sober NOVAK, Lisa Olson               lisinopril  (ZESTRIL ) 30 MG tablet 90 tablet 0    Sig: Take 1 tablet (30 mg total) by mouth daily.     Cardiovascular:  ACE Inhibitors Failed - 10/24/2023  8:23 AM      Failed - Cr in normal range and within 180 days    Creat  Date Value Ref Range Status  02/13/2016 0.93 0.50 - 1.05 mg/dL Final    Comment:      For patients > or = 64 years of age: The upper reference limit for Creatinine is approximately 13% higher for people identified as African-American.      Creatinine, Ser  Date Value Ref Range Status  07/16/2022 0.92 0.44 - 1.00 mg/dL Final   Creatinine, Urine  Date Value Ref Range Status  05/14/2016 227 20 - 320 mg/dL Final         Failed - K in normal range and within 180 days    Potassium  Date Value Ref Range Status  07/16/2022 4.1 3.5 - 5.1 mmol/Olson Final         Failed - Last BP in normal range    BP Readings from Last 1 Encounters:  07/01/23 (!) 144/62         Passed - Patient is not pregnant      Passed - Valid encounter within last 6 months    Recent Outpatient Visits           4 months ago Type 2 diabetes mellitus with obesity (HCC)   [facility] [place] Lisa Sober Olson, Lisa Olson   9 months ago Type 2 diabetes mellitus with obesity [facility])   [facility] [place] Lisa Sober Olson, Lisa Olson   12 months ago Hypertension associated with type 2 diabetes mellitus [facility])   [facility] Lisa Olson, Lisa Olson   1  year ago Type 2 diabetes mellitus with obesity (HCC)   [facility] [place] Lisa Sober  Olson, Lisa Olson   1 year ago Controlled type 2 diabetes mellitus with proliferative retinopathy and macular edema, without long-term current use of insulin, unspecified laterality [facility])   [facility] [place]. [facility] Lisa Sober NOVAK, Lisa Olson

## 2023-11-05 NOTE — Telephone Encounter (Unsigned)
 Copied from CRM #[id number]. Topic: Clinical - Medication Refill >> Nov 05, 2023  9:37 AM Dedra B wrote: Medication: rosuvastatin  (CRESTOR ) 10 MG tablet  Has the patient contacted their pharmacy? No. It was supposed to be refill with her last med refill request but was left out.   This is the patient's preferred pharmacy:  [facility] - [place] ([place]), [place] - [address] [place] ([place]) [place] [address] Phone: [phone] Fax: [phone]  Is this the correct pharmacy for this prescription? Yes   Has the prescription been filled recently? No  Is the patient out of the medication? Yes  Has the patient been seen for an appointment in the last year OR does the patient have an upcoming appointment? Yes  Can we respond through MyChart? No  Agent: Please be advised that Rx refills may take up to 3 business days. We ask that you follow-up with your pharmacy.

## 2023-11-07 ENCOUNTER — Telehealth: Payer: Self-pay | Admitting: Internal Medicine

## 2023-11-07 MED ORDER — ROSUVASTATIN CALCIUM 10 MG PO TABS: 10.0000 mg | ORAL_TABLET | Freq: Every day | ORAL | 3 refills | Status: AC

## 2023-11-07 NOTE — Telephone Encounter (Signed)
 Refill sent on rosuvastatin  to her pharmacy.

## 2023-11-07 NOTE — Telephone Encounter (Signed)
 Copied from CRM #[id number]. Topic: Clinical - Medication Refill >> Nov 05, 2023  9:37 AM Dedra B wrote:  Medication: rosuvastatin  (CRESTOR ) 10 MG tablet  Has the patient contacted their pharmacy? No. It was supposed to be refill with her last med refill request but was left out.   This is the patient's preferred pharmacy:  [facility] - [place]), [place] - [address] [place] ([place]) [place] [address] Phone: [phone] Fax: [phone]  Is this the correct pharmacy for this prescription? Yes   Has the prescription been filled recently? No  Is the patient out of the medication? Yes  Has the patient been seen for an appointment in the last year OR does the patient have an upcoming appointment? Yes  Can we respond through MyChart? No  Agent: Please be advised that Rx refills may take up to 3 business days. We ask that you follow-up with your pharmacy. >> Nov 06, 2023  5:23 PM Zebedee SAUNDERS wrote: Pt calling on status of refill, mention to pt it may take up to 3 business days.

## 2023-11-07 NOTE — Telephone Encounter (Signed)
 Refill sent in today with receipt confirmed by pharmacy at 1335.    Copied from CRM [id number]. Topic: Clinical - Prescription Issue >> Nov 07, 2023  1:36 PM Amy B wrote: Reason for CRM: Patient states she has called several times regarding obtaining a refill of her rosuvastatin .  Pharmacy states they still have not received Rx.  Please advise.

## 2023-11-07 NOTE — Addendum Note (Signed)
 Addended by: VICCI SOBER B on: 11/07/2023 01:35 PM   Modules accepted: Orders

## 2023-11-25 ENCOUNTER — Other Ambulatory Visit: Payer: Self-pay | Admitting: Internal Medicine

## 2023-11-25 DIAGNOSIS — Z1231 Encounter for screening mammogram for malignant neoplasm of breast: Secondary | ICD-10-CM

## 2023-11-27 ENCOUNTER — Telehealth: Payer: Self-pay | Admitting: Internal Medicine

## 2023-11-27 NOTE — Telephone Encounter (Signed)
 Called to confirm appt for 9/11

## 2023-11-28 ENCOUNTER — Encounter: Payer: Self-pay | Admitting: Internal Medicine

## 2023-11-28 ENCOUNTER — Ambulatory Visit: Attending: Internal Medicine | Admitting: Internal Medicine

## 2023-11-28 VITALS — BP 125/78 | HR 67 | Ht 68.0 in | Wt 210.0 lb

## 2023-11-28 DIAGNOSIS — Z7984 Long term (current) use of oral hypoglycemic drugs: Secondary | ICD-10-CM

## 2023-11-28 DIAGNOSIS — H9201 Otalgia, right ear: Secondary | ICD-10-CM

## 2023-11-28 DIAGNOSIS — K029 Dental caries, unspecified: Secondary | ICD-10-CM | POA: Diagnosis not present

## 2023-11-28 DIAGNOSIS — E785 Hyperlipidemia, unspecified: Secondary | ICD-10-CM | POA: Diagnosis not present

## 2023-11-28 DIAGNOSIS — I2510 Atherosclerotic heart disease of native coronary artery without angina pectoris: Secondary | ICD-10-CM

## 2023-11-28 DIAGNOSIS — Z2821 Immunization not carried out because of patient refusal: Secondary | ICD-10-CM | POA: Diagnosis not present

## 2023-11-28 DIAGNOSIS — E1159 Type 2 diabetes mellitus with other circulatory complications: Secondary | ICD-10-CM

## 2023-11-28 DIAGNOSIS — I152 Hypertension secondary to endocrine disorders: Secondary | ICD-10-CM | POA: Diagnosis not present

## 2023-11-28 DIAGNOSIS — E1169 Type 2 diabetes mellitus with other specified complication: Secondary | ICD-10-CM

## 2023-11-28 DIAGNOSIS — Z79899 Other long term (current) drug therapy: Secondary | ICD-10-CM

## 2023-11-28 DIAGNOSIS — E118 Type 2 diabetes mellitus with unspecified complications: Secondary | ICD-10-CM | POA: Diagnosis not present

## 2023-11-28 LAB — POCT GLYCOSYLATED HEMOGLOBIN (HGB A1C): HbA1c, POC (controlled diabetic range): 6.6 % (ref 0.0–7.0)

## 2023-11-28 LAB — GLUCOSE, POCT (MANUAL RESULT ENTRY): POC Glucose: 93 mg/dL (ref 70–99)

## 2023-11-28 MED ORDER — METFORMIN HCL 1000 MG PO TABS: 1000.0000 mg | ORAL_TABLET | Freq: Two times a day (BID) | ORAL | 0 refills | Status: DC

## 2023-11-28 MED ORDER — GLIMEPIRIDE 4 MG PO TABS: 4.0000 mg | ORAL_TABLET | Freq: Two times a day (BID) | ORAL | 1 refills | Status: DC

## 2023-11-28 MED ORDER — LISINOPRIL 30 MG PO TABS: 30.0000 mg | ORAL_TABLET | Freq: Every day | ORAL | 1 refills | Status: AC

## 2023-11-28 NOTE — Progress Notes (Signed)
 Patient ID: Lisa Olson, female    DOB: Nov 04, 1960  MRN: [id number]  CC: Diabetes (DM f/u. Med refill. /No questions / concerns/No to flu vax)   Subjective: Lisa Olson is a 63 y.o. female who presents for chronic ds management. 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  History of Present Illness Lisa Olson is a 63 year old female who presents for follow-up of her chronic conditions.  HL: She takes rosuvastatin  10 mg every other day for hyperlipidemia, which she tolerates better than daily dosing. Last LDL cholesterol was 119 in July of last year. she has been making dietary changes to improve her cholesterol levels. She experiences memory problems and incoherence when lying on her back, which she attributes to the medication.  Relative doing well on Fenofibrate and she wonders about switching to that med.  DM: Results for orders placed or performed in visit on 11/28/23  POCT glucose (manual entry)   Collection Time: 11/28/23  2:07 PM  Result Value Ref Range   POC Glucose 93 70 - 99 mg/dl  POCT glycosylated hemoglobin (Hb A1C)   Collection Time: 11/28/23  2:13 PM  Result Value Ref Range   Hemoglobin A1C     HbA1c POC (<> result, manual entry)     HbA1c, POC (prediabetic range)     HbA1c, POC (controlled diabetic range) 6.6 0.0 - 7.0 %  she is on metformin  1000 mg twice a day and glimepiride  4 mg twice a day. Her A1c has improved from 7.8% to 6.6% due to dietary changes, including cutting out sugary drinks and switching to water and almond milk. She checks her blood sugar three to four times a day, with readings typically between 90 and 150 mg/dL.  She will consider 3 times a week in her neighborhood.  HTN: She takes  carvedilol  6.25 mg twice a day, isosorbide  30 mg once a day, furosemide  20 mg daily, and lisinopril . She monitors her blood pressure at home, usually around 130/73 mmHg, but it occasionally rises above 150 mmHg, prompting her to exercise. She limits salt intake and reports no chest pain or shortness of breath during exercise.  She has been experiencing right ear pain, particularly at night, for a couple of weeks, which she suspects may be related to dental issues. She reports that her dentist told her a few months ago that she has several decayed teeth that need extraction.  HM: She declines flu and pneumonia vaccine.    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, associated with type 2 diabetes mellitus (HCC) 05/22/2018   Nuclear sclerotic cataract of both eyes 02/11/2018   Retinal edema 02/11/2018   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 02/11/2018   Vitreous hemorrhage, right eye (HCC) 02/11/2018   Legal blindness 07/25/2017   Type 2 diabetes mellitus with retinopathy of both eyes, with long-term current use of insulin (HCC) 02/14/2016   Essential hypertension 02/14/2016   Disorder of skin or subcutaneous tissue 04/24/2013   Onychomycosis due to dermatophyte 03/25/2013   Hallux valgus, acquired 03/25/2013     Current Outpatient Medications on File Prior to  Visit  Medication Sig Dispense Refill   Accu-Chek Softclix Lancets lancets Use as instructed 3 times a day  before meals 100 each 12   acetaminophen -codeine  (TYLENOL  #3) 300-30 MG tablet Take 1 tablet by mouth every 8 (eight) hours as needed for moderate pain. 15 tablet 0   Ascorbic Acid (VITAMIN C ER PO) Take by mouth.     Blood Glucose Monitoring Suppl (ACCU-CHEK GUIDE ME) w/Device KIT Use to check blood sugar once daily. E11.69 1 kit 0   Blood Glucose Monitoring Suppl (ACCU-CHEK GUIDE) w/Device KIT Use as directed 3 times a day 1 kit 0   carvedilol  (COREG ) 6.25 MG tablet Take 1 tablet (6.25 mg total) by mouth 2 (two) times daily with a meal. 180 tablet 3   ferrous sulfate  325 (65 FE) MG tablet Take 1 tablet (325 mg total) by mouth daily with breakfast. 100 tablet 3   furosemide  (LASIX ) 20 MG tablet Take 1 tablet (20 mg total) by mouth daily. Stop Hydrochlorothiazide  90 tablet 2   glucose blood (ACCU-CHEK GUIDE TEST) test strip USE 1 STRIP TO CHECK GLUCOSE THREE TIMES DAILY AS DIRECTED 100 each 2   ibuprofen  (ADVIL ) 800 MG tablet Take 1 tablet (800 mg total) by mouth 3 (three) times daily. 21 tablet 0   isosorbide  mononitrate (IMDUR ) 30 MG 24 hr tablet Take 1 tablet (30 mg total) by mouth daily. 90 tablet 3   Multiple Vitamins-Minerals (MULTIVITAMIN WITH MINERALS) tablet Take 1 tablet by mouth daily.     rosuvastatin  (CRESTOR ) 10 MG tablet Take 1 tablet (10 mg total) by mouth daily. 90 tablet 3   No current facility-administered medications on file prior to visit.    No Known Allergies  Social History   Socioeconomic History   Marital status: Single    Spouse name: Not on file   Number of children: Not on file   Years of education: Not on file   Highest education level: Not on file  Occupational History   Not on file  Tobacco Use   Smoking status: Never   Smokeless tobacco: Never  Vaping Use   Vaping status: Never Used  Substance and Sexual Activity   Alcohol use: Not Currently    Drug use: No   Sexual activity: Not on file  Other Topics Concern   Not on file  Social History Narrative   Are you right handed or left handed? Right Handed    Are you currently employed ? No    What is your current occupation? No    Do you live at home alone? Yes    Who lives with you?    What type of home do you live in: 1 story or 2 story? Lives in a one story home with a basement.        Social Drivers of Health   Financial Resource Strain: Medium Risk (11/09/2020)   Overall Financial Resource Strain (CARDIA)    Difficulty of Paying Living Expenses: Somewhat hard  Food Insecurity: Food Insecurity Present (01/15/2022)   Hunger Vital Sign  Worried About Programme researcher, broadcasting/film/video in the Last Year: Sometimes true    [employer] of Food in the Last Year: Sometimes true  Transportation Needs: Unmet Transportation Needs (01/15/2022)   PRAPARE - Administrator, Civil Service (Medical): Yes    Lack of Transportation (Non-Medical): Yes  Physical Activity: Not on file  Stress: Not on file  Social Connections: Moderately Integrated (04/12/2021)   Social Connection and Isolation Panel    Frequency of Communication with Friends and Family: More than three times a week    Frequency of Social Gatherings with Friends and Family: Once a week    Attends Religious Services: More than 4 times per year    Active Member of [employer] or Organizations: Yes    Attends Engineer, structural: More than 4 times per year    Marital Status: Separated  Intimate Partner Violence: Not on file    Family History  Problem Relation Age of Onset   Diabetes Mother    Diabetes Father    Hypertension Sister     Past Surgical History:  Procedure Laterality Date   EYE SURGERY     LASIK     RETINAL DETACHMENT SURGERY      ROS: Review of Systems Negative except as stated above  PHYSICAL EXAM: BP 127/81 (BP Location: Left Arm, Patient Position: Sitting, Cuff Size: Large)   Pulse 67   Ht 5'  8 (1.727 m)   Wt 210 lb (95.3 kg)   SpO2 100%   BMI 31.93 kg/m   Physical Exam  General appearance - alert, well appearing, older female and in no distress.  She ambulates with a cane Mental status - normal mood, behavior, speech, dress, motor activity, and thought processes Neck - supple, no significant adenopathy Mouth: She has several decayed teeth in both the upper and lower jaw Ears: Both ear canal and tympanic membrane are within normal limits Chest - clear to auscultation, no wheezes, rales or rhonchi, symmetric air entry Heart - normal rate, regular rhythm, normal S1, S2, no murmurs, rubs, clicks or gallops Extremities - peripheral pulses normal, no pedal edema, no clubbing or cyanosis      Latest Ref Rng & Units 07/16/2022    8:39 PM 08/22/2021    4:55 PM 04/14/2020    2:02 PM  CMP  Glucose 70 - 99 mg/dL 850  847  759   BUN 8 - 23 mg/dL 15  15  14    Creatinine 0.44 - 1.00 mg/dL 9.07  9.12  9.15   Sodium 135 - 145 mmol/L 138  141  138   Potassium 3.5 - 5.1 mmol/L 4.1  4.1  4.3   Chloride 98 - 111 mmol/L 102  99  99   CO2 22 - 32 mmol/L 27  27  26    Calcium  8.9 - 10.3 mg/dL 8.8  9.5  9.2   Total Protein 6.5 - 8.1 g/dL 6.7  8.0  7.4   Total Bilirubin 0.3 - 1.2 mg/dL 0.5  0.3  0.2   Alkaline Phos 38 - 126 U/L 48  66  77   AST 15 - 41 U/L 19  15  15    ALT 0 - 44 U/L 19  17  20     Lipid Panel     Component Value Date/Time   CHOL 199 09/21/2022 0958   TRIG 217 (H) 09/21/2022 0958   HDL 42 09/21/2022 0958   CHOLHDL 4.7 (H) 09/21/2022 0958   CHOLHDL  4.7 05/14/2016 1607   VLDL 44 (H) 05/14/2016 1607   LDLCALC 119 (H) 09/21/2022 0958    CBC    Component Value Date/Time   WBC 6.3 09/21/2022 0958   WBC 5.8 07/16/2022 2039   RBC 4.11 09/21/2022 0958   RBC 3.87 07/16/2022 2039   HGB 10.4 (L) 09/21/2022 0958   HCT 34.6 09/21/2022 0958   PLT 279 09/21/2022 0958   MCV 84 09/21/2022 0958   MCH 25.3 (L) 09/21/2022 0958   MCH 25.8 (L) 07/16/2022 2039   MCHC 30.1 (L)  09/21/2022 0958   MCHC 31.1 07/16/2022 2039   RDW 13.0 09/21/2022 0958   LYMPHSABS 2.3 07/16/2022 2039   MONOABS 0.6 07/16/2022 2039   EOSABS 0.1 07/16/2022 2039   BASOSABS 0.0 07/16/2022 2039    ASSESSMENT AND PLAN: 1. Controlled type 2 diabetes mellitus with complication, without long-term current use of insulin (HCC) (Primary) At goal.  Patient to continue 4 mg twice a day and metformin  1 g twice a day.  Encouraged her to continue healthy eating habits and regular exercise. - CBC - Comprehensive metabolic panel with GFR - Lipid panel - Microalbumin / creatinine urine ratio - POCT glycosylated hemoglobin (Hb A1C) - POCT glucose (manual entry) - glimepiride  (AMARYL ) 4 MG tablet; Take 1 tablet (4 mg total) by mouth 2 (two) times daily.  Dispense: 180 tablet; Refill: 1 - metFORMIN  (GLUCOPHAGE ) 1000 MG tablet; Take 1 tablet (1,000 mg total) by mouth 2 (two) times daily with a meal.  Dispense: 180 tablet; Refill: 0  2. Hypertension associated with type 2 diabetes mellitus (HCC) At goal.  Continue lisinopril  30 mg daily, carvedilol  6.25 mg twice a day, furosemide  20 mg daily and isosorbide  30 mg daily. - lisinopril  (ZESTRIL ) 30 MG tablet; Take 1 tablet (30 mg total) by mouth daily.  Dispense: 90 tablet; Refill: 1  3. Coronary artery disease involving native coronary artery of native heart without angina pectoris Stable. Continue carvedilol  and statin therapy  4. Hyperlipidemia associated with type 2 diabetes mellitus (HCC) Advised patient that fenofibrate is used to treat hypertriglyceridemia.  She has heart disease and other risk factors including diabetes for which statin is indicated not fenofibrate.  Reviews have not shown statins to cause significant memory issues as once thought.  5. Right ear pain Reassurance given.  Ear canal and tympanic membrane within normal limits.  6. Dental caries Strongly advised that she gets in with her dentist.  Looks like she would have to have  several of her teeth removed.  7. Influenza vaccination declined   8. Pneumococcal vaccination declined   Patient was given the opportunity to ask questions.  Patient verbalized understanding of the plan and was able to repeat key elements of the plan.   This documentation was completed using Paediatric nurse.  Any transcriptional errors are unintentional.  Orders Placed This Encounter  Procedures   CBC   Comprehensive metabolic panel with GFR   Lipid panel   Microalbumin / creatinine urine ratio   POCT glycosylated hemoglobin (Hb A1C)   POCT glucose (manual entry)     Requested Prescriptions   Signed Prescriptions Disp Refills   glimepiride  (AMARYL ) 4 MG tablet 180 tablet 1    Sig: Take 1 tablet (4 mg total) by mouth 2 (two) times daily.   lisinopril  (ZESTRIL ) 30 MG tablet 90 tablet 1    Sig: Take 1 tablet (30 mg total) by mouth daily.   metFORMIN  (GLUCOPHAGE ) 1000 MG tablet 180 tablet 0  Sig: Take 1 tablet (1,000 mg total) by mouth 2 (two) times daily with a meal.    Return in about 4 months (around 03/29/2024).  Barnie Louder, MD, FACP

## 2023-11-28 NOTE — Progress Notes (Signed)
 [facility] - Clinic Note  12/03/2023     CHIEF COMPLAINT Patient presents for Retina Follow Up   HISTORY OF PRESENT ILLNESS: Lisa Olson is a 63 y.o. female who presents to the clinic today for:   HPI     Retina Follow Up   Patient presents with  Diabetic Retinopathy.  In both eyes.  This started 1 year ago.  I, the attending physician,  performed the HPI with the patient and updated documentation appropriately.        Comments   Patient states the vision is the same. She is not using eye drops. She is unable to check her blood sugar at this time.       Last edited by Valdemar Rogue, MD on 12/10/2023 10:12 PM.    Pt states she's doing well, same old same old. Not currently seeing Groat. No eye pain. No longer using drops.   Referring physician: Vicci Barnie NOVAK, MD [address] [place],  [place] [address]  HISTORICAL INFORMATION:   Selected notes from the MEDICAL RECORD NUMBER Referred by Dr. Glendia Gaudy for DM exam LEE: 05.22.19 (S. Groat) [BCVA: OD: HM OS: HM]  Ocular Hx-Cataract OU, Diabetic Retinopathy OU, Retinal Vein Occlusion PMH-DM (last A1C 7.2, taking Metformin ), HTN   CURRENT MEDICATIONS: No current outpatient medications on file. (Ophthalmic Drugs)   No current facility-administered medications for this visit. (Ophthalmic Drugs)   Current Outpatient Medications (Other)  Medication Sig   Accu-Chek Softclix Lancets lancets Use as instructed 3 times a day  before meals   acetaminophen -codeine  (TYLENOL  #3) 300-30 MG tablet Take 1 tablet by mouth every 8 (eight) hours as needed for moderate pain.   Ascorbic Acid (VITAMIN C ER PO) Take by mouth.   Blood Glucose Monitoring Suppl (ACCU-CHEK GUIDE ME) w/Device KIT Use to check blood sugar once daily. E11.69   Blood Glucose Monitoring Suppl (ACCU-CHEK GUIDE) w/Device KIT Use as directed 3 times a day   carvedilol  (COREG ) 6.25 MG tablet Take 1 tablet (6.25 mg total) by mouth 2 (two)  times daily with a meal.   ferrous sulfate  325 (65 FE) MG tablet Take 1 tablet (325 mg total) by mouth daily with breakfast.   furosemide  (LASIX ) 20 MG tablet Take 1 tablet (20 mg total) by mouth daily. Stop Hydrochlorothiazide    glimepiride  (AMARYL ) 4 MG tablet Take 1 tablet (4 mg total) by mouth 2 (two) times daily.   glucose blood (ACCU-CHEK GUIDE TEST) test strip USE 1 STRIP TO CHECK GLUCOSE THREE TIMES DAILY AS DIRECTED   ibuprofen  (ADVIL ) 800 MG tablet Take 1 tablet (800 mg total) by mouth 3 (three) times daily.   isosorbide  mononitrate (IMDUR ) 30 MG 24 hr tablet Take 1 tablet (30 mg total) by mouth daily.   lisinopril  (ZESTRIL ) 30 MG tablet Take 1 tablet (30 mg total) by mouth daily.   metFORMIN  (GLUCOPHAGE ) 1000 MG tablet Take 1 tablet (1,000 mg total) by mouth 2 (two) times daily with a meal.   Multiple Vitamins-Minerals (MULTIVITAMIN WITH MINERALS) tablet Take 1 tablet by mouth daily.   rosuvastatin  (CRESTOR ) 10 MG tablet Take 1 tablet (10 mg total) by mouth daily.   No current facility-administered medications for this visit. 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)    Past Surgical History:  Procedure Laterality Date   EYE SURGERY     LASIK     RETINAL DETACHMENT SURGERY      FAMILY HISTORY Family History  Problem Relation Age of Onset   Diabetes Mother    Diabetes Father    Hypertension Sister    BRCA 1/2 Neg Hx    Breast cancer Neg Hx     SOCIAL HISTORY Social History   Tobacco Use   Smoking status: Never   Smokeless tobacco: Never  Vaping Use   Vaping status: Never Used  Substance Use Topics   Alcohol use: Not Currently   Drug use: No       OPHTHALMIC EXAM:  Base Eye Exam     Visual Acuity (Snellen - Linear)       Right Left    Dist [place] LP CF at 3'   Dist ph [place]  NI         Tonometry (Tonopen, 8:21 AM)       Right Left   Pressure 21 20         Pupils       Dark Light Shape React APD   Right 2 1 Irregular Minimal None   Left 2 1 Round Minimal None         Visual Fields       Left Right   Restrictions Total superior temporal, inferior temporal, superior nasal, inferior nasal deficiencies Total superior temporal, inferior temporal, superior nasal, inferior nasal deficiencies         Neuro/Psych     Oriented x3: Yes   Mood/Affect: Normal         Dilation     Both eyes: 1.0% Mydriacyl, 2.5% Phenylephrine @ 8:18 AM           Slit Lamp and Fundus Exam     Slit Lamp Exam       Right Left   Lids/Lashes Dermatochalasis - upper lid, mild MGD Mild Meibomian gland dysfunction, Dermatochalasis   Conjunctiva/Sclera Mild Melanosis, nasal and temporal pinguecula Nasal and Temporal Pinguecula, mild Melanosis   Cornea Clear Mild Arcus, trace Punctate epithelial erosions, trace ear film debris   Anterior Chamber Shallow with IK touch from 0600-1200 temporally, moderate depth, closed angles Moderate depth and narrow angles   Iris almost 360 Posterior synechiae with pupil size approx 2mm, anterior bowing Round and dilated, No NVI   Lens Mature white cataract 2-3+ Nuclear sclerosis with brunescence, 2-3+ Cortical cataract, 1-2+ Posterior subcapsular cataract, Vacuoles   Anterior Vitreous no view Mild Vitreous syneresis         Fundus Exam       Right Left   Disc No view 2-3+Pallor, mild fibrosis, temporal PPA, Sharp rim, severely attenuated vessels, no NVD   C/D Ratio  0.2   Macula No view Flat, atrophic, fibrosis and pigmented scarring temporal half of macula   Vessels No view Severe attenuation, copper wiring, AV crossing changes, no NV   Periphery No view Attached, 360 PRP, fibrosis nasal to disc, room for PRP fill-in superiorly, no heme            IMAGING AND PROCEDURES  Imaging  and Procedures for @TODAY @  OCT, Retina - OU - Both Eyes       Right Eye Quality was (uninterruptable ). Findings include preretinal fibrosis (No images obtained).   Left Eye Quality was good. Central Foveal Thickness: 208. Progression has been stable.  Findings include no IRF, no SRF, abnormal foveal contour, retinal drusen , subretinal hyper-reflective material, epiretinal membrane, lamellar hole, outer retinal atrophy (Sub-retinal fibrosis/SRHM temporal macula caught on widefield, no fluid or edema).   Notes *Images captured and stored on drive  Diagnosis / Impression:  OD: no images obtained OS: diffuse retinal atrophy, ERM with lamellar hole--stable, no fluid or edema  Clinical management:  See below  Abbreviations: NFP - Normal foveal profile. CME - cystoid macular edema. PED - pigment epithelial detachment. IRF - intraretinal fluid. SRF - subretinal fluid. EZ - ellipsoid zone. ERM - epiretinal membrane. ORA - outer retinal atrophy. ORT - outer retinal tubulation. SRHM - subretinal hyper-reflective material              ASSESSMENT/PLAN:    ICD-10-CM   1. 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 Macular hole of right eye  H35.341     3. Retinal detachment, tractional, right eye  H33.41     4. Posterior synechiae of iris, right  H21.541     5. Stable proliferative diabetic retinopathy of left eye associated with type 2 diabetes mellitus (HCC)  Z88.6447     6. Bilateral ocular hypertension  H40.053     7. Combined forms of age-related cataract of both eyes  H25.813       1-4. 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D  - pt with long standing history of low vision / legal blindness  - onset of vision loss ~2015 in [place] -- underwent PRP laser OD, PPV OS   - today,  reports no acute change in vision OD, but prior exam showed persistent complete macular detachment with macular hole -- wolf-jaw fibrosis emanating from the disc; periphery attached with good PRP in place  - discussed findings, very guarded prognosis, and treatment options  - given longstanding VA of HM OD, unlikely that anatomic reattachment of retina will yield significantly improved vision  - discussed possibility of surgery making vision worse  - pt wishes to monitor for now  - continue Durezol  PRN OD-no longer using  - f/u here in 1 year, sooner prn, to monitor for any acute changes in symptoms or exam  5. Stable PDR OS  - s/p PPV and laser PRP OS ~2015 in [place]  - significant macular atrophy OS  - no active disease at this time  - room for PRP fill-in peripherally if needed  - monitor  6. Ocular Hypertension OU  - IOP 21,20 - discussed with patient - pt is not on any drops  7. Mixed Cataract OU - The symptoms of cataract, surgical options, and treatments and risks were discussed with patient. - discussed diagnosis and progression - discussed likelihood that CE/IOL OS may not improve visual acuity, but overall brightness of vision may improve with cataract surgery   Ophthalmic Meds Ordered this visit:  No orders of the defined types were placed in this encounter.    Return in about 1 year (around 12/02/2024) for PDR OU/mac hole OD, DFE, OCT.  There are no Patient Instructions on file for this visit.   Explained the diagnoses, plan, and follow up with the patient and they expressed understanding.  Patient expressed understanding of the importance of proper follow up care.   This document serves as a record of services personally performed  by Redell JUDITHANN Hans, MD, PhD. It was created on their behalf by Wanda GEANNIE Keens, COT an ophthalmic technician. The creation of this record is the provider's dictation and/or activities during the visit.    Electronically signed by:   Wanda GEANNIE Keens, COT  12/10/23 10:13 PM  This document serves as a record of services personally performed by Redell JUDITHANN Hans, MD, PhD. It was created on their behalf by Almetta Pesa, an ophthalmic technician. The creation of this record is the provider's dictation and/or activities during the visit.    Electronically signed by: Almetta Pesa, OA, 12/10/23  10:13 PM  Redell JUDITHANN Hans, M.D., Ph.D. Diseases & Surgery of the Retina and Vitreous [facility]  I have reviewed the above documentation for accuracy and completeness, and I agree with the above. Redell JUDITHANN Hans, M.D., Ph.D. 12/10/23 10:14 PM   Abbreviations: M myopia (nearsighted); A astigmatism; H hyperopia (farsighted); P presbyopia; Mrx spectacle prescription;  CTL contact lenses; OD right eye; OS left eye; OU both eyes  XT exotropia; ET esotropia; PEK punctate epithelial keratitis; PEE punctate epithelial erosions; DES dry eye syndrome; MGD meibomian gland dysfunction; ATs artificial tears; PFAT's preservative free artificial tears; NSC nuclear sclerotic cataract; PSC posterior subcapsular cataract; ERM epi-retinal membrane; PVD posterior vitreous detachment; RD retinal detachment; DM diabetes mellitus; DR diabetic retinopathy; NPDR non-proliferative diabetic retinopathy; PDR proliferative diabetic retinopathy; CSME clinically significant macular edema; DME diabetic macular edema; dbh dot blot hemorrhages; CWS cotton wool spot; POAG primary open angle glaucoma; C/D cup-to-disc ratio; HVF humphrey visual field; GVF goldmann visual field; OCT optical coherence tomography; IOP intraocular pressure; BRVO Branch retinal vein occlusion; CRVO central retinal vein occlusion; CRAO central retinal artery occlusion; BRAO branch retinal artery occlusion; RT retinal tear; SB scleral buckle; PPV pars plana vitrectomy; VH Vitreous hemorrhage; PRP panretinal laser photocoagulation; IVK intravitreal kenalog; VMT vitreomacular  traction; MH Macular hole;  NVD neovascularization of the disc; NVE neovascularization elsewhere; AREDS age related eye disease study; ARMD age related macular degeneration; POAG primary open angle glaucoma; EBMD epithelial/anterior basement membrane dystrophy; ACIOL anterior chamber intraocular lens; IOL intraocular lens; PCIOL posterior chamber intraocular lens; Phaco/IOL phacoemulsification with intraocular lens placement; PRK photorefractive keratectomy; LASIK laser assisted in situ keratomileusis; HTN hypertension; DM diabetes mellitus; COPD chronic obstructive pulmonary disease

## 2023-11-28 NOTE — Patient Instructions (Signed)
 VISIT SUMMARY:  Today, we reviewed your chronic conditions and discussed your current medications and lifestyle changes. We also addressed your recent right ear pain and potential dental issues.  YOUR PLAN:  -DYSLIPIDEMIA: Dyslipidemia means having an abnormal amount of lipids (e.g., cholesterol) in your blood. Your LDL cholesterol is currently 119 mg/dL, and our target is less than 55 mg/dL. We will order a lipid panel to check your current levels. If your LDL cholesterol remains high, we may refer you to a lipid clinic to explore other treatment options, including injectable medications.  -HYPERTENSION: Hypertension is high blood pressure. Your blood pressure is generally around 130/73 mmHg but occasionally rises above 150 mmHg. Continue taking your current medications: carvedilol  6.25 mg twice daily, isosorbide  30 mg daily, furosemide  20 mg daily, and lisinopril . Keep monitoring your blood pressure regularly, exercise, and limit your salt intake.  -TYPE 2 DIABETES MELLITUS: Type 2 diabetes is a condition that affects how your body processes blood sugar. Your A1c has improved from 7.8% to 6.6%, which is great. Continue taking metformin  1000 mg twice daily and glimepiride  4 mg twice daily. Keep up with your dietary changes and regular exercise, and continue to check your blood glucose levels regularly.  -DENTAL CARIES: Dental caries are decayed areas of your teeth that can cause pain and other issues. You have several decayed teeth that may be causing your right ear pain. We recommend seeing a dentist for evaluation and possible extraction of the decayed teeth if your insurance covers it.  -RIGHT EAR PAIN: Your right ear pain may be referred pain from your dental issues. Since there is no infection or wax buildup, addressing your dental caries may help alleviate the ear pain.  INSTRUCTIONS:  Please follow up with a dental evaluation for your decayed teeth. We will also order a lipid panel to check  your cholesterol levels. Continue monitoring your blood pressure and blood glucose levels regularly. Keep up with your exercise and dietary changes.

## 2023-11-29 ENCOUNTER — Ambulatory Visit
Admission: RE | Admit: 2023-11-29 | Discharge: 2023-11-29 | Disposition: A | Discharge status: Home / Self Care | Source: Ambulatory Visit | Attending: Internal Medicine | Admitting: Internal Medicine

## 2023-11-29 DIAGNOSIS — Z1231 Encounter for screening mammogram for malignant neoplasm of breast: Secondary | ICD-10-CM

## 2023-11-30 ENCOUNTER — Ambulatory Visit: Payer: Self-pay | Admitting: Internal Medicine

## 2023-11-30 LAB — MICROALBUMIN / CREATININE URINE RATIO
Creatinine, Urine: 119.9 mg/dL
Microalb/Creat Ratio: 4 mg/g{creat} (ref 0–29)
Microalbumin, Urine: 5 ug/mL

## 2023-11-30 LAB — COMPREHENSIVE METABOLIC PANEL WITH GFR
ALT: 18 IU/L (ref 0–32)
AST: 19 IU/L (ref 0–40)
Albumin: 4.1 g/dL (ref 3.9–4.9)
Alkaline Phosphatase: 43 IU/L — ABNORMAL LOW (ref 44–121)
BUN/Creatinine Ratio: 15 (ref 12–28)
BUN: 16 mg/dL (ref 8–27)
Bilirubin Total: 0.3 mg/dL (ref 0.0–1.2)
CO2: 22 mmol/L (ref 20–29)
Calcium: 9.5 mg/dL (ref 8.7–10.3)
Chloride: 99 mmol/L (ref 96–106)
Creatinine, Ser: 1.06 mg/dL — ABNORMAL HIGH (ref 0.57–1.00)
Globulin, Total: 3.3 g/dL (ref 1.5–4.5)
Glucose: 66 mg/dL — ABNORMAL LOW (ref 70–99)
Potassium: 4.4 mmol/L (ref 3.5–5.2)
Sodium: 138 mmol/L (ref 134–144)
Total Protein: 7.4 g/dL (ref 6.0–8.5)
eGFR: 59 mL/min/1.73 — ABNORMAL LOW (ref >59)

## 2023-11-30 LAB — LIPID PANEL
Chol/HDL Ratio: 3.1 ratio (ref 0.0–4.4)
Cholesterol, Total: 146 mg/dL (ref 100–199)
HDL: 47 mg/dL (ref >39)
LDL Chol Calc (NIH): 78 mg/dL (ref 0–99)
Triglycerides: 118 mg/dL (ref 0–149)
VLDL Cholesterol Cal: 21 mg/dL (ref 5–40)

## 2023-11-30 LAB — CBC
Hematocrit: 33.2 % — ABNORMAL LOW (ref 34.0–46.6)
Hemoglobin: 10.1 g/dL — ABNORMAL LOW (ref 11.1–15.9)
MCH: 25.6 pg — ABNORMAL LOW (ref 26.6–33.0)
MCHC: 30.4 g/dL — ABNORMAL LOW (ref 31.5–35.7)
MCV: 84 fL (ref 79–97)
Platelets: 329 x10E3/uL (ref 150–450)
RBC: 3.94 x10E6/uL (ref 3.77–5.28)
RDW: 12.3 % (ref 11.7–15.4)
WBC: 6 x10E3/uL (ref 3.4–10.8)

## 2023-12-02 ENCOUNTER — Ambulatory Visit (INDEPENDENT_AMBULATORY_CARE_PROVIDER_SITE_OTHER): Admitting: Podiatry

## 2023-12-02 ENCOUNTER — Encounter: Payer: Self-pay | Admitting: Podiatry

## 2023-12-02 DIAGNOSIS — M79674 Pain in right toe(s): Secondary | ICD-10-CM

## 2023-12-02 DIAGNOSIS — E119 Type 2 diabetes mellitus without complications: Secondary | ICD-10-CM

## 2023-12-02 DIAGNOSIS — B351 Tinea unguium: Secondary | ICD-10-CM

## 2023-12-02 DIAGNOSIS — M79675 Pain in left toe(s): Secondary | ICD-10-CM

## 2023-12-02 NOTE — Progress Notes (Signed)
 This patient returns to my office for at risk foot care.  This patient requires this care by a professional since this patient will be at risk due to having type 2 diabetes.    This patient is unable to cut nails herself since the patient cannot reach her nails.These nails are painful walking and wearing shoes.  This patient presents for at risk foot care today.  General Appearance  Alert, conversant and in no acute stress.  Vascular  Dorsalis pedis and posterior tibial  pulses are  Weakly palpable  bilaterally.  Capillary return is within normal limits  bilaterally. Temperature is within normal limits  bilaterally.  Neurologic  Senn-Weinstein monofilament wire test within normal limits  bilaterally. Muscle power within normal limits bilaterally.  Nails Thick disfigured discolored nails with subungual debris  from hallux to fifth toes bilaterally. No evidence of bacterial infection or drainage bilaterally.  Orthopedic  No limitations of motion  feet .  No crepitus or effusions noted.  No bony pathology or digital deformities noted. Mild  HAV  B/L.  Skin  normotropic skin with no porokeratosis noted bilaterally.  No signs of infections or ulcers noted.     Onychomycosis  Pain in right toes  Pain in left toes  Consent was obtained for treatment procedures.   Mechanical debridement of nails 1-5  bilaterally performed with a nail nipper.  Filed with dremel without incident.  Callus was done as courtesy with dremel tool.   Return office visit  3 months                    Told patient to return for periodic foot care and evaluation due to potential at risk complications.   Ruffin Cotton DPM

## 2023-12-03 ENCOUNTER — Ambulatory Visit (INDEPENDENT_AMBULATORY_CARE_PROVIDER_SITE_OTHER): Admitting: Ophthalmology

## 2023-12-03 DIAGNOSIS — E113541 Type 2 diabetes mellitus with proliferative diabetic retinopathy with combined traction retinal detachment and rhegmatogenous retinal detachment, right eye: Secondary | ICD-10-CM

## 2023-12-03 DIAGNOSIS — H35341 Macular cyst, hole, or pseudohole, right eye: Secondary | ICD-10-CM

## 2023-12-03 DIAGNOSIS — E113552 Type 2 diabetes mellitus with stable proliferative diabetic retinopathy, left eye: Secondary | ICD-10-CM

## 2023-12-03 DIAGNOSIS — H3341 Traction detachment of retina, right eye: Secondary | ICD-10-CM

## 2023-12-03 DIAGNOSIS — H40053 Ocular hypertension, bilateral: Secondary | ICD-10-CM

## 2023-12-03 DIAGNOSIS — H25813 Combined forms of age-related cataract, bilateral: Secondary | ICD-10-CM

## 2023-12-03 DIAGNOSIS — H21541 Posterior synechiae (iris), right eye: Secondary | ICD-10-CM

## 2023-12-04 ENCOUNTER — Ambulatory Visit: Payer: Self-pay | Admitting: Internal Medicine

## 2023-12-10 ENCOUNTER — Encounter (INDEPENDENT_AMBULATORY_CARE_PROVIDER_SITE_OTHER): Payer: Self-pay | Admitting: Ophthalmology

## 2024-02-17 ENCOUNTER — Other Ambulatory Visit: Payer: Self-pay | Admitting: Internal Medicine

## 2024-02-17 ENCOUNTER — Ambulatory Visit: Payer: Self-pay

## 2024-02-17 NOTE — Telephone Encounter (Signed)
 FYI Only or Action Required?: Action required by provider: clinical question for provider and update on patient condition.  Patient was last seen in primary care on 11/28/2023 by Vicci Barnie NOVAK, MD.  Called Nurse Triage reporting Dental Pain.  Symptoms began several days ago.  Interventions attempted: Rest, hydration, or home remedies.  Symptoms are: unchanged.  Triage Disposition: See HCP Within 4 Hours (Or PCP Triage)  Patient/caregiver understands and will follow disposition?: No, wishes to speak with PCP  Copied from CRM #[id number]. Topic: Clinical - Red Word Triage >> Feb 17, 2024  9:52 AM Rosina BIRCH wrote: Red Word that prompted transfer to Nurse Triage: patient is having tooth pain on her right side for two days. She made a dentist appointment for jan.7 but she is trying to get some relief before then Reason for Disposition  [1] SEVERE pain (e.g., excruciating, unable to eat, unable to do any normal activities) AND [2] not improved 2 hours after pain medicine  Answer Assessment - Initial Assessment Questions Patient reports right sided tooth pain-upper and lower teeth that are located in the back of mouth. Patient states tooth pain started a couple of days ago. Pain is currently 7-8 out of 10. Patient is scheduled to see her dentist on Jan 7th. Patient is asking for medication to be sent to her pharmacy. Patient is needing a call back today.   1. LOCATION: Which tooth is hurting?  (e.g., right-side/left-side, upper/lower, front/back)     Right side both upper and lower-back of mouth 2. ONSET: When did the toothache start?  (e.g., hours, days)      A couple of days ago 3. SEVERITY: How bad is the toothache?  (Scale 1-10; mild, moderate or severe)     7-8 out of 10 4. SWELLING: Is there any visible swelling of your face?     Patient is legally blind-no one lives in her house with her to see if the swelling is visible. Patient states she doesn't feel any swelling to her  face.  5. OTHER SYMPTOMS: Do you have any other symptoms? (e.g., fever)     no  Protocols used: Toothache-A-AH

## 2024-02-17 NOTE — Telephone Encounter (Signed)
 Recommend Extra Strength Tylenol  500 mg every 6 hrs as needed. Let me know if she thinks it is infected and I can send abx to her pharmacy.

## 2024-02-17 NOTE — Telephone Encounter (Signed)
 Please advise

## 2024-02-18 ENCOUNTER — Telehealth: Payer: Self-pay | Admitting: Internal Medicine

## 2024-02-18 NOTE — Telephone Encounter (Signed)
 Copied from CRM [id number]. Topic: General - Other >> Feb 18, 2024  2:26 PM Victoria B wrote:  Reason for CRM: Patient called in checking status of refill of amoxicillin 

## 2024-02-18 NOTE — Telephone Encounter (Signed)
 It looks like Amoxicillin  Rx has been refused, please advise.

## 2024-02-18 NOTE — Telephone Encounter (Signed)
 Called & spoke to the patient. Verified name & DOB. Informed that per Dr.Johnson to take Extra Strength Tylenol  every 6 hours as needed. Patient stated that she does believe it's infected due to the swelling & pain on the right side. Patient expressed verbal understanding of all discussed.

## 2024-02-19 ENCOUNTER — Other Ambulatory Visit: Payer: Self-pay | Admitting: Internal Medicine

## 2024-02-19 MED ORDER — AMOXICILLIN 500 MG PO CAPS: 500.0000 mg | ORAL_CAPSULE | Freq: Three times a day (TID) | ORAL | 0 refills | Status: AC

## 2024-02-19 NOTE — Telephone Encounter (Signed)
 Rxn sent to [facility] for Abx.

## 2024-02-19 NOTE — Telephone Encounter (Signed)
 Called & spoke to the pharmacy tech. He verified that amoxicillin  is ready for pick-up.   Called & spoke to the patient. Verified name & DOB. Informed that amoxicillin  is ready for pick-up. Patient expressed verbal understanding. No further assistance needed at this time.

## 2024-02-19 NOTE — Telephone Encounter (Signed)
 Called but no answer. LVM informing that a prescription was sent to her pharmacy.

## 2024-02-19 NOTE — Telephone Encounter (Signed)
 Called & spoke to the patient. Verified name & DOB. Informed that amoxicillin  is ready for pick-up. Patient expressed verbal understanding. No further assistance needed at this time.

## 2024-03-02 ENCOUNTER — Other Ambulatory Visit: Payer: Self-pay | Admitting: Internal Medicine

## 2024-03-03 NOTE — Telephone Encounter (Signed)
 Copied from CRM #[id number]. Topic: Clinical - Medication Question >> Mar 02, 2024  5:35 PM Dedra B wrote:  Reason for CRM: Pt would like Amoxicillin  500 mg sent to [facility]. She said the prescription she previously had was for 1 week but she needs to be on it 2 weeks for it to work.

## 2024-03-04 NOTE — Telephone Encounter (Signed)
 Noted! Thank you

## 2024-03-04 NOTE — Telephone Encounter (Signed)
 Contacted patient, Appointment scheduled for tomorrow at 11:10 am.

## 2024-03-04 NOTE — Telephone Encounter (Signed)
 One week should have been sufficient. If she feels she needs more, she will need to be seen to eval what is going on. Give appt with any available provider.

## 2024-03-05 ENCOUNTER — Ambulatory Visit: Attending: *Deleted | Admitting: *Deleted

## 2024-03-05 ENCOUNTER — Encounter: Payer: Self-pay | Admitting: *Deleted

## 2024-03-05 VITALS — BP 138/81 | HR 74 | Temp 98.7°F | Ht 68.0 in | Wt 210.2 lb

## 2024-03-05 DIAGNOSIS — K0889 Other specified disorders of teeth and supporting structures: Secondary | ICD-10-CM

## 2024-03-05 MED ORDER — AMOXICILLIN-POT CLAVULANATE 875-125 MG PO TABS: 1.0000 | ORAL_TABLET | Freq: Two times a day (BID) | ORAL | 0 refills | Status: DC

## 2024-03-05 NOTE — Progress Notes (Signed)
 Patient ID: Lisa Olson, female    DOB: 22-Aug-1960  MRN: [id number]  CC: No chief complaint on file.   Subjective: Lisa Olson is a 63 y.o. female who presents for persistent dental pain despite round of amoxicillin . She has pain in both upper and lower molars (tooth #1 and 32).  No swelling or drainage She has history of poor dentition with broken teeth mostly in the molar area. She does have an appointment with her dentist on January 7.  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, legally blind in both eyes, HTN, HL, CAD (medical management), ACD     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, associated with type 2 diabetes mellitus (HCC) 05/22/2018   Nuclear sclerotic cataract of both eyes 02/11/2018   Retinal edema 02/11/2018   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 02/11/2018   Vitreous hemorrhage, right eye (HCC) 02/11/2018   Legal blindness 07/25/2017   Type 2 diabetes mellitus with retinopathy of both eyes, with long-term current use of insulin (HCC) 02/14/2016   Essential hypertension 02/14/2016   Disorder of skin or subcutaneous tissue 04/24/2013   Onychomycosis due to dermatophyte 03/25/2013   Hallux valgus, acquired 03/25/2013     Medications Ordered Prior to  Encounter[1]  Allergies[2]  Social History   Socioeconomic History   Marital status: Single    Spouse name: Not on file   Number of children: Not on file   Years of education: Not on file   Highest education level: Not on file  Occupational History   Not on file  Tobacco Use   Smoking status: Never   Smokeless tobacco: Never  Vaping Use   Vaping status: Never Used  Substance and Sexual Activity   Alcohol use: Not Currently   Drug use: No   Sexual activity: Not on file  Other Topics Concern   Not on file  Social History Narrative   Are you right handed or left handed? Right Handed    Are you currently employed ? No    What is your current occupation? No    Do you live at home alone? Yes    Who lives with you?    What type of home do you live in: 1 story or 2 story? Lives in a one story home with a basement.        Social Drivers of Health   Tobacco Use: Low Risk (12/10/2023)   Patient History    Smoking Tobacco Use: Never    Smokeless Tobacco Use: Never    Passive Exposure: Not on file  Financial Resource Strain: Not on file  Food Insecurity: Food Insecurity Present (01/15/2022)   Hunger Vital Sign    Worried About Running Out of Food in the Last Year: Sometimes true    Ran Out of Food in the Last Year: Sometimes true  Transportation Needs: Unmet Transportation Needs (  01/15/2022)   PRAPARE - Administrator, Civil Service (Medical): Yes    Lack of Transportation (Non-Medical): Yes  Physical Activity: Not on file  Stress: Not on file  Social Connections: Moderately Integrated (04/12/2021)   Social Connection and Isolation Panel    Frequency of Communication with Friends and Family: More than three times a week    Frequency of Social Gatherings with Friends and Family: Once a week    Attends Religious Services: More than 4 times per year    Active Member of [employer] or Organizations: Yes    Attends Banker Meetings: More than 4 times per year     Marital Status: Separated  Intimate Partner Violence: Not on file  Depression (PHQ2-9): Low Risk (03/05/2024)   Depression (PHQ2-9)    PHQ-2 Score: 0  Alcohol Screen: Not on file  Housing: Low Risk (04/12/2021)   Housing    Last Housing Risk Score: 0  Utilities: Not on file  Health Literacy: Not on file    Family History  Problem Relation Age of Onset   Diabetes Mother    Diabetes Father    Hypertension Sister    BRCA 1/2 Neg Hx    Breast cancer Neg Hx     Past Surgical History:  Procedure Laterality Date   EYE SURGERY     LASIK     RETINAL DETACHMENT SURGERY      ROS: Review of Systems Negative except as stated above  PHYSICAL EXAM: BP 138/81 (BP Location: Left Arm, Patient Position: Sitting)   Pulse 74   Temp 98.7 F (37.1 C) (Oral)   Ht 5' 8 (1.727 m)   Wt 210 lb 3.2 oz (95.3 kg)   SpO2 99%   BMI 31.96 kg/m   Physical Exam Vitals and nursing note reviewed.  Constitutional:      Appearance: Normal appearance.  HENT:     Mouth/Throat:     Mouth: Mucous membranes are moist.     Pharynx: Oropharynx is clear.     Comments: Poor dentition in the molar area with broken teeth (tooth 1 and 32).  No gingival swelling or drainage noted Cardiovascular:     Rate and Rhythm: Normal rate and regular rhythm.  Pulmonary:     Effort: Pulmonary effort is normal.     Breath sounds: Normal breath sounds.  Skin:    General: Skin is warm and dry.  Neurological:     Mental Status: She is alert and oriented to person, place, and time. Mental status is at baseline.          Latest Ref Rng & Units 11/28/2023    3:13 PM 07/16/2022    8:39 PM 08/22/2021    4:55 PM  CMP  Glucose 70 - 99 mg/dL 66  850  847   BUN 8 - 27 mg/dL 16  15  15    Creatinine 0.57 - 1.00 mg/dL 8.93  9.07  9.12   Sodium 134 - 144 mmol/L 138  138  141   Potassium 3.5 - 5.2 mmol/L 4.4  4.1  4.1   Chloride 96 - 106 mmol/L 99  102  99   CO2 20 - 29 mmol/L 22  27  27    Calcium  8.7 - 10.3 mg/dL 9.5   8.8  9.5   Total Protein 6.0 - 8.5 g/dL 7.4  6.7  8.0   Total Bilirubin 0.0 - 1.2 mg/dL 0.3  0.5  0.3   Alkaline Phos 44 -  121 IU/L 43  48  66   AST 0 - 40 IU/L 19  19  15    ALT 0 - 32 IU/L 18  19  17     Lipid Panel     Component Value Date/Time   CHOL 146 11/28/2023 1513   TRIG 118 11/28/2023 1513   HDL 47 11/28/2023 1513   CHOLHDL 3.1 11/28/2023 1513   CHOLHDL 4.7 05/14/2016 1607   VLDL 44 (H) 05/14/2016 1607   LDLCALC 78 11/28/2023 1513    CBC    Component Value Date/Time   WBC 6.0 11/28/2023 1513   WBC 5.8 07/16/2022 2039   RBC 3.94 11/28/2023 1513   RBC 3.87 07/16/2022 2039   HGB 10.1 (L) 11/28/2023 1513   HCT 33.2 (L) 11/28/2023 1513   PLT 329 11/28/2023 1513   MCV 84 11/28/2023 1513   MCH 25.6 (L) 11/28/2023 1513   MCH 25.8 (L) 07/16/2022 2039   MCHC 30.4 (L) 11/28/2023 1513   MCHC 31.1 07/16/2022 2039   RDW 12.3 11/28/2023 1513   LYMPHSABS 2.3 07/16/2022 2039   MONOABS 0.6 07/16/2022 2039   EOSABS 0.1 07/16/2022 2039   BASOSABS 0.0 07/16/2022 2039    Results for orders placed or performed in visit on 11/28/23  POCT glucose (manual entry)   Collection Time: 11/28/23  2:07 PM  Result Value Ref Range   POC Glucose 93 70 - 99 mg/dl  POCT glycosylated hemoglobin (Hb A1C)   Collection Time: 11/28/23  2:13 PM  Result Value Ref Range   Hemoglobin A1C     HbA1c POC (<> result, manual entry)     HbA1c, POC (prediabetic range)     HbA1c, POC (controlled diabetic range) 6.6 0.0 - 7.0 %  CBC   Collection Time: 11/28/23  3:13 PM  Result Value Ref Range   WBC 6.0 3.4 - 10.8 x10E3/uL   RBC 3.94 3.77 - 5.28 x10E6/uL   Hemoglobin 10.1 (L) 11.1 - 15.9 g/dL   Hematocrit 66.7 (L) 65.9 - 46.6 %   MCV 84 79 - 97 fL   MCH 25.6 (L) 26.6 - 33.0 pg   MCHC 30.4 (L) 31.5 - 35.7 g/dL   RDW 87.6 88.2 - 84.5 %   Platelets 329 150 - 450 x10E3/uL  Comprehensive metabolic panel with GFR   Collection Time: 11/28/23  3:13 PM  Result Value Ref Range   Glucose 66 (L) 70 - 99  mg/dL   BUN 16 8 - 27 mg/dL   Creatinine, Ser 8.93 (H) 0.57 - 1.00 mg/dL   eGFR 59 (L) >40 fO/fpw/8.26   BUN/Creatinine Ratio 15 12 - 28   Sodium 138 134 - 144 mmol/L   Potassium 4.4 3.5 - 5.2 mmol/L   Chloride 99 96 - 106 mmol/L   CO2 22 20 - 29 mmol/L   Calcium  9.5 8.7 - 10.3 mg/dL   Total Protein 7.4 6.0 - 8.5 g/dL   Albumin 4.1 3.9 - 4.9 g/dL   Globulin, Total 3.3 1.5 - 4.5 g/dL   Bilirubin Total 0.3 0.0 - 1.2 mg/dL   Alkaline Phosphatase 43 (L) 44 - 121 IU/L   AST 19 0 - 40 IU/L   ALT 18 0 - 32 IU/L  Lipid panel   Collection Time: 11/28/23  3:13 PM  Result Value Ref Range   Cholesterol, Total 146 100 - 199 mg/dL   Triglycerides 881 0 - 149 mg/dL   HDL 47 >60 mg/dL   VLDL Cholesterol Cal 21 5 - 40  mg/dL   LDL Chol Calc (NIH) 78 0 - 99 mg/dL   Chol/HDL Ratio 3.1 0.0 - 4.4 ratio  Microalbumin / creatinine urine ratio   Collection Time: 11/28/23  3:13 PM  Result Value Ref Range   Creatinine, Urine 119.9 Not Estab. mg/dL   Microalbumin, Urine 5.0 Not Estab. ug/mL   Microalb/Creat Ratio 4 0 - 29 mg/g creat     ASSESSMENT AND PLAN:  Assessment & Plan Tooth pain persistent dental pain despite round of amoxicillin . She has pain in both upper and lower molars (tooth #1 and 32).  No gingival swelling or drainage She has history of poor dentition with broken teeth mostly in the molar area. She does have an appointment with her dentist on January 7. She has done well with Augmentin  in the past Will try a round of Augmentin . She is encouraged to get on the cancellation list at the dentist Notify the clinic if symptoms persist or worsen or seek help at the urgent care/ER Orders:   amoxicillin -clavulanate (AUGMENTIN ) 875-125 MG tablet; Take 1 tablet by mouth 2 (two) times daily.       Patient was given the opportunity to ask questions.  Patient verbalized understanding of the plan and was able to repeat key elements of the plan.   This documentation was completed using  Paediatric nurse.  Any transcriptional errors are unintentional.     Requested Prescriptions   Signed Prescriptions Disp Refills   amoxicillin -clavulanate (AUGMENTIN ) 875-125 MG tablet 20 tablet 0    Sig: Take 1 tablet by mouth 2 (two) times daily.    No follow-ups on file.  Orland Visconti H, NP      [1]  Current Outpatient Medications on File Prior to Visit  Medication Sig Dispense Refill   ACCU-CHEK GUIDE TEST test strip USE 1  THREE TIMES DAILY AS DIRECTED 100 each 0   Accu-Chek Softclix Lancets lancets Use as instructed 3 times a day  before meals 100 each 12   acetaminophen -codeine  (TYLENOL  #3) 300-30 MG tablet Take 1 tablet by mouth every 8 (eight) hours as needed for moderate pain. 15 tablet 0   Ascorbic Acid (VITAMIN C ER PO) Take by mouth.     Blood Glucose Monitoring Suppl (ACCU-CHEK GUIDE ME) w/Device KIT Use to check blood sugar once daily. E11.69 1 kit 0   Blood Glucose Monitoring Suppl (ACCU-CHEK GUIDE) w/Device KIT Use as directed 3 times a day 1 kit 0   carvedilol  (COREG ) 6.25 MG tablet Take 1 tablet (6.25 mg total) by mouth 2 (two) times daily with a meal. 180 tablet 3   ferrous sulfate  325 (65 FE) MG tablet Take 1 tablet (325 mg total) by mouth daily with breakfast. 100 tablet 3   furosemide  (LASIX ) 20 MG tablet Take 1 tablet (20 mg total) by mouth daily. Stop Hydrochlorothiazide  90 tablet 2   glimepiride  (AMARYL ) 4 MG tablet Take 1 tablet (4 mg total) by mouth 2 (two) times daily. 180 tablet 1   ibuprofen  (ADVIL ) 800 MG tablet Take 1 tablet (800 mg total) by mouth 3 (three) times daily. 21 tablet 0   isosorbide  mononitrate (IMDUR ) 30 MG 24 hr tablet Take 1 tablet (30 mg total) by mouth daily. 90 tablet 3   lisinopril  (ZESTRIL ) 30 MG tablet Take 1 tablet (30 mg total) by mouth daily. 90 tablet 1   metFORMIN  (GLUCOPHAGE ) 1000 MG tablet Take 1 tablet (1,000 mg total) by mouth 2 (two) times daily with a meal. 180 tablet 0  Multiple Vitamins-Minerals  (MULTIVITAMIN WITH MINERALS) tablet Take 1 tablet by mouth daily.     rosuvastatin  (CRESTOR ) 10 MG tablet Take 1 tablet (10 mg total) by mouth daily. 90 tablet 3   No current facility-administered medications on file prior to visit.  [2] No Known Allergies

## 2024-03-05 NOTE — Patient Instructions (Signed)
 We discussed your tooth pain associated with poor dentition. You have used Augmentin  in the past with good results. Will try a round of Augmentin  875 twice daily 1 week. Call dentist and get on cancellation list. Keep appointment with dentist

## 2024-03-30 ENCOUNTER — Encounter: Payer: Self-pay | Admitting: Internal Medicine

## 2024-03-30 ENCOUNTER — Ambulatory Visit: Attending: Internal Medicine | Admitting: Internal Medicine

## 2024-03-30 VITALS — BP 130/77 | HR 73 | Temp 98.3°F | Ht 68.0 in | Wt 212.0 lb

## 2024-03-30 DIAGNOSIS — D649 Anemia, unspecified: Secondary | ICD-10-CM | POA: Diagnosis not present

## 2024-03-30 DIAGNOSIS — Z7984 Long term (current) use of oral hypoglycemic drugs: Secondary | ICD-10-CM | POA: Diagnosis not present

## 2024-03-30 DIAGNOSIS — I2510 Atherosclerotic heart disease of native coronary artery without angina pectoris: Secondary | ICD-10-CM

## 2024-03-30 DIAGNOSIS — I10 Essential (primary) hypertension: Secondary | ICD-10-CM

## 2024-03-30 DIAGNOSIS — E785 Hyperlipidemia, unspecified: Secondary | ICD-10-CM

## 2024-03-30 DIAGNOSIS — E118 Type 2 diabetes mellitus with unspecified complications: Secondary | ICD-10-CM

## 2024-03-30 DIAGNOSIS — E1169 Type 2 diabetes mellitus with other specified complication: Secondary | ICD-10-CM

## 2024-03-30 DIAGNOSIS — E1159 Type 2 diabetes mellitus with other circulatory complications: Secondary | ICD-10-CM

## 2024-03-30 LAB — POCT GLYCOSYLATED HEMOGLOBIN (HGB A1C): HbA1c, POC (controlled diabetic range): 6.9 % (ref 0.0–7.0)

## 2024-03-30 LAB — GLUCOSE, POCT (MANUAL RESULT ENTRY): POC Glucose: 185 mg/dL — AB (ref 70–99)

## 2024-03-30 MED ORDER — ACCU-CHEK GUIDE W/DEVICE KIT: PACK | 0 refills | Status: DC

## 2024-03-30 MED ORDER — GLIMEPIRIDE 4 MG PO TABS: 4.0000 mg | ORAL_TABLET | Freq: Two times a day (BID) | ORAL | 1 refills | Status: AC

## 2024-03-30 MED ORDER — METFORMIN HCL 1000 MG PO TABS: 1000.0000 mg | ORAL_TABLET | Freq: Two times a day (BID) | ORAL | 0 refills | Status: AC

## 2024-03-30 NOTE — Patient Instructions (Signed)
" °  VISIT SUMMARY: Lisa Olson, you had a follow-up appointment today to review your chronic medical conditions. Your diabetes is well-managed with an A1c of 6.9, though you have experienced occasional low blood sugar levels at night. Your blood pressure and cholesterol levels are under control, and your chronic anemia remains stable. You are focusing on maintaining a healthy diet and taking your prescribed medications.  YOUR PLAN: -TYPE 2 DIABETES MELLITUS WITH CIRCULATORY AND LIPID COMPLICATIONS: Type 2 diabetes is a condition where your body does not use insulin properly, leading to high blood sugar levels. Your A1c is at 6.9, which is within the target range. You are experiencing occasional low blood sugar levels at night, likely due to not eating enough carbohydrates before bed. Continue taking metformin  1000 mg twice daily and glimepiride  4 mg in the morning. If you experience low blood sugar at night, reduce glimepiride  to 2 mg in the evening. A new Accu-Chek glucometer and test strips have been ordered for you. Avoid taking the neuropathy supplement as it overlaps with your multivitamin. Keep managing your diet by reducing carbohydrates and increasing protein.  -CORONARY ARTERY DISEASE: Coronary artery disease is a condition where the blood vessels supplying your heart are narrowed or blocked. You are not experiencing chest pain or shortness of breath, and your blood pressure is well-controlled. Continue taking carvedilol  6.25 mg twice daily, isosorbide  30 mg daily, lisinopril  30 mg daily, rosuvastatin  10 mg daily, and furosemide  120 mg daily. Fenofibrate has been removed from your medication list as your triglyceride levels are good.  -CHRONIC ANEMIA: Chronic anemia is a condition where you have a lower than normal number of red blood cells. Your hemoglobin level is stable at 10, and you are managing it with an over-the-counter iron supplement. Continue taking your iron supplement as directed.  -GENERAL  HEALTH MAINTENANCE: You declined the flu and pneumonia vaccines. Your recent eye exam was stable. Continue with your routine health maintenance and monitoring.  INSTRUCTIONS: Follow up with your doctor as scheduled and continue monitoring your blood sugar levels. If you experience any new symptoms or have concerns, contact your healthcare provider.                      Contains text generated by [employer].                                 Contains text generated by [employer].   "

## 2024-03-30 NOTE — Progress Notes (Signed)
 "   Patient ID: Lisa Olson, female    DOB: May 04, 1960  MRN: [id number]  CC: Diabetes (Dm f/u./Discuss if neuropathy nerve support is ok to take/Requesting new DM meter/No to flu vax)   Subjective: Lisa Olson is a 64 y.o. female who presents for chronic ds management. 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  History of Present Illness Lisa Olson is a 64 year old female who presents for follow-up of her chronic medical conditions.  DM: She is managing her diabetes with an A1c of 6.9. Blood sugar readings are generally in the mid to low 100s, with occasional lows, the lowest being 58, occurring at night. These lows are attributed to insufficient carbohydrate intake before bed. She checks her blood sugar three times a day and notes that her meter malfunctions frequently. She is currently taking metformin  1000 mg twice daily and glimepiride  4 mg twice daily. Checks BS 1-3X/day.  I was able to review her blood sugar log.  With readings ranging from 117-152.  Lowest was 58.  She had 2 lows 1 of 58 and 168. She is trying to maintain a healthy diet, focusing on eating clean with less carbohydrates and more protein and salads. She also takes a One-A-Day woman's multivitamin.  She recently purchased another supplement for neuropathy symptoms.  She has the unopened package with her today.  It contains mainly B vitamins along with vitamin D , vitamin C and others.  HTN: Her hypertension and hyperlipidemia are managed with carvedilol  6.25 mg twice daily, isosorbide  30 mg daily, lisinopril  30 mg daily, rosuvastatin  10 mg daily, and furosemide  120 mg daily. She does not take fenofibrate but has an old bottle with her.  She confirms that she is not  taking this.  No chest pain, shortness of breath, or leg swelling.  She has a history of mild chronic anemia, for which she takes an over-the-counter iron supplement. Her hemoglobin was stable at 10 during her last check in September.      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, associated with type 2 diabetes mellitus (HCC) 05/22/2018   Nuclear sclerotic cataract of both eyes 02/11/2018   Retinal edema 02/11/2018   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 02/11/2018   Vitreous hemorrhage, right eye (HCC) 02/11/2018   Legal blindness 07/25/2017   Type 2 diabetes mellitus with retinopathy of both eyes, with long-term current use of insulin (HCC) 02/14/2016   Essential hypertension 02/14/2016   Disorder of skin or subcutaneous tissue 04/24/2013   Onychomycosis due to dermatophyte 03/25/2013   Hallux valgus, acquired 03/25/2013     Medications Ordered Prior to Encounter[1]  Allergies[2]  Social History   Socioeconomic History  Marital status: Single    Spouse name: Not on file   Number of children: Not on file   Years of education: Not on file   Highest education level: Not on file  Occupational History   Not on file  Tobacco Use   Smoking status: Never   Smokeless tobacco: Never  Vaping Use   Vaping status: Never Used  Substance and Sexual  Activity   Alcohol use: Not Currently   Drug use: No   Sexual activity: Not on file  Other Topics Concern   Not on file  Social History Narrative   Are you right handed or left handed? Right Handed    Are you currently employed ? No    What is your current occupation? No    Do you live at home alone? Yes    Who lives with you?    What type of home do you live in: 1 story or 2 story? Lives in a one story home with a basement.        Social Drivers of Health   Tobacco Use: Low Risk (03/05/2024)   Patient History    Smoking Tobacco Use: Never    Smokeless Tobacco Use: Never    Passive Exposure: Not on file  Financial Resource Strain: Not on file  Food Insecurity: Food Insecurity Present (01/15/2022)   Hunger Vital Sign    Worried About Running Out of Food in the Last Year: Sometimes true    Ran Out of Food in the Last Year: Sometimes true  Transportation Needs: Unmet Transportation Needs (01/15/2022)   PRAPARE - Administrator, Civil Service (Medical): Yes    Lack of Transportation (Non-Medical): Yes  Physical Activity: Not on file  Stress: Not on file  Social Connections: Moderately Integrated (04/12/2021)   Social Connection and Isolation Panel    Frequency of Communication with Friends and Family: More than three times a week    Frequency of Social Gatherings with Friends and Family: Once a week    Attends Religious Services: More than 4 times per year    Active Member of [employer] or Organizations: Yes    Attends Banker Meetings: More than 4 times per year    Marital Status: Separated  Intimate Partner Violence: Not on file  Depression (PHQ2-9): Low Risk (03/05/2024)   Depression (PHQ2-9)    PHQ-2 Score: 0  Alcohol Screen: Not on file  Housing: Low Risk (04/12/2021)   Housing    Last Housing Risk Score: 0  Utilities: Not on file  Health Literacy: Not on file    Family History  Problem Relation Age of Onset   Diabetes Mother    Diabetes  Father    Hypertension Sister    BRCA 1/2 Neg Hx    Breast cancer Neg Hx     Past Surgical History:  Procedure Laterality Date   EYE SURGERY     LASIK     RETINAL DETACHMENT SURGERY      ROS: Review of Systems Negative except as stated above  PHYSICAL EXAM: BP 130/77 (BP Location: Left Arm, Patient Position: Sitting, Cuff Size: Large)   Pulse 73   Temp 98.3 F (36.8 C) (Oral)   Ht 5' 8 (1.727 m)   Wt 212 lb (96.2 kg)   SpO2 99%   BMI 32.23 kg/m   Physical Exam  General appearance - alert, well appearing, and in no distress Mental status - normal mood, behavior, speech, dress, motor activity,  and thought processes Neck - supple, no significant adenopathy Chest - clear to auscultation, no wheezes, rales or rhonchi, symmetric air entry Heart - normal rate, regular rhythm, normal S1, S2, no murmurs, rubs, clicks or gallops Extremities - peripheral pulses normal, no pedal edema, no clubbing or cyanosis Diabetic Foot Exam - Simple   Simple Foot Form Diabetic Foot exam was performed with the following findings: Yes 03/30/2024  4:04 PM  Visual Inspection See comments: Yes Sensation Testing Intact to touch and monofilament testing bilaterally: Yes Pulse Check Posterior Tibialis and Dorsalis pulse intact bilaterally: Yes Comments Toenails thick and discolored. 2 cm callous lateral aspect RT foot plantar surface    Ambulates with stick for the blind.      Latest Ref Rng & Units 11/28/2023    3:13 PM 07/16/2022    8:39 PM 08/22/2021    4:55 PM  CMP  Glucose 70 - 99 mg/dL 66  850  847   BUN 8 - 27 mg/dL 16  15  15    Creatinine 0.57 - 1.00 mg/dL 8.93  9.07  9.12   Sodium 134 - 144 mmol/L 138  138  141   Potassium 3.5 - 5.2 mmol/L 4.4  4.1  4.1   Chloride 96 - 106 mmol/L 99  102  99   CO2 20 - 29 mmol/L 22  27  27    Calcium  8.7 - 10.3 mg/dL 9.5  8.8  9.5   Total Protein 6.0 - 8.5 g/dL 7.4  6.7  8.0   Total Bilirubin 0.0 - 1.2 mg/dL 0.3  0.5  0.3   Alkaline Phos 44 -  121 IU/L 43  48  66   AST 0 - 40 IU/L 19  19  15    ALT 0 - 32 IU/L 18  19  17     Lipid Panel     Component Value Date/Time   CHOL 146 11/28/2023 1513   TRIG 118 11/28/2023 1513   HDL 47 11/28/2023 1513   CHOLHDL 3.1 11/28/2023 1513   CHOLHDL 4.7 05/14/2016 1607   VLDL 44 (H) 05/14/2016 1607   LDLCALC 78 11/28/2023 1513    CBC    Component Value Date/Time   WBC 6.0 11/28/2023 1513   WBC 5.8 07/16/2022 2039   RBC 3.94 11/28/2023 1513   RBC 3.87 07/16/2022 2039   HGB 10.1 (L) 11/28/2023 1513   HCT 33.2 (L) 11/28/2023 1513   PLT 329 11/28/2023 1513   MCV 84 11/28/2023 1513   MCH 25.6 (L) 11/28/2023 1513   MCH 25.8 (L) 07/16/2022 2039   MCHC 30.4 (L) 11/28/2023 1513   MCHC 31.1 07/16/2022 2039   RDW 12.3 11/28/2023 1513   LYMPHSABS 2.3 07/16/2022 2039   MONOABS 0.6 07/16/2022 2039   EOSABS 0.1 07/16/2022 2039   BASOSABS 0.0 07/16/2022 2039    ASSESSMENT AND PLAN: 1. Controlled type 2 diabetes mellitus with complication, without long-term current use of insulin (HCC) (Primary) At goal.  Patient to continue metformin  at 1000 mg twice a day.  Continue Amaryl  4 mg twice a day.  However if she note frequent low blood sugars particularly at nights, she should cut back the evening dose of the Amaryl  to 2 mg. - POCT glucose (manual entry) - POCT glycosylated hemoglobin (Hb A1C) - glimepiride  (AMARYL ) 4 MG tablet; Take 1 tablet (4 mg total) by mouth 2 (two) times daily.  Dispense: 180 tablet; Refill: 1 - metFORMIN  (GLUCOPHAGE ) 1000 MG tablet; Take 1 tablet (1,000 mg total) by mouth  2 (two) times daily with a meal.  Dispense: 180 tablet; Refill: 0 - Blood Glucose Monitoring Suppl (ACCU-CHEK GUIDE) w/Device KIT; Use to check BS twice a day  Dispense: 1 kit; Refill: 0  2. Coronary artery disease involving native coronary artery of native heart without angina pectoris Stable.  Continue carvedilol  and rosuvastatin .  3. Hypertension associated with type 2 diabetes mellitus (HCC) Close  to goal. Continue lisinopril  30 mg daily, carvedilol  6.25 mg twice a day, furosemide  20 mg daily and isosorbide  30 mg daily.   4. Hyperlipidemia associated with type 2 diabetes mellitus (HCC) Continue rosuvastatin .  5. Chronic anemia Stable.  Continue iron supplement daily.  In regards to the additional supplement that she purchased for neuropathy I advised that she not take it as her One-A-Day woman's vitamin contains in the same vitamins.     Patient was given the opportunity to ask questions.  Patient verbalized understanding of the plan and was able to repeat key elements of the plan.   This documentation was completed using Paediatric nurse.  Any transcriptional errors are unintentional.  Orders Placed This Encounter  Procedures   POCT glucose (manual entry)   POCT glycosylated hemoglobin (Hb A1C)     Requested Prescriptions   Signed Prescriptions Disp Refills   glimepiride  (AMARYL ) 4 MG tablet 180 tablet 1    Sig: Take 1 tablet (4 mg total) by mouth 2 (two) times daily.   metFORMIN  (GLUCOPHAGE ) 1000 MG tablet 180 tablet 0    Sig: Take 1 tablet (1,000 mg total) by mouth 2 (two) times daily with a meal.   Blood Glucose Monitoring Suppl (ACCU-CHEK GUIDE) w/Device KIT 1 kit 0    Sig: Use to check BS twice a day    Return in about 4 months (around 07/28/2024).  Barnie Louder, MD, FACP     [1]  Current Outpatient Medications on File Prior to Visit  Medication Sig Dispense Refill   ACCU-CHEK GUIDE TEST test strip USE 1  THREE TIMES DAILY AS DIRECTED 100 each 0   Accu-Chek Softclix Lancets lancets Use as instructed 3 times a day  before meals 100 each 12   Ascorbic Acid (VITAMIN C ER PO) Take by mouth.     carvedilol  (COREG ) 6.25 MG tablet Take 1 tablet (6.25 mg total) by mouth 2 (two) times daily with a meal. 180 tablet 3   ferrous sulfate  325 (65 FE) MG tablet Take 1 tablet (325 mg total) by mouth daily with breakfast. 100 tablet 3   furosemide   (LASIX ) 20 MG tablet Take 1 tablet (20 mg total) by mouth daily. Stop Hydrochlorothiazide  90 tablet 2   ibuprofen  (ADVIL ) 800 MG tablet Take 1 tablet (800 mg total) by mouth 3 (three) times daily. 21 tablet 0   isosorbide  mononitrate (IMDUR ) 30 MG 24 hr tablet Take 1 tablet (30 mg total) by mouth daily. 90 tablet 3   lisinopril  (ZESTRIL ) 30 MG tablet Take 1 tablet (30 mg total) by mouth daily. 90 tablet 1   Multiple Vitamins-Minerals (MULTIVITAMIN WITH MINERALS) tablet Take 1 tablet by mouth daily.     rosuvastatin  (CRESTOR ) 10 MG tablet Take 1 tablet (10 mg total) by mouth daily. 90 tablet 3   No current facility-administered medications on file prior to visit.  [2] No Known Allergies  "

## 2024-03-31 ENCOUNTER — Telehealth: Payer: Self-pay | Admitting: Internal Medicine

## 2024-03-31 ENCOUNTER — Other Ambulatory Visit (HOSPITAL_BASED_OUTPATIENT_CLINIC_OR_DEPARTMENT_OTHER): Payer: Self-pay | Admitting: Cardiology

## 2024-03-31 ENCOUNTER — Other Ambulatory Visit: Payer: Self-pay | Admitting: Internal Medicine

## 2024-03-31 DIAGNOSIS — I152 Hypertension secondary to endocrine disorders: Secondary | ICD-10-CM

## 2024-03-31 DIAGNOSIS — I2510 Atherosclerotic heart disease of native coronary artery without angina pectoris: Secondary | ICD-10-CM

## 2024-03-31 DIAGNOSIS — I10 Essential (primary) hypertension: Secondary | ICD-10-CM

## 2024-03-31 DIAGNOSIS — E118 Type 2 diabetes mellitus with unspecified complications: Secondary | ICD-10-CM

## 2024-03-31 MED ORDER — ACCU-CHEK GUIDE W/DEVICE KIT: PACK | 0 refills | Status: AC

## 2024-03-31 MED ORDER — ACCU-CHEK GUIDE TEST VI STRP: ORAL_STRIP | 12 refills | Status: DC

## 2024-03-31 NOTE — Telephone Encounter (Signed)
 Copied from CRM [id number]. Topic: Clinical - Medication Question >> Mar 31, 2024  2:00 PM   Winona R wrote:  [facility] on the line with the pt calling to request the script for Blood Glucose Monitoring Suppl (ACCU-CHEK GUIDE) w/Device KIT [[id number]] be sent to [facility] care [facility]: [phone] or [facility] [employer] ph: [phone] Fax number was not available for either companies.  [facility] informed the pt it is not covered with them.

## 2024-03-31 NOTE — Telephone Encounter (Signed)
 Sent!

## 2024-03-31 NOTE — Telephone Encounter (Signed)
 Called & spoke to the patient. Verified name & DOB. Informed that the BS meter was sent to [facility]. Patient expressed verbal understanding.

## 2024-03-31 NOTE — Addendum Note (Signed)
 Addended by: VICCI SOBER B on: 03/31/2024 04:50 PM   Modules accepted: Orders

## 2024-07-28 ENCOUNTER — Ambulatory Visit: Payer: Self-pay | Admitting: Internal Medicine

## 2024-12-02 ENCOUNTER — Encounter (INDEPENDENT_AMBULATORY_CARE_PROVIDER_SITE_OTHER): Admitting: Ophthalmology
